# Patient Record
Sex: Male | Born: 1935 | Race: Black or African American | Hispanic: No | Marital: Single | Smoking: Former smoker
Health system: Southern US, Community
[De-identification: ages and names within clinical notes are randomized; demographics above are authoritative.]

## PROBLEM LIST (undated history)

## (undated) ENCOUNTER — Emergency Department (HOSPITAL_COMMUNITY): Admission: EM | Payer: Medicare Other | Source: Home / Self Care

## (undated) DIAGNOSIS — I82409 Acute embolism and thrombosis of unspecified deep veins of unspecified lower extremity: Secondary | ICD-10-CM

## (undated) DIAGNOSIS — I2510 Atherosclerotic heart disease of native coronary artery without angina pectoris: Secondary | ICD-10-CM

## (undated) DIAGNOSIS — R011 Cardiac murmur, unspecified: Secondary | ICD-10-CM

## (undated) DIAGNOSIS — R569 Unspecified convulsions: Secondary | ICD-10-CM

## (undated) DIAGNOSIS — L80 Vitiligo: Secondary | ICD-10-CM

## (undated) DIAGNOSIS — N400 Enlarged prostate without lower urinary tract symptoms: Secondary | ICD-10-CM

## (undated) DIAGNOSIS — Z8669 Personal history of other diseases of the nervous system and sense organs: Secondary | ICD-10-CM

## (undated) DIAGNOSIS — N179 Acute kidney failure, unspecified: Secondary | ICD-10-CM

## (undated) DIAGNOSIS — R197 Diarrhea, unspecified: Secondary | ICD-10-CM

## (undated) DIAGNOSIS — E876 Hypokalemia: Secondary | ICD-10-CM

## (undated) DIAGNOSIS — K449 Diaphragmatic hernia without obstruction or gangrene: Secondary | ICD-10-CM

## (undated) DIAGNOSIS — I4891 Unspecified atrial fibrillation: Secondary | ICD-10-CM

## (undated) DIAGNOSIS — R112 Nausea with vomiting, unspecified: Secondary | ICD-10-CM

## (undated) DIAGNOSIS — F0390 Unspecified dementia without behavioral disturbance: Secondary | ICD-10-CM

## (undated) DIAGNOSIS — D649 Anemia, unspecified: Secondary | ICD-10-CM

## (undated) DIAGNOSIS — I509 Heart failure, unspecified: Secondary | ICD-10-CM

## (undated) DIAGNOSIS — M1990 Unspecified osteoarthritis, unspecified site: Secondary | ICD-10-CM

## (undated) DIAGNOSIS — I10 Essential (primary) hypertension: Secondary | ICD-10-CM

## (undated) HISTORY — DX: Nausea with vomiting, unspecified: R11.2

## (undated) HISTORY — DX: Unspecified osteoarthritis, unspecified site: M19.90

## (undated) HISTORY — DX: Diarrhea, unspecified: R19.7

## (undated) HISTORY — DX: Cardiac murmur, unspecified: R01.1

## (undated) HISTORY — DX: Hypokalemia: E87.6

## (undated) HISTORY — DX: Acute embolism and thrombosis of unspecified deep veins of unspecified lower extremity: I82.409

## (undated) HISTORY — DX: Anemia, unspecified: D64.9

## (undated) HISTORY — DX: Unspecified convulsions: R56.9

## (undated) HISTORY — PX: HERNIA REPAIR: SHX51

## (undated) HISTORY — DX: Diaphragmatic hernia without obstruction or gangrene: K44.9

## (undated) HISTORY — DX: Unspecified atrial fibrillation: I48.91

## (undated) HISTORY — DX: Acute kidney failure, unspecified: N17.9

## (undated) HISTORY — DX: Personal history of other diseases of the nervous system and sense organs: Z86.69

## (undated) HISTORY — DX: Vitiligo: L80

---

## 1999-03-27 ENCOUNTER — Encounter: Payer: Self-pay | Admitting: Nephrology

## 1999-03-27 ENCOUNTER — Encounter: Admission: RE | Admit: 1999-03-27 | Discharge: 1999-03-27 | Payer: Self-pay | Admitting: Nephrology

## 2001-04-16 DIAGNOSIS — K449 Diaphragmatic hernia without obstruction or gangrene: Secondary | ICD-10-CM

## 2001-04-16 HISTORY — DX: Diaphragmatic hernia without obstruction or gangrene: K44.9

## 2003-12-29 ENCOUNTER — Ambulatory Visit: Payer: Self-pay | Admitting: Family Medicine

## 2004-02-01 ENCOUNTER — Ambulatory Visit: Payer: Self-pay | Admitting: Family Medicine

## 2004-03-06 ENCOUNTER — Ambulatory Visit: Payer: Self-pay | Admitting: Family Medicine

## 2004-04-06 ENCOUNTER — Ambulatory Visit: Payer: Self-pay | Admitting: Family Medicine

## 2004-05-08 ENCOUNTER — Ambulatory Visit: Payer: Self-pay | Admitting: Family Medicine

## 2004-05-15 ENCOUNTER — Ambulatory Visit: Payer: Self-pay | Admitting: Family Medicine

## 2004-06-08 ENCOUNTER — Ambulatory Visit: Payer: Self-pay | Admitting: Family Medicine

## 2004-07-07 ENCOUNTER — Ambulatory Visit: Payer: Self-pay | Admitting: Family Medicine

## 2004-08-08 ENCOUNTER — Ambulatory Visit: Payer: Self-pay | Admitting: Internal Medicine

## 2004-09-07 ENCOUNTER — Ambulatory Visit: Payer: Self-pay | Admitting: Internal Medicine

## 2004-10-16 ENCOUNTER — Ambulatory Visit: Payer: Self-pay | Admitting: Internal Medicine

## 2004-11-16 ENCOUNTER — Ambulatory Visit: Payer: Self-pay | Admitting: Family Medicine

## 2004-12-21 ENCOUNTER — Ambulatory Visit: Payer: Self-pay | Admitting: Internal Medicine

## 2005-01-22 ENCOUNTER — Ambulatory Visit: Payer: Self-pay | Admitting: Family Medicine

## 2005-02-23 ENCOUNTER — Ambulatory Visit: Payer: Self-pay | Admitting: Family Medicine

## 2005-03-28 ENCOUNTER — Ambulatory Visit: Payer: Self-pay | Admitting: Internal Medicine

## 2005-04-30 ENCOUNTER — Ambulatory Visit: Payer: Self-pay | Admitting: Family Medicine

## 2005-05-17 ENCOUNTER — Encounter (INDEPENDENT_AMBULATORY_CARE_PROVIDER_SITE_OTHER): Payer: Self-pay | Admitting: Internal Medicine

## 2005-05-17 LAB — CONVERTED CEMR LAB: Microalbumin U total vol: NORMAL mg/L

## 2005-05-31 ENCOUNTER — Ambulatory Visit: Payer: Self-pay | Admitting: Family Medicine

## 2005-06-11 ENCOUNTER — Ambulatory Visit: Payer: Self-pay | Admitting: Family Medicine

## 2005-07-02 ENCOUNTER — Ambulatory Visit: Payer: Self-pay | Admitting: Family Medicine

## 2005-07-16 ENCOUNTER — Encounter (INDEPENDENT_AMBULATORY_CARE_PROVIDER_SITE_OTHER): Payer: Self-pay | Admitting: Internal Medicine

## 2005-08-02 ENCOUNTER — Ambulatory Visit: Payer: Self-pay | Admitting: Family Medicine

## 2005-09-04 ENCOUNTER — Ambulatory Visit: Payer: Self-pay | Admitting: Family Medicine

## 2005-10-04 ENCOUNTER — Ambulatory Visit: Payer: Self-pay | Admitting: Family Medicine

## 2005-11-02 ENCOUNTER — Ambulatory Visit: Payer: Self-pay | Admitting: Family Medicine

## 2005-11-09 ENCOUNTER — Ambulatory Visit: Payer: Self-pay | Admitting: Family Medicine

## 2005-12-06 ENCOUNTER — Ambulatory Visit: Payer: Self-pay | Admitting: Family Medicine

## 2006-01-07 ENCOUNTER — Ambulatory Visit: Payer: Self-pay | Admitting: Internal Medicine

## 2006-02-06 ENCOUNTER — Ambulatory Visit: Payer: Self-pay | Admitting: Family Medicine

## 2006-03-13 ENCOUNTER — Ambulatory Visit: Payer: Self-pay | Admitting: Family Medicine

## 2006-04-11 ENCOUNTER — Ambulatory Visit: Payer: Self-pay | Admitting: Internal Medicine

## 2006-05-13 ENCOUNTER — Ambulatory Visit: Payer: Self-pay | Admitting: Family Medicine

## 2006-06-13 ENCOUNTER — Ambulatory Visit: Payer: Self-pay | Admitting: Family Medicine

## 2006-07-15 ENCOUNTER — Ambulatory Visit: Payer: Self-pay | Admitting: Family Medicine

## 2006-08-12 ENCOUNTER — Ambulatory Visit: Payer: Self-pay | Admitting: Gastroenterology

## 2006-08-13 ENCOUNTER — Encounter (INDEPENDENT_AMBULATORY_CARE_PROVIDER_SITE_OTHER): Payer: Self-pay | Admitting: Internal Medicine

## 2006-08-13 DIAGNOSIS — L80 Vitiligo: Secondary | ICD-10-CM | POA: Insufficient documentation

## 2006-08-13 DIAGNOSIS — E785 Hyperlipidemia, unspecified: Secondary | ICD-10-CM

## 2006-08-13 DIAGNOSIS — D649 Anemia, unspecified: Secondary | ICD-10-CM | POA: Insufficient documentation

## 2006-08-13 DIAGNOSIS — K219 Gastro-esophageal reflux disease without esophagitis: Secondary | ICD-10-CM

## 2006-08-13 DIAGNOSIS — I10 Essential (primary) hypertension: Secondary | ICD-10-CM

## 2006-08-13 DIAGNOSIS — D518 Other vitamin B12 deficiency anemias: Secondary | ICD-10-CM | POA: Insufficient documentation

## 2006-08-13 DIAGNOSIS — G40909 Epilepsy, unspecified, not intractable, without status epilepticus: Secondary | ICD-10-CM

## 2006-08-14 ENCOUNTER — Ambulatory Visit: Payer: Self-pay | Admitting: Family Medicine

## 2006-08-21 ENCOUNTER — Ambulatory Visit: Payer: Self-pay | Admitting: Gastroenterology

## 2006-09-12 ENCOUNTER — Ambulatory Visit: Payer: Self-pay | Admitting: Family Medicine

## 2006-09-30 ENCOUNTER — Encounter (INDEPENDENT_AMBULATORY_CARE_PROVIDER_SITE_OTHER): Payer: Self-pay | Admitting: Family Medicine

## 2006-10-14 ENCOUNTER — Ambulatory Visit: Payer: Self-pay | Admitting: Internal Medicine

## 2006-11-13 ENCOUNTER — Ambulatory Visit: Payer: Self-pay | Admitting: Family Medicine

## 2006-12-02 ENCOUNTER — Ambulatory Visit: Payer: Self-pay | Admitting: Family Medicine

## 2006-12-03 ENCOUNTER — Encounter (INDEPENDENT_AMBULATORY_CARE_PROVIDER_SITE_OTHER): Payer: Self-pay | Admitting: Family Medicine

## 2006-12-03 LAB — CONVERTED CEMR LAB
Alkaline Phosphatase: 128 units/L — ABNORMAL HIGH (ref 39–117)
BUN: 15 mg/dL (ref 6–23)
CO2: 22 meq/L (ref 19–32)
Cholesterol: 155 mg/dL (ref 0–200)
Creatinine, Ser: 1.16 mg/dL (ref 0.40–1.50)
Eosinophils Absolute: 0 10*3/uL (ref 0.0–0.7)
Eosinophils Relative: 0 % (ref 0–5)
Glucose, Bld: 90 mg/dL (ref 70–99)
HCT: 41.7 % (ref 39.0–52.0)
HDL: 56 mg/dL (ref >39)
Hemoglobin: 13.1 g/dL (ref 13.0–17.0)
Lymphocytes Relative: 24 % (ref 12–46)
Lymphs Abs: 1 10*3/uL (ref 0.7–3.3)
MCV: 96.5 fL (ref 78.0–100.0)
Monocytes Absolute: 0.3 10*3/uL (ref 0.2–0.7)
RDW: 14.5 % — ABNORMAL HIGH (ref 11.5–14.0)
Total Bilirubin: 0.4 mg/dL (ref 0.3–1.2)
Total CHOL/HDL Ratio: 2.8
Triglycerides: 78 mg/dL (ref <150)
VLDL: 16 mg/dL (ref 0–40)
WBC: 4.2 10*3/uL (ref 4.0–10.5)

## 2007-01-02 ENCOUNTER — Ambulatory Visit: Payer: Self-pay | Admitting: Family Medicine

## 2007-01-31 ENCOUNTER — Telehealth (INDEPENDENT_AMBULATORY_CARE_PROVIDER_SITE_OTHER): Payer: Self-pay | Admitting: Family Medicine

## 2007-02-03 ENCOUNTER — Encounter (INDEPENDENT_AMBULATORY_CARE_PROVIDER_SITE_OTHER): Payer: Self-pay | Admitting: Family Medicine

## 2007-02-03 ENCOUNTER — Ambulatory Visit: Payer: Self-pay | Admitting: Family Medicine

## 2007-02-03 DIAGNOSIS — K029 Dental caries, unspecified: Secondary | ICD-10-CM | POA: Insufficient documentation

## 2007-02-03 DIAGNOSIS — K0511 Chronic gingivitis, non-plaque induced: Secondary | ICD-10-CM

## 2007-03-06 ENCOUNTER — Ambulatory Visit: Payer: Self-pay | Admitting: Family Medicine

## 2007-04-08 ENCOUNTER — Ambulatory Visit: Payer: Self-pay | Admitting: Family Medicine

## 2007-05-09 ENCOUNTER — Ambulatory Visit: Payer: Self-pay | Admitting: Family Medicine

## 2007-05-26 ENCOUNTER — Encounter (INDEPENDENT_AMBULATORY_CARE_PROVIDER_SITE_OTHER): Payer: Self-pay | Admitting: Family Medicine

## 2007-06-04 ENCOUNTER — Ambulatory Visit: Payer: Self-pay | Admitting: Family Medicine

## 2007-07-02 ENCOUNTER — Ambulatory Visit: Payer: Self-pay | Admitting: Family Medicine

## 2007-08-11 ENCOUNTER — Ambulatory Visit: Payer: Self-pay | Admitting: Family Medicine

## 2007-09-10 ENCOUNTER — Ambulatory Visit: Payer: Self-pay | Admitting: Family Medicine

## 2007-10-13 ENCOUNTER — Ambulatory Visit: Payer: Self-pay | Admitting: Family Medicine

## 2007-11-06 ENCOUNTER — Telehealth (INDEPENDENT_AMBULATORY_CARE_PROVIDER_SITE_OTHER): Payer: Self-pay | Admitting: Family Medicine

## 2007-11-09 ENCOUNTER — Telehealth (INDEPENDENT_AMBULATORY_CARE_PROVIDER_SITE_OTHER): Payer: Self-pay | Admitting: Internal Medicine

## 2007-11-12 ENCOUNTER — Ambulatory Visit: Payer: Self-pay | Admitting: Family Medicine

## 2007-11-12 LAB — CONVERTED CEMR LAB
ALT: 10 units/L (ref 0–53)
Albumin: 4.4 g/dL (ref 3.5–5.2)
Alkaline Phosphatase: 105 units/L (ref 39–117)
Basophils Relative: 0 % (ref 0–1)
Calcium: 9.5 mg/dL (ref 8.4–10.5)
Cholesterol: 178 mg/dL (ref 0–200)
Eosinophils Absolute: 0 10*3/uL (ref 0.0–0.7)
Eosinophils Relative: 0 % (ref 0–5)
Glucose, Bld: 78 mg/dL (ref 70–99)
HCT: 40.2 % (ref 39.0–52.0)
Lymphs Abs: 0.9 10*3/uL (ref 0.7–4.0)
MCHC: 33.3 g/dL (ref 30.0–36.0)
MCV: 90.3 fL (ref 78.0–100.0)
Platelets: 168 10*3/uL (ref 150–400)
RDW: 14.4 % (ref 11.5–15.5)
Sodium: 140 meq/L (ref 135–145)
TSH: 1.724 microintl units/mL (ref 0.350–4.50)
Total Bilirubin: 0.5 mg/dL (ref 0.3–1.2)
Total CHOL/HDL Ratio: 3.2
WBC: 4.5 10*3/uL (ref 4.0–10.5)

## 2007-12-15 ENCOUNTER — Ambulatory Visit: Payer: Self-pay | Admitting: Family Medicine

## 2008-01-14 ENCOUNTER — Ambulatory Visit: Payer: Self-pay | Admitting: Family Medicine

## 2008-02-11 ENCOUNTER — Encounter (INDEPENDENT_AMBULATORY_CARE_PROVIDER_SITE_OTHER): Payer: Self-pay | Admitting: Family Medicine

## 2008-03-04 ENCOUNTER — Ambulatory Visit: Payer: Self-pay | Admitting: Family Medicine

## 2008-04-05 ENCOUNTER — Ambulatory Visit: Payer: Self-pay | Admitting: Family Medicine

## 2008-05-06 ENCOUNTER — Ambulatory Visit: Payer: Self-pay | Admitting: Family Medicine

## 2008-06-07 ENCOUNTER — Ambulatory Visit: Payer: Self-pay | Admitting: Family Medicine

## 2008-06-14 ENCOUNTER — Encounter (INDEPENDENT_AMBULATORY_CARE_PROVIDER_SITE_OTHER): Payer: Self-pay | Admitting: Family Medicine

## 2008-07-05 ENCOUNTER — Ambulatory Visit: Payer: Self-pay | Admitting: Family Medicine

## 2008-08-06 ENCOUNTER — Ambulatory Visit: Payer: Self-pay | Admitting: Family Medicine

## 2008-09-07 ENCOUNTER — Ambulatory Visit: Payer: Self-pay | Admitting: Family Medicine

## 2008-09-25 ENCOUNTER — Emergency Department (HOSPITAL_COMMUNITY): Admission: EM | Admit: 2008-09-25 | Discharge: 2008-09-25 | Payer: Self-pay | Admitting: Family Medicine

## 2008-09-27 ENCOUNTER — Inpatient Hospital Stay (HOSPITAL_COMMUNITY): Admission: EM | Admit: 2008-09-27 | Discharge: 2008-10-01 | Payer: Self-pay | Admitting: Emergency Medicine

## 2008-09-29 ENCOUNTER — Encounter (INDEPENDENT_AMBULATORY_CARE_PROVIDER_SITE_OTHER): Payer: Self-pay | Admitting: Nurse Practitioner

## 2008-10-01 ENCOUNTER — Ambulatory Visit: Payer: Self-pay | Admitting: Infectious Diseases

## 2008-10-07 ENCOUNTER — Telehealth (INDEPENDENT_AMBULATORY_CARE_PROVIDER_SITE_OTHER): Payer: Self-pay | Admitting: Family Medicine

## 2008-10-12 ENCOUNTER — Encounter (INDEPENDENT_AMBULATORY_CARE_PROVIDER_SITE_OTHER): Payer: Self-pay | Admitting: Nurse Practitioner

## 2008-10-21 ENCOUNTER — Ambulatory Visit: Payer: Self-pay | Admitting: Nurse Practitioner

## 2008-10-21 DIAGNOSIS — R269 Unspecified abnormalities of gait and mobility: Secondary | ICD-10-CM

## 2008-10-21 DIAGNOSIS — A029 Salmonella infection, unspecified: Secondary | ICD-10-CM | POA: Insufficient documentation

## 2008-10-21 DIAGNOSIS — R350 Frequency of micturition: Secondary | ICD-10-CM

## 2008-10-22 ENCOUNTER — Encounter (INDEPENDENT_AMBULATORY_CARE_PROVIDER_SITE_OTHER): Payer: Self-pay | Admitting: Nurse Practitioner

## 2008-10-22 LAB — CONVERTED CEMR LAB
AST: 16 units/L (ref 0–37)
Albumin: 3.7 g/dL (ref 3.5–5.2)
BUN: 12 mg/dL (ref 6–23)
CO2: 25 meq/L (ref 19–32)
Calcium: 8.9 mg/dL (ref 8.4–10.5)
Chloride: 108 meq/L (ref 96–112)
Creatinine, Ser: 1.37 mg/dL (ref 0.40–1.50)
Eosinophils Absolute: 0 10*3/uL (ref 0.0–0.7)
Eosinophils Relative: 0 % (ref 0–5)
Glucose, Bld: 97 mg/dL (ref 70–99)
HCT: 31.2 % — ABNORMAL LOW (ref 39.0–52.0)
Hemoglobin: 10.4 g/dL — ABNORMAL LOW (ref 13.0–17.0)
Lymphocytes Relative: 27 % (ref 12–46)
Lymphs Abs: 0.9 10*3/uL (ref 0.7–4.0)
MCV: 88.9 fL (ref 78.0–100.0)
Monocytes Absolute: 0.4 10*3/uL (ref 0.1–1.0)
Monocytes Relative: 12 % (ref 3–12)
PSA: 6.26 ng/mL — ABNORMAL HIGH (ref 0.10–4.00)
Platelets: 216 10*3/uL (ref 150–400)
Potassium: 4.1 meq/L (ref 3.5–5.3)
Vitamin B-12: 431 pg/mL (ref 211–911)
WBC: 3.4 10*3/uL — ABNORMAL LOW (ref 4.0–10.5)

## 2008-10-25 ENCOUNTER — Encounter (INDEPENDENT_AMBULATORY_CARE_PROVIDER_SITE_OTHER): Payer: Self-pay | Admitting: Internal Medicine

## 2008-11-01 ENCOUNTER — Telehealth (INDEPENDENT_AMBULATORY_CARE_PROVIDER_SITE_OTHER): Payer: Self-pay | Admitting: Nurse Practitioner

## 2008-11-22 ENCOUNTER — Encounter (INDEPENDENT_AMBULATORY_CARE_PROVIDER_SITE_OTHER): Payer: Self-pay | Admitting: Nurse Practitioner

## 2008-12-30 ENCOUNTER — Ambulatory Visit: Payer: Self-pay | Admitting: Nurse Practitioner

## 2008-12-30 DIAGNOSIS — R972 Elevated prostate specific antigen [PSA]: Secondary | ICD-10-CM

## 2009-01-13 ENCOUNTER — Ambulatory Visit: Payer: Self-pay | Admitting: Nurse Practitioner

## 2009-01-27 ENCOUNTER — Ambulatory Visit: Payer: Self-pay | Admitting: Nurse Practitioner

## 2009-02-10 ENCOUNTER — Ambulatory Visit: Payer: Self-pay | Admitting: Nurse Practitioner

## 2009-04-05 ENCOUNTER — Telehealth (INDEPENDENT_AMBULATORY_CARE_PROVIDER_SITE_OTHER): Payer: Self-pay | Admitting: Nurse Practitioner

## 2009-04-12 ENCOUNTER — Emergency Department (HOSPITAL_COMMUNITY): Admission: EM | Admit: 2009-04-12 | Discharge: 2009-04-13 | Payer: Self-pay | Admitting: Emergency Medicine

## 2009-04-14 ENCOUNTER — Ambulatory Visit: Payer: Self-pay | Admitting: Physician Assistant

## 2009-04-14 ENCOUNTER — Encounter (INDEPENDENT_AMBULATORY_CARE_PROVIDER_SITE_OTHER): Payer: Self-pay | Admitting: Nurse Practitioner

## 2009-04-14 ENCOUNTER — Ambulatory Visit (HOSPITAL_COMMUNITY): Admission: RE | Admit: 2009-04-14 | Discharge: 2009-04-14 | Payer: Self-pay | Admitting: Internal Medicine

## 2009-04-14 DIAGNOSIS — I4891 Unspecified atrial fibrillation: Secondary | ICD-10-CM | POA: Insufficient documentation

## 2009-04-19 ENCOUNTER — Encounter: Payer: Self-pay | Admitting: Physician Assistant

## 2009-04-29 ENCOUNTER — Encounter (INDEPENDENT_AMBULATORY_CARE_PROVIDER_SITE_OTHER): Payer: Self-pay | Admitting: Nurse Practitioner

## 2009-04-29 ENCOUNTER — Ambulatory Visit: Payer: Self-pay | Admitting: Physician Assistant

## 2009-05-09 ENCOUNTER — Encounter (INDEPENDENT_AMBULATORY_CARE_PROVIDER_SITE_OTHER): Payer: Self-pay | Admitting: Nurse Practitioner

## 2009-05-09 ENCOUNTER — Encounter: Admission: RE | Admit: 2009-05-09 | Discharge: 2009-06-03 | Payer: Self-pay | Admitting: Physician Assistant

## 2009-05-12 ENCOUNTER — Ambulatory Visit: Payer: Self-pay | Admitting: Physician Assistant

## 2009-05-12 ENCOUNTER — Ambulatory Visit: Payer: Self-pay

## 2009-05-12 ENCOUNTER — Encounter: Payer: Self-pay | Admitting: Internal Medicine

## 2009-05-12 ENCOUNTER — Ambulatory Visit (HOSPITAL_COMMUNITY): Admission: RE | Admit: 2009-05-12 | Discharge: 2009-05-12 | Payer: Self-pay | Admitting: Internal Medicine

## 2009-05-12 ENCOUNTER — Ambulatory Visit: Payer: Self-pay | Admitting: Cardiology

## 2009-05-12 ENCOUNTER — Ambulatory Visit: Payer: Self-pay | Admitting: Internal Medicine

## 2009-05-12 LAB — CONVERTED CEMR LAB
Eosinophils Absolute: 0 10*3/uL (ref 0.0–0.7)
Eosinophils Relative: 0 % (ref 0–5)
HCT: 36.2 % — ABNORMAL LOW (ref 39.0–52.0)
Hemoglobin: 11.9 g/dL — ABNORMAL LOW (ref 13.0–17.0)
Lymphocytes Relative: 25 % (ref 12–46)
Lymphs Abs: 1.3 10*3/uL (ref 0.7–4.0)
MCV: 91.4 fL (ref 78.0–100.0)
Monocytes Absolute: 0.4 10*3/uL (ref 0.1–1.0)
Phenobarbital: 11.2 ug/mL — ABNORMAL LOW (ref 15.0–40.0)
RBC Folate: 598 ng/mL (ref 180–600)
RDW: 14.2 % (ref 11.5–15.5)
WBC: 5 10*3/uL (ref 4.0–10.5)

## 2009-05-13 ENCOUNTER — Encounter (INDEPENDENT_AMBULATORY_CARE_PROVIDER_SITE_OTHER): Payer: Self-pay | Admitting: Nurse Practitioner

## 2009-05-16 ENCOUNTER — Encounter: Payer: Self-pay | Admitting: Physician Assistant

## 2009-05-26 ENCOUNTER — Encounter: Payer: Self-pay | Admitting: Physician Assistant

## 2009-05-31 ENCOUNTER — Encounter: Payer: Self-pay | Admitting: Physician Assistant

## 2009-06-29 ENCOUNTER — Encounter (INDEPENDENT_AMBULATORY_CARE_PROVIDER_SITE_OTHER): Payer: Self-pay | Admitting: Nurse Practitioner

## 2009-06-29 DIAGNOSIS — R339 Retention of urine, unspecified: Secondary | ICD-10-CM

## 2009-06-29 DIAGNOSIS — N433 Hydrocele, unspecified: Secondary | ICD-10-CM | POA: Insufficient documentation

## 2009-06-29 DIAGNOSIS — N401 Enlarged prostate with lower urinary tract symptoms: Secondary | ICD-10-CM

## 2009-07-14 ENCOUNTER — Telehealth: Payer: Self-pay | Admitting: Physician Assistant

## 2009-07-14 ENCOUNTER — Telehealth: Payer: Self-pay | Admitting: Internal Medicine

## 2009-08-12 ENCOUNTER — Ambulatory Visit: Payer: Self-pay | Admitting: Physician Assistant

## 2009-08-12 LAB — CONVERTED CEMR LAB
CO2: 23 meq/L (ref 19–32)
Chloride: 108 meq/L (ref 96–112)
Glucose, Bld: 92 mg/dL (ref 70–99)
HCT: 38.3 % — ABNORMAL LOW (ref 39.0–52.0)
Hemoglobin: 12.7 g/dL — ABNORMAL LOW (ref 13.0–17.0)
Lymphocytes Relative: 13 % (ref 12–46)
Lymphs Abs: 0.8 10*3/uL (ref 0.7–4.0)
Monocytes Absolute: 0.5 10*3/uL (ref 0.1–1.0)
Monocytes Relative: 7 % (ref 3–12)
Neutro Abs: 4.9 10*3/uL (ref 1.7–7.7)
Potassium: 3.8 meq/L (ref 3.5–5.3)
RBC: 4.19 M/uL — ABNORMAL LOW (ref 4.22–5.81)
Sodium: 141 meq/L (ref 135–145)

## 2009-08-15 ENCOUNTER — Encounter: Payer: Self-pay | Admitting: Physician Assistant

## 2009-08-16 ENCOUNTER — Telehealth: Payer: Self-pay | Admitting: Physician Assistant

## 2009-11-09 ENCOUNTER — Telehealth: Payer: Self-pay | Admitting: Physician Assistant

## 2009-12-30 ENCOUNTER — Ambulatory Visit: Payer: Self-pay | Admitting: Physician Assistant

## 2009-12-30 DIAGNOSIS — R82998 Other abnormal findings in urine: Secondary | ICD-10-CM

## 2009-12-30 LAB — CONVERTED CEMR LAB
Glucose, Urine, Semiquant: NEGATIVE
Protein, U semiquant: NEGATIVE
Urobilinogen, UA: 0.2

## 2009-12-31 ENCOUNTER — Encounter: Payer: Self-pay | Admitting: Physician Assistant

## 2010-01-02 ENCOUNTER — Encounter: Payer: Self-pay | Admitting: Physician Assistant

## 2010-01-02 ENCOUNTER — Telehealth: Payer: Self-pay | Admitting: Physician Assistant

## 2010-01-02 LAB — CONVERTED CEMR LAB
Bacteria, UA: NONE SEEN
Basophils Relative: 1 % (ref 0–1)
Eosinophils Absolute: 0 10*3/uL (ref 0.0–0.7)
MCHC: 32.7 g/dL (ref 30.0–36.0)
MCV: 93.2 fL (ref 78.0–100.0)
Monocytes Absolute: 0.3 10*3/uL (ref 0.1–1.0)
Monocytes Relative: 6 % (ref 3–12)
Neutrophils Relative %: 69 % (ref 43–77)
RBC: 4.24 M/uL (ref 4.22–5.81)
Squamous Epithelial / LPF: NONE SEEN /lpf

## 2010-01-03 ENCOUNTER — Encounter: Payer: Self-pay | Admitting: Physician Assistant

## 2010-01-06 ENCOUNTER — Encounter: Payer: Self-pay | Admitting: Physician Assistant

## 2010-01-07 ENCOUNTER — Encounter: Payer: Self-pay | Admitting: Physician Assistant

## 2010-01-10 ENCOUNTER — Encounter: Payer: Self-pay | Admitting: Physician Assistant

## 2010-02-28 ENCOUNTER — Ambulatory Visit: Payer: Self-pay | Admitting: Nurse Practitioner

## 2010-02-28 DIAGNOSIS — R634 Abnormal weight loss: Secondary | ICD-10-CM

## 2010-02-28 LAB — CONVERTED CEMR LAB
HDL goal, serum: 40 mg/dL
LDL Goal: 100 mg/dL

## 2010-03-06 ENCOUNTER — Ambulatory Visit: Payer: Self-pay | Admitting: Nurse Practitioner

## 2010-03-06 LAB — CONVERTED CEMR LAB: TSH: 1.73 microintl units/mL (ref 0.350–4.500)

## 2010-03-07 ENCOUNTER — Encounter (INDEPENDENT_AMBULATORY_CARE_PROVIDER_SITE_OTHER): Payer: Self-pay | Admitting: Nurse Practitioner

## 2010-03-23 ENCOUNTER — Telehealth (INDEPENDENT_AMBULATORY_CARE_PROVIDER_SITE_OTHER): Payer: Self-pay | Admitting: Nurse Practitioner

## 2010-03-23 ENCOUNTER — Emergency Department (HOSPITAL_COMMUNITY)
Admission: EM | Admit: 2010-03-23 | Discharge: 2010-03-23 | Payer: Self-pay | Source: Home / Self Care | Admitting: Emergency Medicine

## 2010-03-28 ENCOUNTER — Telehealth (INDEPENDENT_AMBULATORY_CARE_PROVIDER_SITE_OTHER): Payer: Self-pay | Admitting: Nurse Practitioner

## 2010-04-19 ENCOUNTER — Emergency Department (HOSPITAL_COMMUNITY)
Admission: EM | Admit: 2010-04-19 | Discharge: 2010-04-19 | Payer: Self-pay | Source: Home / Self Care | Admitting: Emergency Medicine

## 2010-04-20 ENCOUNTER — Ambulatory Visit
Admission: RE | Admit: 2010-04-20 | Discharge: 2010-04-20 | Payer: Self-pay | Source: Home / Self Care | Attending: Nurse Practitioner | Admitting: Nurse Practitioner

## 2010-04-20 LAB — CONVERTED CEMR LAB

## 2010-05-01 ENCOUNTER — Ambulatory Visit: Admission: RE | Admit: 2010-05-01 | Payer: Self-pay | Source: Home / Self Care | Admitting: Nurse Practitioner

## 2010-05-14 LAB — CONVERTED CEMR LAB
ALT: <8 units/L (ref 0–53)
Alkaline Phosphatase: 116 units/L (ref 39–117)
CO2: 24 meq/L (ref 19–32)
Creatinine, Ser: 1.07 mg/dL (ref 0.40–1.50)
Sodium: 142 meq/L (ref 135–145)
TSH: 2.174 microintl units/mL (ref 0.350–4.500)
Total Bilirubin: 0.4 mg/dL (ref 0.3–1.2)
Total Protein: 6.8 g/dL (ref 6.0–8.3)

## 2010-05-16 NOTE — Letter (Signed)
Summary: *HSN Results Follow up  [facility]  [address] [place], [place] [address]   Phone: [phone]  Fax: [phone]      05/16/2009   [place], [place]   Dear  Mr. [place],                            ____S.Drinkard,FNP   ____D. Gore,FNP       ____B. McPherson,MD   ____V. Rankins,MD    ____E. Mulberry,MD    ____N. Daphine Deutscher, FNP  ____D. Reche Dixon, MD    ____K. Philipp Deputy, MD    __x__S. Alben Spittle, PA-C     This letter is to inform you that your recent test(s):  _______Pap Smear    ___x____Lab Test     _______X-ray    ___x____ is within acceptable limits  _______ requires a medication change  _______ requires a follow-up lab visit  _______ requires a follow-up visit with your provider   Comments:  Blood counts are stable.  Continue your iron and B12.       _________________________________________________________ If you have any questions, please contact our office                     Sincerely,  [place] Newcomer PA-C [facility]

## 2010-05-16 NOTE — Miscellaneous (Signed)
Summary: Rehab Report//DISCHARGE SUMMARY  Rehab Report//DISCHARGE SUMMARY   Imported By: Arta Bruce 08/10/2009 14:41:17  _____________________________________________________________________  External Attachment:    Type:   Image     Comment:   External Document

## 2010-05-16 NOTE — Assessment & Plan Note (Signed)
Summary: FU IN 4 MONTHS WITH [place] Reynolds FOR CPE//GK   Vital Signs:  Patient profile:   77 year old male Height:      74 inches Weight:      205.2 pounds BMI:     26.44 Temp:     97.7 degrees F oral Pulse rate:   78 / minute Pulse rhythm:   regular Resp:     18 per minute BP sitting:   122 / 86  (left arm) Cuff size:   regular  Vitals Entered By: [place] Shannon (December 30, 2009 10:27 AM) CC: cpe Is Patient Diabetic? No Pain Assessment Patient in pain? no       Does patient need assistance? Functional Status Self care Ambulation Normal   Primary Care Provider:  Tereso Newcomer, PA-C  CC:  cpe.  History of Present Illness: Here for CPE.  Doing well.  No complaints.  Just saw urologist.  No new problems.   Pneumovax up to date. Did not understand PHQ9.  Denies depression. Colo done in 2008.  He is over 82.  Will not need any further. He does not need DEXA. Very active.  Walks daily.  No cigs or alcohol. Denies any falls.  Did see PT for balance earlier this year.  Habits & Providers  Alcohol-Tobacco-Diet     Alcohol drinks/day: 0     Tobacco Status: quit  Exercise-Depression-Behavior     Does Patient Exercise: yes     Times/week: 5     Have you felt down or hopeless? no     Have you felt little pleasure in things? no  Current Medications (verified): 1)  Phenobarbital 97.2 Mg Tabs (Phenobarbital) .... One By Mouth At Bedtime 2)  Benazepril Hcl 20 Mg Tabs (Benazepril Hcl) .... Take 1 Tablet By Mouth Once A Day 3)  Vitamin B-12 500 Mcg Tabs (Cyanocobalamin) .... 2 Tabs By Mouth Once Daily 4)  Peridex 0.12 %  Soln (Chlorhexidine Gluconate) .... Swish 15 Cc By Mouth Two Times A Day For Gum Disease 5)  Norvasc 5 Mg Tabs (Amlodipine Besylate) .... One Tablet By Mouth Daily For Blood Pressure 6)  Metoprolol Tartrate 25 Mg Tabs (Metoprolol Tartrate) .... 1/2 By Mouth Two Times A Day 7)  Iferex 150 150 Mg Caps (Polysaccharide Iron Complex) .... Take One Capsule Daily 8)   Pradaxa 150 Mg Caps (Dabigatran Etexilate Mesylate) .... Take One Capsule Two Times A Day 9)  Finasteride 5 Mg Tabs (Finasteride) .... One Tablet By Mouth Daily  Allergies (verified): No Known Drug Allergies  Social History: Smoking Status:  quit Does Patient Exercise:  yes  Review of Systems      See HPI General:  Denies chills and fever. Eyes:  has cataracts. CV:  Denies chest pain or discomfort, fainting, and shortness of breath with exertion. Resp:  Denies cough. GU:  Denies dysuria, incontinence, urinary frequency, and urinary hesitancy. MS:  Denies joint pain. Derm:  Denies lesion(s); +vitiligo. Psych:  Denies depression. Heme:  Denies bleeding.  Physical Exam  General:  alert, well-developed, and well-nourished.   Head:  normocephalic and atraumatic.   Eyes:  pupils equal, pupils round, and pupils reactive to light.   Ears:  R ear normal and L ear normal.   Nose:  no external deformity.   Mouth:  pharynx pink and moist and poor dentition.   Neck:  supple, no thyromegaly, and no JVD.   Chest Wall:  no deformities.   Breasts:  no gynecomastia.   Lungs:  normal breath sounds, no crackles, and no wheezes.   Heart:  normal rate and irregular rhythm.   Abdomen:  soft, non-tender, and no hepatomegaly.   Rectal:  deferred Genitalia:  uncircumcised, no scrotal masses, and no testicular masses or atrophy.   Prostate:  deferred . . . has urologist Msk:  normal ROM.   Extremities:  trace left pedal edema and trace right pedal edema.   multiple varicosities  Neurologic:  alert & oriented X3 and cranial nerves II-XII intact.   Skin:  Extensive large surface areas of de-pigmentation c/w his diagnosis of Vitiligo. Psych:  normally interactive.     Impression & Recommendations:  Problem # 1:  PROSTATE SPECIFIC ANTIGEN, ELEVATED (ICD-790.93) f/u with urology  Problem # 2:  ATRIAL FIBRILLATION (ICD-427.31) rate stable on my count (heart ausc x 1 minute) continue pradaxa and  metoprolol  His updated medication list for this problem includes:    Norvasc 5 Mg Tabs (Amlodipine besylate) ..... One tablet by mouth daily for blood pressure    Metoprolol Tartrate 25 Mg Tabs (Metoprolol tartrate) .Marland Kitchen... 1/2 by mouth two times a day  Problem # 3:  ABNORMALITY OF GAIT (ICD-781.2) doing better after seeing PT  Problem # 4:  HYPERTENSION (ICD-401.9) controlled  His updated medication list for this problem includes:    Benazepril Hcl 20 Mg Tabs (Benazepril hcl) .Marland Kitchen... Take 1 tablet by mouth once a day    Norvasc 5 Mg Tabs (Amlodipine besylate) ..... One tablet by mouth daily for blood pressure    Metoprolol Tartrate 25 Mg Tabs (Metoprolol tartrate) .Marland Kitchen... 1/2 by mouth two times a day  Orders: T-Urinalysis ([id number])  Problem # 5:  ANEMIA-NOS (ICD-285.9)  repeat cbc esp since he is on antiplt therapy  His updated medication list for this problem includes:    Vitamin B-12 500 Mcg Tabs (Cyanocobalamin) .Marland Kitchen... 2 tabs by mouth once daily    Iferex 150 150 Mg Caps (Polysaccharide iron complex) .Marland Kitchen... Take one capsule daily  Orders: T-CBC w/Diff ([id number])  Problem # 6:  SEIZURE DISORDER (ICD-780.39)  check drug levels  His updated medication list for this problem includes:    Phenobarbital 97.2 Mg Tabs (Phenobarbital) ..... One by mouth at bedtime  Orders: T-Phenobarbital ([id number])  Complete Medication List: 1)  Phenobarbital 97.2 Mg Tabs (Phenobarbital) .... One by mouth at bedtime 2)  Benazepril Hcl 20 Mg Tabs (Benazepril hcl) .... Take 1 tablet by mouth once a day 3)  Vitamin B-12 500 Mcg Tabs (Cyanocobalamin) .... 2 tabs by mouth once daily 4)  Peridex 0.12 % Soln (Chlorhexidine gluconate) .... Swish 15 cc by mouth two times a day for gum disease 5)  Norvasc 5 Mg Tabs (Amlodipine besylate) .... One tablet by mouth daily for blood pressure 6)  Metoprolol Tartrate 25 Mg Tabs (Metoprolol tartrate) .... 1/2 by mouth two times a day 7)  Iferex 150 150 Mg  Caps (Polysaccharide iron complex) .... Take one capsule daily 8)  Pradaxa 150 Mg Caps (Dabigatran etexilate mesylate) .... Take one capsule two times a day 9)  Finasteride 5 Mg Tabs (Finasteride) .... One tablet by mouth daily  Other Orders: T-Culture, Urine ([id number]) T- * Misc. Laboratory test [phone])  Patient Instructions: 1)  Please schedule a follow-up appointment in 4 months with Lucky Alverson for Blood Pressure. 2)  Schedule flu shot in 4 weeks.  Laboratory Results   Urine Tests  Date/Time Received: December 30, 2009 10:36 AM   Routine Urinalysis   Glucose: negative   (Normal  Range: Negative) Bilirubin: negative   (Normal Range: Negative) Ketone: negative   (Normal Range: Negative) Spec. Gravity: 1.015   (Normal Range: 1.003-1.035) Blood: trace-intact   (Normal Range: Negative) pH: 6.5   (Normal Range: 5.0-8.0) Protein: negative   (Normal Range: Negative) Urobilinogen: 0.2   (Normal Range: 0-1) Nitrite: negative   (Normal Range: Negative) Leukocyte Esterace: trace   (Normal Range: Negative)

## 2010-05-16 NOTE — Letter (Signed)
Summary: *HSN Results Follow up  [facility]  [address] [place], [place] [address]   Phone: [phone]  Fax: [phone]      04/19/2009   [facility] [address] [place], [place]  [address]   Dear  Mr. [place],                            ____S.Drinkard,FNP   ____D. Gore,FNP       ____B. McPherson,MD   ____V. Rankins,MD    ____E. Mulberry,MD    ____N. Daphine Deutscher, FNP  ____D. Reche Dixon, MD    ____K. Philipp Deputy, MD    __x__S. Alben Spittle, PA-C     This letter is to inform you that your recent test(s):  _______Pap Smear    ___x____Lab Test     ___x____X-ray    ____x___ is within acceptable limits  _______ requires a medication change  _______ requires a follow-up lab visit  _______ requires a follow-up visit with your provider   Comments: Lab tests were ok.  The MRI did not show any recent stroke or blockages in the neck arteries.       _________________________________________________________ If you have any questions, please contact our office                     Sincerely,  [place] Newcomer PA-C [facility]

## 2010-05-16 NOTE — Assessment & Plan Note (Signed)
Summary: Weight loss   Vital Signs:  Patient profile:   77 year old male Weight:      203.8 pounds BMI:     26.26 Temp:     97.8 degrees F oral Pulse rate:   80 / minute Pulse rhythm:   regular Resp:     20 per minute BP sitting:   140 / 90  (left arm) Cuff size:   regular  Vitals Entered By: Levon Hedger (February 28, 2010 11:11 AM)  Nutrition Counseling: Patient's BMI is greater than 25 and therefore counseled on weight management options. CC: check blood levels..pt has lost alot of weight per his sister he has went down 4 pant sizes., Hypertension Management, Lipid Management, Abdominal Pain Is Patient Diabetic? No Pain Assessment Patient in pain? no       Does patient need assistance? Functional Status Self care Ambulation Normal   Primary Care Provider:  Tereso Newcomer, PA-C  CC:  check blood levels..pt has lost alot of weight per his sister he has went down 4 pant sizes., Hypertension Management, Lipid Management, and Abdominal Pain.  History of Present Illness:  Pt into the office today along with his sister.  Weight loss - sister reports that for the past 3 months he has dropped from size 42 to 40 in his pants. Food recall indicates that pt does not eat any pork. They mostly eat at home as opposed to eating at restaurants. Sister reports that soon after he eats then he has diarrhea. No recent travel no recent antibiotics  Colonscopy done in 2008 - normal. sister reports that he goes to GI at routine f/u viit  Pt also maintains routine follow up also with Dr. Donnetta Hail  Dyspepsia History:      He has no alarm features of dyspepsia including no history of melena, hematochezia, dysphagia, persistent vomiting, or involuntary weight loss > 5%.  There is a prior history of GERD.    Hypertension History:      He denies headache, chest pain, and palpitations.  Pt presents today with bp check at home 115/75 pulse 101.  Sister thought this was too low so she stopped  his amlodipine without consulting this office.        Positive major cardiovascular risk factors include male age 24 years old or older, hyperlipidemia, and hypertension.  Negative major cardiovascular risk factors include non-tobacco-user status.    Lipid Management History:      Positive NCEP/ATP III risk factors include male age 24 years old or older and hypertension.  Negative NCEP/ATP III risk factors include non-tobacco-user status.       Medications Prior to Update: 1)  Phenobarbital 97.2 Mg Tabs (Phenobarbital) .... One By Mouth At Bedtime 2)  Benazepril Hcl 20 Mg Tabs (Benazepril Hcl) .... Take 1 Tablet By Mouth Once A Day 3)  Vitamin B-12 500 Mcg Tabs (Cyanocobalamin) .... 2 Tabs By Mouth Once Daily 4)  Peridex 0.12 %  Soln (Chlorhexidine Gluconate) .... Swish 15 Cc By Mouth Two Times A Day For Gum Disease 5)  Norvasc 5 Mg Tabs (Amlodipine Besylate) .... One Tablet By Mouth Daily For Blood Pressure 6)  Metoprolol Tartrate 25 Mg Tabs (Metoprolol Tartrate) .... 1/2 By Mouth Two Times A Day 7)  Iferex 150 150 Mg Caps (Polysaccharide Iron Complex) .... Take One Capsule Daily 8)  Pradaxa 150 Mg Caps (Dabigatran Etexilate Mesylate) .... Take One Capsule Two Times A Day 9)  Finasteride 5 Mg Tabs (Finasteride) .... One Tablet By  Mouth Daily  Current Medications (verified): 1)  Phenobarbital 97.2 Mg Tabs (Phenobarbital) .... One By Mouth At Bedtime 2)  Benazepril Hcl 20 Mg Tabs (Benazepril Hcl) .... Take 1 Tablet By Mouth Once A Day 3)  Vitamin B-12 500 Mcg Tabs (Cyanocobalamin) .... 2 Tabs By Mouth Once Daily 4)  Peridex 0.12 %  Soln (Chlorhexidine Gluconate) .... Swish 15 Cc By Mouth Two Times A Day For Gum Disease 5)  Norvasc 5 Mg Tabs (Amlodipine Besylate) .... One Tablet By Mouth Daily For Blood Pressure 6)  Metoprolol Tartrate 25 Mg Tabs (Metoprolol Tartrate) .... 1/2 By Mouth Two Times A Day 7)  Iferex 150 150 Mg Caps (Polysaccharide Iron Complex) .... Take One Capsule Daily 8)   Pradaxa 150 Mg Caps (Dabigatran Etexilate Mesylate) .... Take One Capsule Two Times A Day 9)  Finasteride 5 Mg Tabs (Finasteride) .... One Tablet By Mouth Daily  Allergies (verified): No Known Drug Allergies  Review of Systems General:  Complains of weight loss; denies fever; sister reports decrease in pant size from 42 to 20 over the past 3 months. CV:  Denies chest pain or discomfort. Resp:  Denies cough. GI:  Complains of diarrhea; denies abdominal pain, bloody stools, nausea, and vomiting. Neuro:  Denies seizures; pt is taking medications as ordered.  Physical Exam  General:  alert.   Head:  normocephalic.   Lungs:  normal breath sounds.   Heart:  normal rate and irregular rhythm.   Neurologic:  alert & oriented X3.   Skin:  vertiligo Psych:  Oriented X3.     Impression & Recommendations:  Problem # 1:  WEIGHT LOSS (ICD-783.21) advised sister that she needs to be sure he is eating low fat foods he has lost 2 pounds since his last visit here. last colonscopy done in 2008 ? diarrhea that just started over the past few weeks. - pt does have hx of samonella; if continues may need to check stools  Problem # 2:  HYPERTENSION (ICD-401.9) advised pt to restart medications and advised not to stop taking without the recommndatoin from the provider His updated medication list for this problem includes:    Benazepril Hcl 20 Mg Tabs (Benazepril hcl) .Marland Kitchen... Take 1 tablet by mouth once a day    Norvasc 5 Mg Tabs (Amlodipine besylate) ..... One tablet by mouth daily for blood pressure    Metoprolol Tartrate 25 Mg Tabs (Metoprolol tartrate) .Marland Kitchen... 1/2 by mouth two times a day  Complete Medication List: 1)  Phenobarbital 97.2 Mg Tabs (Phenobarbital) .... One by mouth at bedtime 2)  Benazepril Hcl 20 Mg Tabs (Benazepril hcl) .... Take 1 tablet by mouth once a day 3)  Vitamin B-12 500 Mcg Tabs (Cyanocobalamin) .... 2 tabs by mouth once daily 4)  Peridex 0.12 % Soln (Chlorhexidine gluconate)  .... Swish 15 cc by mouth two times a day for gum disease 5)  Norvasc 5 Mg Tabs (Amlodipine besylate) .... One tablet by mouth daily for blood pressure 6)  Metoprolol Tartrate 25 Mg Tabs (Metoprolol tartrate) .... 1/2 by mouth two times a day 7)  Iferex 150 150 Mg Caps (Polysaccharide iron complex) .... Take one capsule daily 8)  Pradaxa 150 Mg Caps (Dabigatran etexilate mesylate) .... Take one capsule two times a day 9)  Finasteride 5 Mg Tabs (Finasteride) .... One tablet by mouth daily  Hypertension Assessment/Plan:      The patient's hypertensive risk group is category B: At least one risk factor (excluding diabetes) with no target organ  damage.  His calculated 10 year risk of coronary heart disease is 27 %.  Today's blood pressure is 140/90.  His blood pressure goal is < 140/90.  Lipid Assessment/Plan:      Based on NCEP/ATP III, the patient's risk factor category is "2 or more risk factors and a calculated 10 year CAD risk of > 20%".  The patient's lipid goals are as follows: Total cholesterol goal is 200; LDL cholesterol goal is 100; HDL cholesterol goal is 40; Triglyceride goal is 150.    Patient Instructions: 1)  Schedule an appointment for fasting labs - lipids, TSH 2)  No food after midnight before this visit 3)  Diarrhea - Try to eat low fat foods. 4)  also try to avoid dairy products while you continue to have diarrhea.  This means don't drink too much milk or ice cream. 5)  Blood pressure - restart on ALL your blood pressure medications   Orders Added: 1)  Est. Patient Level III [[address]]

## 2010-05-16 NOTE — Letter (Signed)
Summary: TEST ORDER FORM//MRI//APPT DATE & TIME  TEST ORDER FORM//MRI//APPT DATE & TIME   Imported By: Arta Bruce 05/24/2009 12:04:53  _____________________________________________________________________  External Attachment:    Type:   Image     Comment:   External Document

## 2010-05-16 NOTE — Progress Notes (Signed)
Summary: pradaxa needs called in  Phone Note Call from Patient Call back at Home Phone [phone] Call back at [phone]   Caller: pt Reason for Call: Refill Medication Summary of Call: pt never had Pradaxa called in because he had samples. [facility] [id number] Initial call taken by: Faythe Ghee,  July 14, 2009 9:34 AM    New/Updated Medications: PRADAXA 150 MG CAPS (DABIGATRAN ETEXILATE MESYLATE) take one capsule two times a day Prescriptions: PRADAXA 150 MG CAPS (DABIGATRAN ETEXILATE MESYLATE) take one capsule two times a day  #60 x 1   Entered by:   Judithe Modest CMA   Authorized by:   Nathen May, MD, [facility]   Signed by:   Judithe Modest CMA on 07/14/2009   Method used:   Electronically to        [facility] (retail)       [address]       [place], [place]  [id number]       Ph: [phone]       Fax: [phone]   RxID:   [id number]

## 2010-05-16 NOTE — Letter (Signed)
Summary: Generic Letter  [facility]  [address] [place], [place] [address]   Phone: [phone]  Fax: [phone]    01/03/2010  To whom it may concern:  [place] (DOB 11-30-1925) is my patient.  He has been on Phenobarbital 97.2 mg once daily for many years for treatment of his seizure disorder.  He has been my patient since Harvest of 2010.  He was placed on this medicine long before I started seeing him.  He has been stable on this medication and has had no evidence of seizures since at least 2009.  His prior authorization for this medicine was recently denied.  His reference # is [id number].    Since [name] is stable on this medicine and it is preventing his seizures effectively, I believe he should be continued on it.  I request that you continue to cover it for him.  I am concerned that changing seizure medicines in an octogenarian who is currently stable could result in a seizure that may be detrimental.         Sincerely,   Tereso Newcomer PA-C

## 2010-05-16 NOTE — Letter (Signed)
Summary: *HSN Results Follow up  [facility]  [address] [place], [place] [address]   Phone: [phone]  Fax: [phone]      08/15/2009   [facility] [address] [place], [place]  [address]   Dear  Mr. JEDRICK Victorio,                            ____S.Drinkard,FNP   ____D. Gore,FNP       ____B. McPherson,MD   ____V. Rankins,MD    ____E. Mulberry,MD    ____N. Daphine Deutscher, FNP  ____D. Reche Dixon, MD    ____K. Philipp Deputy, MD    __x__S. Alben Spittle, PA-C   This letter is to inform you that your recent test(s):  _______Pap Smear    ___x____Lab Test     _______X-ray    ___x____ is within acceptable limits  _______ requires a medication change  _______ requires a follow-up lab visit  _______ requires a follow-up visit with your provider   Comments:       _________________________________________________________ If you have any questions, please contact our office                     Sincerely,  Tereso Newcomer PA-C [facility]

## 2010-05-16 NOTE — Miscellaneous (Signed)
Summary: Rehab Report/INITIAL SUMMARY  Rehab Report/INITIAL SUMMARY   Imported By: Arta Bruce 07/11/2009 15:17:54  _____________________________________________________________________  External Attachment:    Type:   Image     Comment:   External Document

## 2010-05-16 NOTE — Medication Information (Signed)
Summary: RX Folder//PRESCRIPTION SOLUTIONS  RX Folder//PRESCRIPTION SOLUTIONS   Imported By: Arta Bruce 02/10/2010 11:44:06  _____________________________________________________________________  External Attachment:    Type:   Image     Comment:   External Document

## 2010-05-16 NOTE — Letter (Signed)
Summary: [employer]  [employer]   Imported By: Arta Bruce 02/01/2010 12:01:50  _____________________________________________________________________  External Attachment:    Type:   Image     Comment:   External Document

## 2010-05-16 NOTE — Assessment & Plan Note (Signed)
Summary: F/U 3 MONTHS  PER NURSE / NS   Vital Signs:  Patient profile:   77 year old male Weight:      208.8 pounds Temp:     98.0 degrees F oral Pulse rate:   74 / minute Pulse rhythm:   regular Resp:     18 per minute BP sitting:   132 / 82  (left arm) Cuff size:      regular  Vitals Entered ByGeanie Cooley  (May 12, 2009 3:57 PM) CC: Pt here for his followup. Pt states that he hasnt been having any problems or issues. Pt states he had a visit with the Cardiologist this morning and Dr. Graciela Husbands took him off the aspirin. Pt states the cardiologist wanted him to try Pradaxa instead of coumadin to see if it works better. Pt will return to the cardiologist in 3weeks, Hypertension Management Pain Assessment Patient in pain? no       Does patient need assistance? Functional Status Self care Ambulation Normal   Primary Care Provider:  Tereso Newcomer, PA-C  CC:  Pt here for his followup. Pt states that he hasnt been having any problems or issues. Pt states he had a visit with the Cardiologist this morning and Dr. Graciela Husbands took him off the aspirin. Pt states the cardiologist wanted him to try Pradaxa instead of coumadin to see if it works better. Pt will return to the cardiologist in 3weeks and Hypertension Management.  History of Present Illness: 77 year old male who returns for followup.  The patient was seen by Dr. Graciela Husbands today to evaluate his atrial fibrillation.  After a long discussion it was decided to place the patient on dabigatran.  Of note, the patient noticed that his gait is much more steady since changing his blood pressure medicine.  He doesn't seem to have a better gait in the office.  He has been set up with physical therapy for balance training.  I agree with Dr. Graciela Husbands in that his fall risk is not that great.  I think he has improved with adjustments in his hypertensive regimen.  He seems to be comfortable with taking dabigatrin.  In reviewing his records he does  have a history of anemia.  This has not been assessed in several months.  He is now on p.o. B12 as well as iron therapy.  He has chronically elevated PSA levels.  He does followup with urology in [place].  Hypertension History:      He denies chest pain, palpitations, dyspnea with exertion, and syncope.        Positive major cardiovascular risk factors include male age 41 years old or older, hyperlipidemia, and hypertension.  Negative major cardiovascular risk factors include non-tobacco-user status.     Allergies (verified): No Known Drug Allergies  Physical Exam  General:  alert, well-developed, and well-nourished.   Head:  normocephalic and atraumatic.   Neck:  supple.   Lungs:  normal breath sounds, no crackles, and no wheezes.   Heart:  normal rate and irregular rhythm.   Extremities:  no edema Neurologic:  alert & oriented X3 and cranial nerves II-XII intact.   Psych:  normally interactive.     Impression & Recommendations:  Problem # 1:  ATRIAL FIBRILLATION (ICD-427.31) continue followup with cardiology as indicated Rate well controlled He is now off of aspirin and on dabigatrin  His updated medication list for this problem includes:    Norvasc 5 Mg Tabs (Amlodipine besylate) ..... One tablet  by mouth daily for blood pressure    Metoprolol Tartrate 25 Mg Tabs (Metoprolol tartrate) .Marland Kitchen... 1/2 by mouth two times a day  Problem # 2:  PROSTATE SPECIFIC ANTIGEN, ELEVATED (ICD-790.93) continue followup with urology  Problem # 3:  ANEMIA-NOS (ICD-285.9)  check followup CBC today as well as B12 and folate levels His updated medication list for this problem includes:    Vitamin B-12 500 Mcg Tabs (Cyanocobalamin) .Marland Kitchen... 2 tabs by mouth once daily    Iferex 150 150 Mg Caps (Polysaccharide iron complex) .Marland Kitchen... Take one capsule daily  Orders: T-CBC w/Diff (16109-60454) T-Folic Acid; RBC (09811-91478) T-Vitamin B12 (29562-13086)  Problem # 4:  HYPERTENSION  (ICD-401.9) well-controlled  His updated medication list for this problem includes:    Benazepril Hcl 20 Mg Tabs (Benazepril hcl) .Marland Kitchen... Take 1 tablet by mouth once a day    Norvasc 5 Mg Tabs (Amlodipine besylate) ..... One tablet by mouth daily for blood pressure    Metoprolol Tartrate 25 Mg Tabs (Metoprolol tartrate) .Marland Kitchen... 1/2 by mouth two times a day  Problem # 5:  SEIZURE DISORDER (ICD-780.39)  check phenobarbital level  His updated medication list for this problem includes:    Phenobarbital 97.2 Mg Tabs (Phenobarbital) ..... One by mouth at bedtime  Orders: T-Phenobarbital (57846-96295)  Complete Medication List: 1)  Phenobarbital 97.2 Mg Tabs (Phenobarbital) .... One by mouth at bedtime 2)  Benazepril Hcl 20 Mg Tabs (Benazepril hcl) .... Take 1 tablet by mouth once a day 3)  Vitamin B-12 500 Mcg Tabs (Cyanocobalamin) .... 2 tabs by mouth once daily 4)  Peridex 0.12 % Soln (Chlorhexidine gluconate) .... Swish 15 cc by mouth two times a day for gum disease 5)  Norvasc 5 Mg Tabs (Amlodipine besylate) .... One tablet by mouth daily for blood pressure 6)  Metoprolol Tartrate 25 Mg Tabs (Metoprolol tartrate) .... 1/2 by mouth two times a day 7)  Iferex 150 150 Mg Caps (Polysaccharide iron complex) .... Take one capsule daily 8)  Pradaxa 150mg  Caps  .... Two times a day  Hypertension Assessment/Plan:      The patient's hypertensive risk group is category B: At least one risk factor (excluding diabetes) with no target organ damage.  His calculated 10 year risk of coronary heart disease is 22 %.  Today's blood pressure is 132/82.  His blood pressure goal is < 140/90.  Patient Instructions: 1)  Please schedule a follow-up appointment in 3 months with Kesleigh Morson for BP.  2)  The medication list was reviewed and reconciled.  All changed / newly prescribed medications were explained.  A complete medication list was provided to the patient / caregiver.  In a  Vital Signs:  Patient profile:    77 year old male Weight:      208.8 pounds Temp:     98.0 degrees F oral Pulse rate:   74 / minute Pulse rhythm:   regular Resp:     18 per minute BP sitting:   132 / 82  (left arm) Cuff size:      regular  Vitals Entered ByGeanie Cooley  (May 12, 2009 3:57 PM)

## 2010-05-16 NOTE — Letter (Signed)
Summary: ECHO REPORT  ECHO REPORT   Imported By: Arta Bruce 07/06/2009 15:57:37  _____________________________________________________________________  External Attachment:    Type:   Image     Comment:   External Document

## 2010-05-16 NOTE — Progress Notes (Signed)
Summary: Do you want me to refill?  Phone Note Outgoing Call   Summary of Call: Do you want his amlodipine refilled?  Last refill was dated in October x3 refills; last visit was in April.  Scheduled to see you in August. Initial call taken by: Dutch Quint RN,  November 09, 2009 2:52 PM  Follow-up for Phone Call        Yes . . . refill  Make sure he has been taking. If not, let me know.  Refill request states he last filled this month. Follow-up by: Tereso Newcomer PA-C,  November 09, 2009 5:40 PM  Additional Follow-up for Phone Call Additional follow up Details #1::        Left message on answering machine to return call.  Dutch Quint RN  November 10, 2009 10:33 AM  Pt.'s sister states that he has definitely been taking the amlodipine as ordered.  Dutch Quint RN  November 10, 2009 12:55 PM

## 2010-05-16 NOTE — Letter (Signed)
Summary: Lipid Letter  [facility]  [address] [place], [place] [address]   Phone: [phone]  Fax: [phone]    03/07/2010  Claude Waldman [address] [place], [place]  [address]  Dear Chrissie Noa:  We have carefully reviewed your last lipid profile from 03/06/2010 and the results are noted below with a summary of recommendations for lipid management.    Cholesterol:       156     Goal: less than 200   HDL "good" Cholesterol:   56     Goal: greater than 40   LDL "bad" Cholesterol:   88     Goal: less than 100   Triglycerides:       58     Goal: less than 150    labs done during recent office visit are normal.  Your cholesterol level is good.    Current Medications: 1)    Phenobarbital 97.2 Mg Tabs (Phenobarbital) .... One by mouth at bedtime 2)    Benazepril Hcl 20 Mg Tabs (Benazepril hcl) .... Take 1 tablet by mouth once a day 3)    Vitamin B-12 500 Mcg Tabs (Cyanocobalamin) .... 2 tabs by mouth once daily 4)    Peridex 0.12 %  Soln (Chlorhexidine gluconate) .... Swish 15 cc by mouth two times a day for gum disease 5)    Norvasc 5 Mg Tabs (Amlodipine besylate) .... One tablet by mouth daily for blood pressure 6)    Metoprolol Tartrate 25 Mg Tabs (Metoprolol tartrate) .... 1/2 by mouth two times a day 7)    Iferex 150 150 Mg Caps (Polysaccharide iron complex) .... Take one capsule daily 8)    Pradaxa 150 Mg Caps (Dabigatran etexilate mesylate) .... Take one capsule two times a day 9)    Finasteride 5 Mg Tabs (Finasteride) .... One tablet by mouth daily  If you have any questions, please call. We appreciate being able to work with you.   Sincerely,    Lehman Prom, FNP [facility]

## 2010-05-16 NOTE — Progress Notes (Signed)
Summary: PHENOBARBITOL NEEDS PRIOR APPROVAL  Phone Note Call from Patient Call back at Home Phone [phone]   Caller: ANN-SISTER Reason for Call: Refill Medication Summary of Call: Dariah Mcsorley PT. [facility] IS OUT OF HIS PHENOBARBITOL AND THE PHARMACY SAYS THAT IT NEEDS PRIOR APPROVAL.  HE USES [facility] [address]. Initial call taken by: Leodis Rains,  July 14, 2009 3:35 PM  Follow-up for Phone Call        forward to provider Follow-up by: Armenia Shannon,  July 15, 2009 3:03 PM  Additional Follow-up for Phone Call Additional follow up Details #1::        Rx in basket to be faxed Additional Follow-up by: Brynda Rim,  July 15, 2009 3:09 PM    Additional Follow-up for Phone Call Additional follow up Details #2::    rx faxed Follow-up by: Armenia Shannon,  July 18, 2009 6:31 PM  Prescriptions: PHENOBARBITAL 97.2 MG TABS (PHENOBARBITAL) One by mouth at bedtime  #30 x 5   Entered and Authorized by:   Tereso Newcomer PA-C   Signed by:   Tereso Newcomer PA-C on 07/15/2009   Method used:   Printed then faxed to ...       [facility] (retail)       [address]       [place], [place]  [id number]       Ph: [phone]       Fax: [phone]   RxID:   [id number]

## 2010-05-16 NOTE — Progress Notes (Signed)
Summary: Refills Request  Phone Note Call from Patient Call back at Home Phone [phone]   Summary of Call: The pt needs more refils from his iperex medication.  [facility] [place] [phone]) Alben Spittle PA-c Initial call taken by: Manon Hilding,  Aug 16, 2009 3:03 PM  Follow-up for Phone Call        forward to provider Follow-up by: Armenia Shannon,  Aug 18, 2009 9:49 AM  Additional Follow-up for Phone Call Additional follow up Details #1::        Not sure what that is. I just filled his Iron tablets on 4/1 with 5 refills. Find out what med he needs. Additional Follow-up by: Brynda Rim,  Aug 18, 2009 2:08 PM    Additional Follow-up for Phone Call Additional follow up Details #2::    pt's sister is aware Follow-up by: Armenia Shannon,  Aug 18, 2009 2:45 PM

## 2010-05-16 NOTE — Miscellaneous (Signed)
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

## 2010-05-16 NOTE — Letter (Signed)
Summary: *HSN Results Follow up  [facility]  [address] [place], [place] [address]   Phone: [phone]  Fax: [phone]      01/02/2010   [facility] [address] [place], [place]  [address]   Dear  Mr. NICHOLLAS Brouwer,                            ____S.Drinkard,FNP   ____D. Gore,FNP       ____B. McPherson,MD   ____V. Rankins,MD    ____E. Mulberry,MD    ____N. Daphine Deutscher, FNP  ____D. Reche Dixon, MD    ____K. Philipp Deputy, MD    __x__S. Alben Spittle, PA-C     This letter is to inform you that your recent test(s):  _______Pap Smear    ___x____Lab Test     _______X-ray    ___x____ is within acceptable limits  _______ requires a medication change  _______ requires a follow-up lab visit  _______ requires a follow-up visit with your provider   Comments:       _________________________________________________________ If you have any questions, please contact our office                     Sincerely,  Tereso Newcomer PA-C [facility]

## 2010-05-16 NOTE — Progress Notes (Signed)
Summary: Denied PA for phenobarbital  Phone Note Outgoing Call   Summary of Call: PA for phenobarbital was denied - not covered under Medicare Part-D Rx drug plan. See folder on your desk. Initial call taken by: Dutch Quint RN,  January 02, 2010 11:30 AM  Follow-up for Phone Call        I wrote a letter. Please fax letter and paperwork to Part D Appeals Dept. Fax # [phone] WIll put on your desk. Tereso Newcomer PA-C  January 03, 2010 3:12 PM  Papers faxed as requested.  Dutch Quint RN  January 03, 2010 3:32 PM  Still waiting for response -- refaxed papers.  Dutch Quint RN  January 10, 2010 10:54 AM   Additional Follow-up for Phone Call Additional follow up Details #1::        Advised per Derinda Bartus that med was again denied.  Review in progress.  Dutch Quint RN  January 13, 2010 4:19 PM     Additional Follow-up for Phone Call Additional follow up Details #2::    Please follow up with Medicaid to find out what is being done about his Phenobarbital. Brynda Rim,  January 24, 2010 12:54 PM  Spoke with  [employer] PA Department for f/u information [phone].  Rep Rinaldo Cloud states that appeals are still in the system, but apparently hasn't been determined as of yet.  She is going to check with another person in her office to see why our appeal is in the system, but doesn't seem to be going anywhere.  Dutch Quint RN  January 24, 2010 3:12 PM  Appeal resubmitted to [employer] -- Elita Quick in [place] dept. states that she will contact us by Friday to let us know status.  Also, that phenobarbital is $12.99 out-of-pocket if he doesn't run it through insurance.  Dutch Quint RN  January 25, 2010 9:51 AM  Please see if patient can just pay for it out of pocket at $12.99 a month.  Tereso Newcomer PA-C  January 25, 2010 12:39 PM   Left message on answering machine for pt. to return call.  Dutch Quint RN  January 26, 2010 12:53 PM   Additional Follow-up  for Phone Call Additional follow up Details #3:: Details for Additional Follow-up Action Taken: Pt.'s sister advised of continuing PA process and out-of-pocket cost for medication.  States "we'll just have to pay for it."  Advised that we'd let her know if denial reversed; until then, he'll need to pay for it.  Verbalized understanding. Dutch Quint RN  January 26, 2010 4:14 PM  I rec'd letter from [facility] . Marland Kitchen Marland KitchenAppeal denied (dated 9.24.2011). He will have to pay out of pocket for med for now as his insurance will not cover. Tereso Newcomer PA-C  February 01, 2010 11:09 AM

## 2010-05-16 NOTE — Letter (Signed)
Summary: [facility]   Imported By: Arta Bruce 08/26/2009 15:47:33  _____________________________________________________________________  External Attachment:    Type:   Image     Comment:   External Document

## 2010-05-16 NOTE — Letter (Signed)
Summary: PHYSICIAN QUERY FORM  PHYSICIAN QUERY FORM   Imported By: Arta Bruce 07/01/2009 14:42:59  _____________________________________________________________________  External Attachment:    Type:   Image     Comment:   External Document

## 2010-05-16 NOTE — Medication Information (Signed)
Summary: RX Folder//[employer]//PHARMACY APPEALS  RX Folder//[employer]//PHARMACY APPEALS   Imported By: Arta Bruce 02/02/2010 11:19:15  _____________________________________________________________________  External Attachment:    Type:   Image     Comment:   External Document

## 2010-05-16 NOTE — Assessment & Plan Note (Signed)
Summary: nep/afib   Primary Provider:  Tereso Newcomer, PA-C  CC:  new patient afib with reports of loss of balance.  Pt feels it was medication induced and since the medication has been reduced he no longer has issues with balance.Marland Kitchen  History of Present Illness: Donald Reynolds is seen at the request of Tereso Newcomer from [facility] for atrial fibrillation that was identified without associated symptoms.  The patient is an 77 year old gentleman with hypertension with occasional problems with falls and balance was noted at his evaluation had atrial fibrillation. He denies palpitations. He denies changes in exercise tolerance. He has had no edema. He has no chest discomfort.  He is not aware of his prior stroke that was identified as part of evaluation for his balance. 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 Questioning him today with his sister present they both denies significant problems with falls or major balance. There is some gait instability.  Cardiac evaluation here today included an ultrasound done in our office this morning that was nearly showed normal left ventricular function mild left atrial enlargement and mild mitral regurgitation.  Current Medications (verified): 1)  Phenobarbital 97.2 Mg Tabs (Phenobarbital) .... One By Mouth At Bedtime 2)  Benazepril Hcl 20 Mg Tabs (Benazepril Hcl) .... Take 1 Tablet By Mouth Once A Day 3)  Vitamin B-12 Cr 1000 Mcg Tbcr (Cyanocobalamin) .... Monthly 4)  Peridex 0.12 %  Soln (Chlorhexidine Gluconate) .... Swish 15 Cc By Mouth Two Times A Day For Gum Disease 5)  Norvasc 5 Mg Tabs (Amlodipine Besylate) .... One Tablet By Mouth Daily For Blood Pressure 6)  Metoprolol Tartrate 25 Mg Tabs (Metoprolol Tartrate) .... 1/2 By Mouth Two Times A Day 7)  Bayer Aspirin 325 Mg Tabs (Aspirin) .... Take 1 Tablet By Mouth Once A Day 8)  Iferex 150 150 Mg Caps  (Polysaccharide Iron Complex) .... Take One Capsule Daily  Allergies (verified): No Known Drug Allergies  Past History:  Past Medical History: Last updated: 05/11/2009 Mental Retardation  319.0 Anemia-NOS GERD Hypertension Hyperlipidemia B12 deficiency Vitiligo. History of seizure disorder  Past Surgical History: Last updated: 08/13/2006 Inguinal herniorrhaphy  Family History: Last updated: 05/12/2009 Negative FH of Diabetes, Hypertension, or Coronary Artery Disease  Social History: Last updated: 05/11/2009 Still working with [facility] to help...works in Electronics engineer of garden). Alcohol use-no Drug use-no Distant  h/o smoker...smoked one year only.  Family History: Negative FH of Diabetes, Hypertension, or Coronary Artery Disease  Review of Systems       full review of systems was negative apart from a history of present illness and past medical history.   Vital Signs:  Patient profile:   77 year old male Height:      74 inches Weight:      208 pounds BMI:     26.80 Pulse rate:   65 / minute Pulse rhythm:   irregular BP sitting:   134 / 86  (left arm) Cuff size:   regular  Vitals Entered By: Judithe Modest CMA (May 12, 2009 10:22 AM)  Physical Exam  General:  Well developed, well nourished, in no acute distress.his elderly African American male in no acute distress appearing his stated age Head:  normocephalic and atraumatic Mouth:  Dentition is poor there is significant gingival swelling Neck:  Neck supple, no JVD. No masses, thyromegaly or abnormal cervical nodes. Chest Wall:  no deformities  or breast masses noted Lungs:  clear to auscultation and percussion Heart:  irregular rhythm without significant murmurs or gallops Abdomen:  soft nontender somewhat protuberant. Bowel sounds are present there is no hepatomegaly and no midline pulsations was noted Msk:  Back normal, normal gait. Muscle strength and tone normal. Pulses:  pulses normal  in all 4 extremities Extremities:  No clubbing or cyanosis. Neurologic:  Alert and oriented x 3. otherwise grossly normal Skin:  patchy albinism Cervical Nodes:  no significant adenopathy  Axillary Nodes:   Psych:  Normal affect.   EKG  Procedure date:  05/12/2009  Findings:      Atrial fibrillation  LAD -/..08?.40 LAD   Impression & Recommendations:  Problem # 1:  ATRIAL FIBRILLATION (ICD-427.31) pts atrial fibrillationis slargely asymoptpmatic and he is adequately rate controlled. Echo today demonstated near normal left ventricular function.  He has a CHADS score of 4(age-27, Htn-1, stroke-2) and I agree tjat OAC is appropriate.  I am not convinced that he is at high risk  to the point that he would not tolerate or risks would be exorbitatnt.  We had a lengthy discussion regarding the relative merits of Coumadin versus Pradaxa. These included a relative benefits as well as risks. The patient would like to begin on Pradaxa. This discussion took greater than 10 minutes  The following medications were removed from the medication list:    Bayer Aspirin 325 Mg Tabs (Aspirin) .Marland Kitchen... Take 1 tablet by mouth once a day His updated medication list for this problem includes:    Metoprolol Tartrate 25 Mg Tabs (Metoprolol tartrate) .Marland Kitchen... 1/2 by mouth two times a day  Orders: EKG w/ Interpretation (93000)  Problem # 2:  HYPERTENSION (ICD-401.9) reasonabl;y controlled at present The following medications were removed from the medication list:    Bayer Aspirin 325 Mg Tabs (Aspirin) .Marland Kitchen... Take 1 tablet by mouth once a day His updated medication list for this problem includes:    Benazepril Hcl 20 Mg Tabs (Benazepril hcl) .Marland Kitchen... Take 1 tablet by mouth once a day    Norvasc 5 Mg Tabs (Amlodipine besylate) ..... One tablet by mouth daily for blood pressure    Metoprolol Tartrate 25 Mg Tabs (Metoprolol tartrate) .Marland Kitchen... 1/2 by mouth two times a day  Patient Instructions: 1)  Your physician has  recommended you make the following change in your medication: STOP ASPIRIN 2)  START PRADAXA 150MG  two times a day  3)  Your physician recommends that you CONTINUE TO follow-up WITH SCOTT WEAVER-PA C

## 2010-05-16 NOTE — Letter (Signed)
Summary: [facility]  [facility]   Imported By: Kassie Mends 07/04/2009 09:36:25  _____________________________________________________________________  External Attachment:    Type:   Image     Comment:   External Document

## 2010-05-16 NOTE — Letter (Signed)
Summary: REFERRAL//PHYSICAL THERAPY  REFERRAL//PHYSICAL THERAPY   Imported By: Arta Bruce 06/20/2009 12:09:38  _____________________________________________________________________  External Attachment:    Type:   Image     Comment:   External Document

## 2010-05-16 NOTE — Assessment & Plan Note (Signed)
Summary: follow up in 3 months with Moncerrat Burnstein for bp//gk   Vital Signs:  Patient profile:   77 year old male Height:      74 inches Weight:      208 pounds BMI:     26.80 Temp:     98.1 degrees F oral Pulse rate:   77 / minute Pulse rhythm:   regular Resp:     20 per minute BP sitting:   137 / 84  (left arm) Cuff size:   regular  Vitals Entered By: Armenia Shannon (August 12, 2009 8:27 AM)  Serial Vital Signs/Assessments:  Time      Position  BP       Pulse  Resp  Temp     By 8:53 AM             132/80                         Tereso Newcomer PA-C  Is Patient Diabetic? No Pain Assessment Patient in pain? no       Does patient need assistance? Functional Status Self care Ambulation Normal   Primary Care Provider:  Tereso Newcomer, PA-C   History of Present Illness: Here for f/u. Denies any complaints.  No chest pain or sob.  No headaches.  No blurry vision.  No orthopnea or PND.  No lightheadedness or near syncope.   Denies tachypalps. Did go see PT and feels like his balance is improved.  No reported falls. No h/o seizures in many years. He has been involved in the [employer].  He has done football throw, etc.  He has one more game to go.   Problems Prior to Update: 1)  Hydrocele, Right  (ICD-603.9) 2)  Incomplete Voiding  (ZOX-096.04) 3)  Benign Prostatic Hypertrophy, With Urinary Obstruction  (ICD-600.01) 4)  Atrial Fibrillation  (ICD-427.31) 5)  Prostate Specific Antigen, Elevated  (ICD-790.93) 6)  Hx of Salmonella Infection  (ICD-003.9) 7)  Frequency, Urinary  (ICD-788.41) 8)  Abnormality of Gait  (ICD-781.2) 9)  Caries, Dental, Unspecified  (ICD-521.00) 10)  Gingivitis, Chronic, Non-plaque Induced  (ICD-523.11) 11)  Hyperlipidemia  (ICD-272.4) 12)  Hypertension  (ICD-401.9) 13)  Gerd  (ICD-530.81) 14)  Anemia-nos  (ICD-285.9) 15)  Seizure Disorder  (ICD-780.39) 16)  Vitiligo  (ICD-709.01) 17)  Hypertension, Benign Essential  (ICD-401.1) 18)  Anemia, B12  Deficiency  (ICD-281.1)  Current Medications (verified): 1)  Phenobarbital 97.2 Mg Tabs (Phenobarbital) .... One By Mouth At Bedtime 2)  Benazepril Hcl 20 Mg Tabs (Benazepril Hcl) .... Take 1 Tablet By Mouth Once A Day 3)  Vitamin B-12 500 Mcg Tabs (Cyanocobalamin) .... 2 Tabs By Mouth Once Daily 4)  Peridex 0.12 %  Soln (Chlorhexidine Gluconate) .... Swish 15 Cc By Mouth Two Times A Day For Gum Disease 5)  Norvasc 5 Mg Tabs (Amlodipine Besylate) .... One Tablet By Mouth Daily For Blood Pressure 6)  Metoprolol Tartrate 25 Mg Tabs (Metoprolol Tartrate) .... 1/2 By Mouth Two Times A Day 7)  Iferex 150 150 Mg Caps (Polysaccharide Iron Complex) .... Take One Capsule Daily 8)  Pradaxa 150 Mg Caps (Dabigatran Etexilate Mesylate) .... Take One Capsule Two Times A Day 9)  Finasteride 5 Mg Tabs (Finasteride) .... One Tablet By Mouth Daily  Allergies (verified): No Known Drug Allergies  Past History:  Past Medical History: Last updated: 05/11/2009 Mental Retardation  319.0 Anemia-NOS GERD Hypertension Hyperlipidemia B12 deficiency Vitiligo. History of seizure disorder  Physical Exam  General:  alert, well-developed, and well-nourished.   Head:  normocephalic and atraumatic.   Neck:  supple.   Lungs:  normal breath sounds.   Heart:  normal rate and irregular rhythm.   Abdomen:  soft and non-tender.   Neurologic:  alert & oriented X3 and cranial nerves II-XII intact.   Psych:  normally interactive.     Impression & Recommendations:  Problem # 1:  HYPERTENSION (ICD-401.9)  controlled  His updated medication list for this problem includes:    Benazepril Hcl 20 Mg Tabs (Benazepril hcl) .Marland Kitchen... Take 1 tablet by mouth once a day    Norvasc 5 Mg Tabs (Amlodipine besylate) ..... One tablet by mouth daily for blood pressure    Metoprolol Tartrate 25 Mg Tabs (Metoprolol tartrate) .Marland Kitchen... 1/2 by mouth two times a day  Orders: T-Basic Metabolic Panel [id number])  Problem # 2:   HYPERLIPIDEMIA (ICD-272.4) not assessed since 2009 will recheck at CPE  Problem # 3:  ATRIAL FIBRILLATION (ICD-427.31) rate controlled on Pradaxa per Dr. Graciela Husbands denies bleeding problems  His updated medication list for this problem includes:    Norvasc 5 Mg Tabs (Amlodipine besylate) ..... One tablet by mouth daily for blood pressure    Metoprolol Tartrate 25 Mg Tabs (Metoprolol tartrate) .Marland Kitchen... 1/2 by mouth two times a day  Problem # 4:  PROSTATE SPECIFIC ANTIGEN, ELEVATED (ICD-790.93) f/u with Dr. Annabell Howells at [facility]  Problem # 5:  ABNORMALITY OF GAIT (ICD-781.2) much improved after PT  Problem # 6:  SEIZURE DISORDER (ICD-780.39) no reported seizures in years with Phenobarbital level consistently below range  His updated medication list for this problem includes:    Phenobarbital 97.2 Mg Tabs (Phenobarbital) ..... One by mouth at bedtime  Problem # 7:  ANEMIA-NOS (ICD-285.9)  stable at last check  His updated medication list for this problem includes:    Vitamin B-12 500 Mcg Tabs (Cyanocobalamin) .Marland Kitchen... 2 tabs by mouth once daily    Iferex 150 150 Mg Caps (Polysaccharide iron complex) .Marland Kitchen... Take one capsule daily  Orders: T-CBC w/Diff ([id number])  Complete Medication List: 1)  Phenobarbital 97.2 Mg Tabs (Phenobarbital) .... One by mouth at bedtime 2)  Benazepril Hcl 20 Mg Tabs (Benazepril hcl) .... Take 1 tablet by mouth once a day 3)  Vitamin B-12 500 Mcg Tabs (Cyanocobalamin) .... 2 tabs by mouth once daily 4)  Peridex 0.12 % Soln (Chlorhexidine gluconate) .... Swish 15 cc by mouth two times a day for gum disease 5)  Norvasc 5 Mg Tabs (Amlodipine besylate) .... One tablet by mouth daily for blood pressure 6)  Metoprolol Tartrate 25 Mg Tabs (Metoprolol tartrate) .... 1/2 by mouth two times a day 7)  Iferex 150 150 Mg Caps (Polysaccharide iron complex) .... Take one capsule daily 8)  Pradaxa 150 Mg Caps (Dabigatran etexilate mesylate) .... Take one capsule two times  a day 9)  Finasteride 5 Mg Tabs (Finasteride) .... One tablet by mouth daily  Other Orders: Tdap => 17yrs IM [id number]) Admin 1st Vaccine (20254) Admin 1st Vaccine [facility]) [phone])  Patient Instructions: 1)  Td shot today. 2)  Please schedule a follow-up appointment in 4 months with Kimimila Tauzin for CPE.  Come fasting (nothing to eat or drink after midnight the night before except water). 3)      Tetanus/Td Vaccine    Vaccine Type: Tdap    Site: left deltoid    Mfr: Sanofi Pasteur    Dose: 0.5 ml    Route: IM  Given by: Armenia Shannon    Exp. Date: 11/12/2011    Lot #: [id number]    VIS given: 03/04/07 version given August 12, 2009.

## 2010-05-16 NOTE — Assessment & Plan Note (Signed)
Summary: A WEEK FU PER Donald Reynolds////KT   Vital Signs:  Patient profile:   77 year old male Height:      70 inches Weight:      206 pounds BMI:     29.66 Temp:     97.8 degrees F oral Pulse rate:   63 / minute Pulse rhythm:   regular Resp:     18 per minute BP sitting:   139 / 95  (left arm) Cuff size:   large  Vitals Entered By: Armenia Shannon (April 29, 2009 9:31 AM)  Serial Vital Signs/Assessments:  Time      Position  BP       Pulse  Resp  Temp     By 10:26 AM            112/82                         Tereso Newcomer PA-C  Comments: 10:26 AM left arm By: Tereso Newcomer PA-C   CC: f/u... Is Patient Diabetic? No Pain Assessment Patient in pain? no       Does patient need assistance? Functional Status Self care Ambulation Normal   Primary Care Provider:  Tereso Newcomer, PA-C  CC:  f/u....  History of Present Illness: 77 year old male with recent onset of atrial fibrillation returns for followup today.  He has not yet seen a cardiologist.  He is continuing to take an aspirin 325 mg a day.  He is taking metoprolol 12.5 mg b.i.d.  I reduced his enalapril at last visit to 20 mg a day.  He has not fallen since I last saw him.  He denies any palpitations.  He denies chest pain.  Denies shortness breath.  Denies extremity edema.  Denies any orthopnea, paroxysmal nocturnal dyspnea.  He denies any syncope.  A long discussion with the patient and his sister who accompanies him today.  I went over the risks and benefits of Coumadin therapy in the setting of atrial fibrillation.  His risk for stroke is quite high with a CHADS2 score of 4.  He understands the risk and would refer her to remain on aspirin only.  He does have somewhat of a fall risk.  I am not sure that he would completely understand dosing and followup of Coumadin.  In light of his fall risk and poor understanding he may not be a great candidate.  Problems Prior to Update: 1)  Atrial Fibrillation  (ICD-427.31) 2)   Prostate Specific Antigen, Elevated  (ICD-790.93) 3)  Hx of Salmonella Infection  (ICD-003.9) 4)  Frequency, Urinary  (ICD-788.41) 5)  Abnormality of Gait  (ICD-781.2) 6)  Caries, Dental, Unspecified  (ICD-521.00) 7)  Gingivitis, Chronic, Non-plaque Induced  (ICD-523.11) 8)  Hyperlipidemia  (ICD-272.4) 9)  Hypertension  (ICD-401.9) 10)  Gerd  (ICD-530.81) 11)  Anemia-nos  (ICD-285.9) 12)  Seizure Disorder  (ICD-780.39) 13)  Vitiligo  (ICD-709.01) 14)  Hypertension, Benign Essential  (ICD-401.1) 15)  Anemia, B12 Deficiency  (ICD-281.1)  Current Medications (verified): 1)  Phenobarbital 97.2 Mg Tabs (Phenobarbital) .... One By Mouth At Bedtime 2)  Benazepril Hcl 20 Mg Tabs (Benazepril Hcl) .... Take 1 Tablet By Mouth Once A Day 3)  Vitamin B-12 Cr 1000 Mcg Tbcr (Cyanocobalamin) .... Monthly 4)  Peridex 0.12 %  Soln (Chlorhexidine Gluconate) .... Swish 15 Cc By Mouth Two Times A Day For Gum Disease 5)  Nu-Iron 150 Mg Caps (Polysaccharide Iron Complex) .... One Capsule By  Mouth Daily 6)  Norvasc 5 Mg Tabs (Amlodipine Besylate) .... One Tablet By Mouth Daily For Blood Pressure 7)  Metoprolol Tartrate 25 Mg Tabs (Metoprolol Tartrate) .... 1/2 By Mouth Two Times A Day 8)  Bayer Aspirin 325 Mg Tabs (Aspirin) .... Take 1 Tablet By Mouth Once A Day  Allergies (verified): No Known Drug Allergies  Physical Exam  General:  alert, well-developed, and well-nourished.   Head:  normocephalic and atraumatic.   Lungs:  normal breath sounds, no crackles, and no wheezes.   Heart:  normal rate and regular rhythm.   Extremities:  no edema bilaterally Neurologic:  alert & oriented X3, cranial nerves II-XII intact, and abnormal gait.   Psych:  normally interactive.     Impression & Recommendations:  Problem # 1:  ATRIAL FIBRILLATION (ICD-427.31) rate controlled Continue metoprolol Blood pressure controlled with enalapril and Norvasc and metoprolol (repeat blood pressure optimal) Follow up with  cardiology as planned As noted above, I doubt he will be a great candidate for Coumadin.  He also is adamant that he remain on aspirin only.  His updated medication list for this problem includes:    Norvasc 5 Mg Tabs (Amlodipine besylate) ..... One tablet by mouth daily for blood pressure    Metoprolol Tartrate 25 Mg Tabs (Metoprolol tartrate) .Marland Kitchen... 1/2 by mouth two times a day    Bayer Aspirin 325 Mg Tabs (Aspirin) .Marland Kitchen... Take 1 tablet by mouth once a day  Problem # 2:  ABNORMALITY OF GAIT (ICD-781.2)  referred physical therapy for balance  Orders: Physical Therapy Referral (PT)  Complete Medication List: 1)  Phenobarbital 97.2 Mg Tabs (Phenobarbital) .... One by mouth at bedtime 2)  Benazepril Hcl 20 Mg Tabs (Benazepril hcl) .... Take 1 tablet by mouth once a day 3)  Vitamin B-12 Cr 1000 Mcg Tbcr (Cyanocobalamin) .... Monthly 4)  Peridex 0.12 % Soln (Chlorhexidine gluconate) .... Swish 15 cc by mouth two times a day for gum disease 5)  Nu-iron 150 Mg Caps (Polysaccharide iron complex) .... One capsule by mouth daily 6)  Norvasc 5 Mg Tabs (Amlodipine besylate) .... One tablet by mouth daily for blood pressure 7)  Metoprolol Tartrate 25 Mg Tabs (Metoprolol tartrate) .... 1/2 by mouth two times a day 8)  Bayer Aspirin 325 Mg Tabs (Aspirin) .... Take 1 tablet by mouth once a day  Patient Instructions: 1)  Follow up with Judiann Celia in 2 weeks as scheduled. A  EKG  Procedure date:  04/29/2009  Findings:      AFib HR 78 LAD LVH Inf q waves

## 2010-05-18 NOTE — Progress Notes (Signed)
Summary: Needs pneumovax?  Phone Note Call from Patient   Summary of Call: Pt. sister wants pt. to receive pneumovax -- per records, had 01/14/2008.  I don't believe he needs to receive another one -- is that correct? Initial call taken by: Dutch Quint RN,  March 28, 2010 9:35 AM  Follow-up for Phone Call        no, it is not time for him to receive another one let sister know he just received it in 2009.  This is NOT a yearly vaccine like the flu vaccine Follow-up by: Lehman Prom FNP,  March 28, 2010 10:00 AM  Additional Follow-up for Phone Call Additional follow up Details #1::        Spoke with pt. and informed of Kaity Pitstick's response.  Verbalized understanding.  Dutch Quint RN  March 28, 2010 11:16 AM

## 2010-05-18 NOTE — Assessment & Plan Note (Signed)
Summary: HTN   Vital Signs:  Patient profile:   77 year old male Weight:      197.6 pounds Temp:     97.8 degrees F oral Pulse rate:   76 / minute Pulse rhythm:   regular Resp:     20 per minute BP sitting:   108 / 70  (left arm) Cuff size:   regular  Vitals Entered By: Levon Hedger (April 20, 2010 9:06 AM) CC: went to [facility] for foot pain on the top of his right foot, Hypertension Management, Lipid Management, Abdominal Pain Is Patient Diabetic? No Pain Assessment Patient in pain? no       Does patient need assistance? Functional Status Self care Ambulation Normal   Primary Care Provider:  Tereso Newcomer, PA-C  CC:  went to [facility] for foot pain on the top of his right foot, Hypertension Management, Lipid Management, and Abdominal Pain.  History of Present Illness:  Pt into the office for routine f/u. pt presents today with all his medications. Sister transported pt to the office but she waiting in the waiting room.   Foot pain - Yesterday morning pt reports that he got out of bed and his right foot was hurting.   He went to the ER and was given ibuprofen and oxycodone as needed. He has taken the pain meds as ordered and today when he awoke right foot pain was better. Pt admits that he eats food before taking the pain meds  Dyspepsia History:      He has no alarm features of dyspepsia including no history of melena, hematochezia, dysphagia, persistent vomiting, or involuntary weight loss > 5%.  There is a prior history of GERD.  The patient does not have a prior history of documented ulcer disease.  The dominant symptom is not heartburn or acid reflux.    Hypertension History:      He denies headache, chest pain, and palpitations.  He notes no problems with any antihypertensive medication side effects.        Positive major cardiovascular risk factors include male age 27 years old or older, hyperlipidemia, and hypertension.  Negative major cardiovascular  risk factors include no history of diabetes and non-tobacco-user status.        Further assessment for target organ damage reveals no history of ASHD, stroke/TIA, or peripheral vascular disease.    Lipid Management History:      Positive NCEP/ATP III risk factors include male age 39 years old or older and hypertension.  Negative NCEP/ATP III risk factors include non-diabetic, non-tobacco-user status, no ASHD (atherosclerotic heart disease), no prior stroke/TIA, no peripheral vascular disease, and no history of aortic aneurysm.        The patient does not know about adjunctive measures for cholesterol lowering.  Adjunctive measures started by the patient include weight reduction.  He expresses no side effects from his lipid-lowering medication.  The patient denies any symptoms to suggest myopathy or liver disease.       Habits & Providers  Alcohol-Tobacco-Diet     Alcohol drinks/day: 0     Tobacco Status: quit  Exercise-Depression-Behavior     Does Patient Exercise: yes     Exercise Counseling: not indicated; exercise is adequate     Type of exercise: walking     Exercise (avg: min/session): 5-10     Times/week: 5     Depression Counseling: not indicated; screening negative for depression     Drug Use: no  Allergies (  verified): No Known Drug Allergies  Review of Systems CV:  Denies chest pain or discomfort and palpitations. Resp:  Denies cough. GI:  Denies abdominal pain, diarrhea, vomiting, and vomiting blood. MS:  Complains of joint pain; right foot pain - started on yesterday. pt went to the ER and was treated.  he has taken pain meds and pain has improved. Neuro:  Denies seizures; "It's been so long since I've had a seizure that I don't remember".  Physical Exam  General:  alert.   Mouth:  poor dentition.   Lungs:  normal breath sounds.   Heart:  normal rate and irregular rhythm.   Abdomen:  normal bowel sounds.   Msk:  right wrist splint in place Neurologic:  alert &  oriented X3.   Skin:  virtiligo Psych:  Oriented X3.     Impression & Recommendations:  Problem # 1:  HYPERTENSION, BENIGN ESSENTIAL (ICD-401.1) BP stable DASH diet continue current meds His updated medication list for this problem includes:    Benazepril Hcl 20 Mg Tabs (Benazepril hcl) .Marland Kitchen... Take 1 tablet by mouth once a day    Norvasc 5 Mg Tabs (Amlodipine besylate) ..... One tablet by mouth daily for blood pressure    Metoprolol Tartrate 25 Mg Tabs (Metoprolol tartrate) .Marland Kitchen... 1/2 by mouth two times a day  Problem # 2:  SEIZURE DISORDER (ICD-780.39) no recent seizures. continue current meds His updated medication list for this problem includes:    Phenobarbital 97.2 Mg Tabs (Phenobarbital) ..... One by mouth at bedtime  Problem # 3:  HYPERLIPIDEMIA (ICD-272.4) stable  continue current meds  Problem # 4:  GERD (ICD-530.81) stable at this time  Problem # 5:  ATRIAL FIBRILLATION (ICD-427.31) pt to maintain f/u with cardiology he is on pradaxa from cardiology His updated medication list for this problem includes:    Norvasc 5 Mg Tabs (Amlodipine besylate) ..... One tablet by mouth daily for blood pressure    Metoprolol Tartrate 25 Mg Tabs (Metoprolol tartrate) .Marland Kitchen... 1/2 by mouth two times a day  Problem # 6:  BENIGN PROSTATIC HYPERTROPHY, WITH URINARY OBSTRUCTION (ICD-600.01) pt to maintain f/u by Urology  Complete Medication List: 1)  Phenobarbital 97.2 Mg Tabs (Phenobarbital) .... One by mouth at bedtime 2)  Benazepril Hcl 20 Mg Tabs (Benazepril hcl) .... Take 1 tablet by mouth once a day 3)  Vitamin B-12 500 Mcg Tabs (Cyanocobalamin) .... 2 tabs by mouth once daily 4)  Norvasc 5 Mg Tabs (Amlodipine besylate) .... One tablet by mouth daily for blood pressure 5)  Metoprolol Tartrate 25 Mg Tabs (Metoprolol tartrate) .... 1/2 by mouth two times a day 6)  Iferex 150 150 Mg Caps (Polysaccharide iron complex) .... Take one capsule daily 7)  Pradaxa 150 Mg Caps (Dabigatran  etexilate mesylate) .... Take one capsule two times a day 8)  Finasteride 5 Mg Tabs (Finasteride) .... One tablet by mouth daily 9)  Ibuprofen 800 Mg Tabs (Ibuprofen) .... One tablet by mouth three times a day as needed for pain **rx from er** 10)  Oxycodone-acetaminophen 5-325 Mg Tabs (Oxycodone-acetaminophen) .... One tablet by mouth two times a day as needed for pain **rx by er**  Hypertension Assessment/Plan:      The patient's hypertensive risk group is category B: At least one risk factor (excluding diabetes) with no target organ damage.  His calculated 10 year risk of coronary heart disease is 9 %.  Today's blood pressure is 108/70.  His blood pressure goal is < 140/90.  Lipid Assessment/Plan:      Based on NCEP/ATP III, the patient's risk factor category is "2 or more risk factors and a calculated 10 year CAD risk of < 20%".  The patient's lipid goals are as follows: Total cholesterol goal is 200; LDL cholesterol goal is 100; HDL cholesterol goal is 40; Triglyceride goal is 150.  His LDL cholesterol goal has been met.    Patient Instructions: 1)  Schedule follow up in 3 months for blood pressure 2)  You will need vitamin B12 and tsh 3)  Blood pressure - doing well today.  Keep taking all your blood pressure medications 4)  Keep up with your appointments with Dr. Broadus John (urology) and Cardiology for regular follow up 5)  Foot pain - take medications as ordered by ER.  Be sure to eat food before taking the medication   Orders Added: 1)  Est. Patient Level IV [04540]

## 2010-05-18 NOTE — Progress Notes (Signed)
Summary: QUESTION ABOUT MEDS   Phone Note Call from Patient Call back at [phone]   Caller: Other Relative Summary of Call: PT WENT TO THE HOSITAL, THEY GAVE HIM OXYCODONE AND MORTON BUT THEY FEEL THAT THE OXYCODONE WILL AFFECT WITH THE OTHER MEDS HE IS TAKING WANTED TO KNOW IF HE SHOULD JUST TAKE THE MORTON ONLY FOR PAIN AND INFLAMATION,  Initial call taken by: Oscar La   Follow-up for Phone Call        E-chart note is on your desk for review  Follow-up by: Levon Hedger,  March 23, 2010 12:26 PM  Additional Follow-up for Phone Call Additional follow up Details #1::        actually its the motrin that has an interaction with his pradaxa which is the medication he is taking for his heart. he should take the percocet as needed for pain instead pt was instructed to f/u with Dr. Danae Orleans at [facility] neurology and to call to make an appt on the morning following his ER visit.  Did he or his sister do this? if not, the number is [phone] and pt should make appt Additional Follow-up by: Lehman Prom FNP,  March 27, 2010 6:18 PM    Additional Follow-up for Phone Call Additional follow up Details #2::    Pt. has appt. at [facility] on December 19th.  Advised as per provider's instructions for Percocet and explained interaction.  Sister verbalized understanding.  Dutch Quint RN  March 28, 2010 9:32 AM

## 2010-06-27 LAB — CBC
HCT: 36.8 % — ABNORMAL LOW (ref 39.0–52.0)
Hemoglobin: 12.8 g/dL — ABNORMAL LOW (ref 13.0–17.0)
MCV: 90.4 fL (ref 78.0–100.0)
RDW: 13.3 % (ref 11.5–15.5)
WBC: 8.2 10*3/uL (ref 4.0–10.5)

## 2010-06-27 LAB — DIFFERENTIAL
Basophils Absolute: 0 10*3/uL (ref 0.0–0.1)
Eosinophils Relative: 0 % (ref 0–5)
Lymphocytes Relative: 9 % — ABNORMAL LOW (ref 12–46)
Monocytes Absolute: 0.4 10*3/uL (ref 0.1–1.0)
Monocytes Relative: 4 % (ref 3–12)

## 2010-06-27 LAB — BASIC METABOLIC PANEL
GFR calc Af Amer: >60 mL/min (ref >60)
GFR calc non Af Amer: 55 mL/min — ABNORMAL LOW (ref >60)
Potassium: 4.2 mEq/L (ref 3.5–5.1)
Sodium: 140 mEq/L (ref 135–145)

## 2010-06-27 LAB — APTT: aPTT: 47 seconds — ABNORMAL HIGH (ref 24–37)

## 2010-07-17 LAB — DIFFERENTIAL
Lymphocytes Relative: 12 % (ref 12–46)
Lymphs Abs: 0.8 10*3/uL (ref 0.7–4.0)
Monocytes Absolute: 0.5 10*3/uL (ref 0.1–1.0)
Monocytes Relative: 7 % (ref 3–12)
Neutro Abs: 5.8 10*3/uL (ref 1.7–7.7)
Neutrophils Relative %: 81 % — ABNORMAL HIGH (ref 43–77)

## 2010-07-17 LAB — POCT I-STAT, CHEM 8
BUN: 20 mg/dL (ref 6–23)
Calcium, Ion: 1.16 mmol/L (ref 1.12–1.32)
Chloride: 105 mEq/L (ref 96–112)
Glucose, Bld: 132 mg/dL — ABNORMAL HIGH (ref 70–99)
Potassium: 3.9 mEq/L (ref 3.5–5.1)

## 2010-07-17 LAB — URINALYSIS, ROUTINE W REFLEX MICROSCOPIC
Bilirubin Urine: NEGATIVE
Nitrite: NEGATIVE
Specific Gravity, Urine: 1.009 (ref 1.005–1.030)
Urobilinogen, UA: 0.2 mg/dL (ref 0.0–1.0)
pH: 6.5 (ref 5.0–8.0)

## 2010-07-17 LAB — CBC
Hemoglobin: 11.4 g/dL — ABNORMAL LOW (ref 13.0–17.0)
RBC: 3.67 MIL/uL — ABNORMAL LOW (ref 4.22–5.81)
WBC: 7.2 10*3/uL (ref 4.0–10.5)

## 2010-07-24 LAB — BASIC METABOLIC PANEL
BUN: 43 mg/dL — ABNORMAL HIGH (ref 6–23)
BUN: 56 mg/dL — ABNORMAL HIGH (ref 6–23)
BUN: 61 mg/dL — ABNORMAL HIGH (ref 6–23)
BUN: 49 mg/dL — ABNORMAL HIGH (ref 6–23)
CO2: 15 mEq/L — ABNORMAL LOW (ref 19–32)
CO2: 16 mEq/L — ABNORMAL LOW (ref 19–32)
CO2: 20 mEq/L (ref 19–32)
CO2: 19 mEq/L (ref 19–32)
CO2: 20 mEq/L (ref 19–32)
Chloride: 110 mEq/L (ref 96–112)
Chloride: 114 mEq/L — ABNORMAL HIGH (ref 96–112)
Chloride: 107 mEq/L (ref 96–112)
Chloride: 109 mEq/L (ref 96–112)
Creatinine, Ser: 2.08 mg/dL — ABNORMAL HIGH (ref 0.4–1.5)
Creatinine, Ser: 2.9 mg/dL — ABNORMAL HIGH (ref 0.4–1.5)
GFR calc Af Amer: 42 mL/min — ABNORMAL LOW (ref >60)
GFR calc non Af Amer: 26 mL/min — ABNORMAL LOW (ref >60)
GFR calc non Af Amer: 25 mL/min — ABNORMAL LOW (ref >60)
Glucose, Bld: 103 mg/dL — ABNORMAL HIGH (ref 70–99)
Glucose, Bld: 126 mg/dL — ABNORMAL HIGH (ref 70–99)
Glucose, Bld: 118 mg/dL — ABNORMAL HIGH (ref 70–99)
Glucose, Bld: 135 mg/dL — ABNORMAL HIGH (ref 70–99)
Potassium: 2.9 mEq/L — ABNORMAL LOW (ref 3.5–5.1)
Potassium: 3.4 mEq/L — ABNORMAL LOW (ref 3.5–5.1)
Potassium: 3.6 mEq/L (ref 3.5–5.1)
Potassium: 3.4 mEq/L — ABNORMAL LOW (ref 3.5–5.1)
Potassium: 3.4 mEq/L — ABNORMAL LOW (ref 3.5–5.1)
Sodium: 139 mEq/L (ref 135–145)
Sodium: 134 mEq/L — ABNORMAL LOW (ref 135–145)
Sodium: 136 mEq/L (ref 135–145)

## 2010-07-24 LAB — DIFFERENTIAL
Basophils Absolute: 0 10*3/uL (ref 0.0–0.1)
Basophils Relative: 0 % (ref 0–1)
Eosinophils Absolute: 0 10*3/uL (ref 0.0–0.7)
Eosinophils Relative: 0 % (ref 0–5)
Eosinophils Relative: 0 % (ref 0–5)
Monocytes Absolute: 0.7 10*3/uL (ref 0.1–1.0)
Monocytes Relative: 8 % (ref 3–12)
Neutrophils Relative %: 83 % — ABNORMAL HIGH (ref 43–77)
WBC Morphology: INCREASED

## 2010-07-24 LAB — CBC
HCT: 41 % (ref 39.0–52.0)
HCT: 45.9 % (ref 39.0–52.0)
Hemoglobin: 15.4 g/dL (ref 13.0–17.0)
MCHC: 33.5 g/dL (ref 30.0–36.0)
MCHC: 33.6 g/dL (ref 30.0–36.0)
MCHC: 33.6 g/dL (ref 30.0–36.0)
MCV: 92.4 fL (ref 78.0–100.0)
MCV: 92.8 fL (ref 78.0–100.0)
MCV: 93.9 fL (ref 78.0–100.0)
Platelets: 142 10*3/uL — ABNORMAL LOW (ref 150–400)
Platelets: 150 10*3/uL (ref 150–400)
RBC: 4.42 MIL/uL (ref 4.22–5.81)
RBC: 4.25 MIL/uL (ref 4.22–5.81)
RDW: 14.4 % (ref 11.5–15.5)
RDW: 13.2 % (ref 11.5–15.5)
WBC: 5.7 10*3/uL (ref 4.0–10.5)

## 2010-07-24 LAB — BRAIN NATRIURETIC PEPTIDE: Pro B Natriuretic peptide (BNP): 59.6 pg/mL (ref 0.0–100.0)

## 2010-07-24 LAB — COMPREHENSIVE METABOLIC PANEL
AST: 21 U/L (ref 0–37)
Alkaline Phosphatase: 85 U/L (ref 39–117)
BUN: 27 mg/dL — ABNORMAL HIGH (ref 6–23)
CO2: 23 mEq/L (ref 19–32)
CO2: 23 mEq/L (ref 19–32)
Calcium: 8.7 mg/dL (ref 8.4–10.5)
Calcium: 9.3 mg/dL (ref 8.4–10.5)
Creatinine, Ser: 1.79 mg/dL — ABNORMAL HIGH (ref 0.4–1.5)
GFR calc Af Amer: 44 mL/min — ABNORMAL LOW (ref >60)
GFR calc non Af Amer: 37 mL/min — ABNORMAL LOW (ref >60)
GFR calc non Af Amer: 40 mL/min — ABNORMAL LOW (ref >60)
Glucose, Bld: 122 mg/dL — ABNORMAL HIGH (ref 70–99)
Glucose, Bld: 143 mg/dL — ABNORMAL HIGH (ref 70–99)
Potassium: 2.7 mEq/L — CL (ref 3.5–5.1)
Total Protein: 6.7 g/dL (ref 6.0–8.3)
Total Protein: 6.8 g/dL (ref 6.0–8.3)

## 2010-07-24 LAB — HEPATIC FUNCTION PANEL
AST: 23 U/L (ref 0–37)
Albumin: 3.6 g/dL (ref 3.5–5.2)

## 2010-07-24 LAB — CLOSTRIDIUM DIFFICILE EIA

## 2010-07-24 LAB — PHENOBARBITAL LEVEL: Phenobarbital: 13.3 ug/mL — ABNORMAL LOW (ref 15.0–40.0)

## 2010-07-24 LAB — TSH: TSH: 1.645 u[IU]/mL (ref 0.350–4.500)

## 2010-07-24 LAB — OVA AND PARASITE EXAMINATION: Ova and parasites: NONE SEEN

## 2010-07-24 LAB — POCT I-STAT, CHEM 8
BUN: 19 mg/dL (ref 6–23)
Creatinine, Ser: 1.4 mg/dL (ref 0.4–1.5)
Glucose, Bld: 120 mg/dL — ABNORMAL HIGH (ref 70–99)
Sodium: 137 mEq/L (ref 135–145)
TCO2: 22 mmol/L (ref 0–100)

## 2010-07-24 LAB — STOOL CULTURE

## 2010-07-24 LAB — MAGNESIUM: Magnesium: 2 mg/dL (ref 1.5–2.5)

## 2010-07-24 LAB — FECAL LACTOFERRIN, QUANT

## 2010-08-29 NOTE — Discharge Summary (Signed)
NAME:  CROSLEY, STEJSKAL NO.:  [id number]   MEDICAL RECORD NO.:  [id number]          PATIENT TYPE:  INP   LOCATION:  1529                         FACILITY:  [facility]   PHYSICIAN:  Richarda Overlie, MD       DATE OF BIRTH:  [birth year]   DATE OF ADMISSION:  09/26/2008  DATE OF DISCHARGE:                               DISCHARGE SUMMARY   Mr. Schiffman has done well overnight.  No nausea, vomiting, abdominal  pain.  The patient has remained afebrile overnight, seen by infectious  disease yesterday.  His Flagyl and Imodium were discontinued.   PHYSICAL EXAMINATION:  VITAL SIGNS: Temperature 97.9, pulse of 77,  respirations 18, blood pressure 117/60, 96% on room air.  GENERAL: The patient appears to be comfortable, alert, in no acute  distress.  HEENT:  Pupils equal and reactive.  Extraocular movements intact.  LUNGS: Clear to auscultation bilaterally.  No wheezes, no crackles or  rhonchi.  ABDOMEN: Soft, nontender, nondistended.  EXTREMITIES: Without cyanosis, clubbing or edema.  NEUROLOGIC: Cranial nerves II-XII grossly intact.   ASSESSMENT AND PLAN:  1. Diarrhea secondary to Salmonella infection.  Conservative      management at this point.  No antibiotics indicated per ID.  2. Nausea, vomiting has resolved.   DISPOSITION:  Discharge summary done.  Patient pending placement upon  decision by the sister to go to skilled nursing facility.      Richarda Overlie, MD  Electronically Signed     NA/MEDQ  D:  10/01/2008  T:  10/01/2008  Job:  [id number]

## 2010-08-29 NOTE — Discharge Summary (Signed)
NAME:  BASEL, DEFALCO NO.:  [id number]   MEDICAL RECORD NO.:  [id number]          PATIENT TYPE:  INP   LOCATION:  1529                         FACILITY:  [facility]   PHYSICIAN:  Richarda Overlie, MD       DATE OF BIRTH:  Jul 16, 1925   DATE OF ADMISSION:  09/26/2008  DATE OF DISCHARGE:  10/01/2008                               DISCHARGE SUMMARY   DISCHARGE DIAGNOSIS:  1. Diarrhea secondary to Salmonella species.  2. Acute on chronic renal insufficiency.  3. Hypokalemia.  4. Non Anion gap metabolic acidosis secondary to diarrhea.   SECONDARY DIAGNOSIS:  History of seizure disorder.   CONSULTATIONS:  Infectious disease.   SUBJECTIVE:  This is an 78 year old male with a history of seizure  disorder who presents to the ER with diarrheal illness with five bowel  movements a day associated with nausea and vomiting.  The patient was  found to have a low grade temperature of 99.1 in the ER.  His initial  potassium was 3.2, bicarb of 23, creatinine was 1.79.  Liver function  tests were within normal limits.  The patient was evaluated for his  diarrhea with C. diff toxin A and B, ova and parasites all of which were  negative.  His initial WBC count was 3.7.  The patient's C. diff toxin A  and B was negative.  Fecal lactoferrin was positive.  Ova and parasites  were negative.  The patient's stool culture was positive for Salmonella.  The patient had empirically been started on Cipro and Flagyl which were  discontinued.  His loperamide was also discontinued as these medications  were shown to prolong symptoms and slow GI clearance of the infection.  If the patient has persistent fever, bacteremia or focal infection,  antibiotic treatment may be indicated.  Antibiotic treatment does not  alter the natural history of this illness.  Therefore, the patient is  not being discharged on any antibiotics at this time.   DISCHARGE MEDICATIONS:  Zofran 4 mg p.o. q.6 h. p.r.n. nausea, K-Dur  20  mEq p.o. daily, phenobarb 100 mg p.o. q.h.s., benazepril 10 mg daily,  hold for systolic blood pressure less than 100, vitamin B12 1000 mcg  p.o. daily.      Richarda Overlie, MD  Electronically Signed     NA/MEDQ  D:  10/01/2008  T:  10/01/2008  Job:  [id number]

## 2010-08-29 NOTE — H&P (Signed)
NAME:  Donald Reynolds, Donald Reynolds NO.:  [id number]   MEDICAL RECORD NO.:  [id number]          PATIENT TYPE:  EMS   LOCATION:  ED                           FACILITY:  [facility]   PHYSICIAN:  Massie Maroon, MD        DATE OF BIRTH:  [birth year]   DATE OF ADMISSION:  09/26/2008  DATE OF DISCHARGE:                              HISTORY & PHYSICAL   CC:  Diarrhea, 2-1/2 days.   HPI:  An 77 year old male with a history of seizure disorder complains  of loose stool for 2-1/2 days.  He is having about 5 bowel movements a  day.  Earlier today he had an episode of nausea and vomiting which  consists of food.  Patient denies any abdominal pain, any blood in his  emesis, any bright red blood per rectum, or black stool.  He has not had  any recent antibiotics over the last 3 months and he denies being on any  new medication.  Patient has not traveled recently.  He has no sick  contacts.   Patient will be admitted for workup of diarrhea, as well as acute renal  failure.  He was found to have a creatinine of 1.79.  His baseline  yesterday was within normal limits.  He was also found to be slightly  hypokalemic with a potassium of 3.2 today.   PAST MEDICAL HISTORY:  1. Vitiligo.  2. Seizure disorder.  3. Hypertension.  4. BPH.   PAST SURGICAL HISTORY:  Hernia repair.   SOCIAL HISTORY:  Patient does not smoke or drink.   FAMILY HISTORY:  Mother had a stroke.  Father has a heart attack.   HEALTH MAINTENANCE:  Colonoscopy in 2010 negative per patient, done by  [facility].   ALLERGIES:  NO KNOWN DRUG ALLERGIES.   MEDICATIONS:  1. Phenobarb 97.2 mg q.h.s.  2. Benazepril 10 mg p.o. daily.  3. Vitamin B12.   REVIEW OF SYSTEMS:  Positive for some slight cough over the last couple  of days.   PHYSICAL EXAM:  Temperature 99.1.  Pulse 78.  Blood pressure 125/70.  Respiratory rate 20.  Pulse ox 94% on room air.  HEENT:  Anicteric, EOMI, no nystagmus, pupils 1.5 mm, symmetric, direct,  consensual, near reflex intact.  Mucous membranes moist.  NECK:  No JVD, no bruit, no thyromegaly, no adenopathy.  HEART:  Regular rate and rhythm.  S1-S2 with a 2/6 systolic ejection  murmur at the apex.  LUNGS:  Clear to auscultation bilaterally.  ABDOMEN:  Soft, nontender, nondistended.  Positive bowel sounds.  EXTREMITIES:  No cyanosis, clubbing, or edema, DP pulses 2+ bilaterally.  SKIN:  No rashes, positive vitiligo.  LYMPH NODES:  No adenopathy.  NEURO:  Nonfocal, cranial nerves II-XII intact, reflexes 2+, symmetric,  diffuse with downgoing toes bilaterally, motor strength 5/5 in all 4  extremities, pinprick intact.   LABS:  WBC 3.7, hemoglobin 13.4, platelet count 101.   Sodium 137, potassium 3.2 (low), chloride 106, bicarb 23, BUN 25,  creatinine 1.79 (high).  Glucose 143, AST 21, ALT 13.  BUN and  creatinine  yesterday were 19 and 1.4.   ASSESSMENT:  1. Diarrhea:  We will check stool for fecal leukocytes, stool culture,      stool for C. diff, and stool for ova and parasites.  Patient will      be treated with Flagyl 500 mg IV t.i.d.  He will also use Imodium.  2. Nausea, vomiting, ?secondary to viral gastroenteritis or phenobarb      toxicity.  We will check a phenobarb level.  3. Cough:  We will check a chest x-ray, PA and lateral, to rule out      any pneumonia as the cause of diarrhea.  4. Acute renal failure.  We will check urine sodium, urine      eosinophils, urine creatinine, renal ultrasound.  He is likely      prerenal due to all of his diarrhea, as well as nausea and      vomiting.  5. Seizure disorder:  We will continue with phenobarb at the same      prior dose as long as he is not phenobarb toxic.  6. Deep venous thrombosis prophylaxis:  We will use Lovenox 40 mg      subcu daily.      Massie Maroon, MD  Electronically Signed     JYK/MEDQ  D:  09/26/2008  T:  09/26/2008  Job:  [id number]   cc:   Fanny Dance. Rankins, M.D.  Fax: [phone]

## 2010-12-11 ENCOUNTER — Other Ambulatory Visit: Payer: Self-pay | Admitting: Internal Medicine

## 2011-05-30 ENCOUNTER — Emergency Department (HOSPITAL_COMMUNITY): Payer: Medicare Other

## 2011-05-30 ENCOUNTER — Encounter (HOSPITAL_COMMUNITY): Payer: Self-pay | Admitting: *Deleted

## 2011-05-30 ENCOUNTER — Emergency Department (HOSPITAL_COMMUNITY)
Admission: EM | Admit: 2011-05-30 | Discharge: 2011-05-30 | Disposition: A | Discharge status: Home / Self Care | Payer: Medicare Other | Attending: Emergency Medicine | Admitting: Emergency Medicine

## 2011-05-30 ENCOUNTER — Other Ambulatory Visit: Payer: Self-pay

## 2011-05-30 DIAGNOSIS — G5630 Lesion of radial nerve, unspecified upper limb: Secondary | ICD-10-CM | POA: Insufficient documentation

## 2011-05-30 DIAGNOSIS — M6281 Muscle weakness (generalized): Secondary | ICD-10-CM | POA: Insufficient documentation

## 2011-05-30 DIAGNOSIS — Z79899 Other long term (current) drug therapy: Secondary | ICD-10-CM | POA: Insufficient documentation

## 2011-05-30 DIAGNOSIS — M79609 Pain in unspecified limb: Secondary | ICD-10-CM | POA: Insufficient documentation

## 2011-05-30 DIAGNOSIS — I10 Essential (primary) hypertension: Secondary | ICD-10-CM | POA: Insufficient documentation

## 2011-05-30 DIAGNOSIS — M25539 Pain in unspecified wrist: Secondary | ICD-10-CM | POA: Insufficient documentation

## 2011-05-30 DIAGNOSIS — I2510 Atherosclerotic heart disease of native coronary artery without angina pectoris: Secondary | ICD-10-CM | POA: Insufficient documentation

## 2011-05-30 DIAGNOSIS — G319 Degenerative disease of nervous system, unspecified: Secondary | ICD-10-CM | POA: Insufficient documentation

## 2011-05-30 HISTORY — DX: Essential (primary) hypertension: I10

## 2011-05-30 HISTORY — DX: Atherosclerotic heart disease of native coronary artery without angina pectoris: I25.10

## 2011-05-30 LAB — CBC
HCT: 33.8 % — ABNORMAL LOW (ref 39.0–52.0)
Hemoglobin: 11.5 g/dL — ABNORMAL LOW (ref 13.0–17.0)
MCH: 30.6 pg (ref 26.0–34.0)
MCHC: 34 g/dL (ref 30.0–36.0)
RBC: 3.76 MIL/uL — ABNORMAL LOW (ref 4.22–5.81)

## 2011-05-30 LAB — DIFFERENTIAL
Basophils Relative: 1 % (ref 0–1)
Eosinophils Absolute: 0 10*3/uL (ref 0.0–0.7)
Lymphs Abs: 0.8 10*3/uL (ref 0.7–4.0)
Monocytes Absolute: 0.4 10*3/uL (ref 0.1–1.0)
Monocytes Relative: 7 % (ref 3–12)
Neutrophils Relative %: 79 % — ABNORMAL HIGH (ref 43–77)

## 2011-05-30 LAB — BASIC METABOLIC PANEL
BUN: 16 mg/dL (ref 6–23)
Chloride: 106 mEq/L (ref 96–112)
Creatinine, Ser: 1.23 mg/dL (ref 0.50–1.35)
GFR calc non Af Amer: 52 mL/min — ABNORMAL LOW (ref >90)
Glucose, Bld: 100 mg/dL — ABNORMAL HIGH (ref 70–99)
Potassium: 3.9 mEq/L (ref 3.5–5.1)

## 2011-05-30 MED ORDER — IBUPROFEN 800 MG PO TABS: 800.0000 mg | ORAL_TABLET | Freq: Three times a day (TID) | ORAL | Status: AC

## 2011-05-30 MED ORDER — HYDROCODONE-ACETAMINOPHEN 5-325 MG PO TABS: 1.0000 | ORAL_TABLET | Freq: Once | ORAL | Status: DC

## 2011-05-30 MED ORDER — IBUPROFEN 800 MG PO TABS
800.0000 mg | ORAL_TABLET | Freq: Once | ORAL | Status: AC
  Administered 2011-05-30: 800 mg via ORAL
  Filled 2011-05-30: qty 1

## 2011-05-30 NOTE — ED Notes (Signed)
Patient present to ED with complain of left hand pain that started today.  Patient denies any injury to the left hand.  Patient is above the wrist area.

## 2011-05-30 NOTE — ED Notes (Signed)
Patient transported to CT 

## 2011-05-30 NOTE — ED Provider Notes (Signed)
History     CSN: [id number]  Arrival date & time 05/30/11  0211   First MD Initiated Contact with Patient 05/30/11 [phone]      Chief Complaint  Patient presents with  . Hand Pain    (Consider location/radiation/quality/duration/timing/severity/associated sxs/prior treatment) HPI Comments: Patient presents with pain in his left dorsal forearm that woke her from sleep this morning. He denies any injury. The pain is in his left wrist and proximal forearm. There is no redness, swelling, numbness or tingling. Patient has had this pain before in his right arm and is treated to he says a muscle sprain. There is no pain in his neck, upper arm, chest, head. Patient believes that he may have slept on his arm funny.  The history is provided by the patient.    Past Medical History  Diagnosis Date  . Hypertension   . Coronary artery disease     History reviewed. No pertinent past surgical history.  History reviewed. No pertinent family history.  History  Substance Use Topics  . Smoking status: Never Smoker   . Smokeless tobacco: Not on file  . Alcohol Use: No      Review of Systems  Constitutional: Negative for activity change and appetite change.  HENT: Negative for neck pain and neck stiffness.   Respiratory: Negative for cough, chest tightness and shortness of breath.   Cardiovascular: Negative for chest pain.  Gastrointestinal: Negative for nausea, vomiting and abdominal pain.  Genitourinary: Negative for dysuria and hematuria.  Musculoskeletal: Positive for myalgias and arthralgias. Negative for back pain.  Skin: Negative for rash.  Neurological: Negative for headaches.    Allergies  Review of patient's allergies indicates no known allergies.  Home Medications   Current Outpatient Rx  Name Route Sig Dispense Refill  . AMLODIPINE BESYLATE 5 MG PO TABS Oral Take 5 mg by mouth daily.    Marland Kitchen BENAZEPRIL HCL 20 MG PO TABS Oral Take 20 mg by mouth daily.    Marland Kitchen FINASTERIDE 5 MG  PO TABS Oral Take 5 mg by mouth daily.    . IBUPROFEN 800 MG PO TABS Oral Take 800 mg by mouth every 8 (eight) hours as needed. Pain    . METOPROLOL TARTRATE 25 MG PO TABS Oral Take 12.5 mg by mouth 2 (two) times daily.    Marland Kitchen PHENOBARBITAL 97.2 MG PO TABS Oral Take 97.2 mg by mouth 2 (two) times daily.    Marland Kitchen POLYSACCHARIDE IRON 150 MG PO CAPS Oral Take 150 mg by mouth daily.    Marland Kitchen PRADAXA 150 MG PO CAPS  take 1 capsule by mouth twice a day 60 capsule 2  . VITAMIN B-12 500 MCG PO TABS Oral Take 1,000 mcg by mouth daily.    . IBUPROFEN 800 MG PO TABS Oral Take 1 tablet (800 mg total) by mouth 3 (three) times daily. 21 tablet 0    BP 138/92  Pulse 79  Temp(Src) 98.7 F (37.1 C) (Oral)  Resp 15  SpO2 95%  Physical Exam  Constitutional: He is oriented to person, place, and time. He appears well-developed and well-nourished. No distress.  HENT:  Head: Normocephalic and atraumatic.  Mouth/Throat: Oropharynx is clear and moist. No oropharyngeal exudate.  Eyes: Conjunctivae are normal. Pupils are equal, round, and reactive to light.  Neck: Normal range of motion.  Cardiovascular: Normal rate, regular rhythm and normal heart sounds.   Pulmonary/Chest: Effort normal and breath sounds normal. No respiratory distress.  Abdominal: Soft. There is no tenderness.  There is no rebound and no guarding.  Musculoskeletal: Normal range of motion. He exhibits no edema and no tenderness.  Neurological: He is alert and oriented to person, place, and time. He exhibits abnormal muscle tone.       Left grip strength is mildly weaker than the right. Patient has difficulty with wrist and finger extension. She +2 radial pulse, cardinal hand movements are intact. Tingling and numbness in a stocking glove fashion of the dorsal left lower arm No facial asymmetry, no ataxia on finger to nose  Skin: Skin is warm.    ED Course  Procedures (including critical care time)  Labs Reviewed  CBC - Abnormal; Notable for the  following:    RBC 3.76 (*)    Hemoglobin 11.5 (*)    HCT 33.8 (*)    Platelets 129 (*)    All other components within normal limits  DIFFERENTIAL - Abnormal; Notable for the following:    Neutrophils Relative 79 (*)    All other components within normal limits  BASIC METABOLIC PANEL - Abnormal; Notable for the following:    Glucose, Bld 100 (*)    GFR calc non Af Amer 52 (*)    GFR calc Af Amer 61 (*)    All other components within normal limits  PROTIME-INR - Abnormal; Notable for the following:    Prothrombin Time 15.4 (*)    All other components within normal limits   Dg Wrist Complete Left  05/30/2011  *RADIOLOGY REPORT*  Clinical Data: Left wrist weakness and pain.  LEFT WRIST - COMPLETE 3+ VIEW  Comparison: None.  Findings: Bone demineralization.  Degenerative changes in the radial carpal and STT joints.  Calcification in the triangular fibrocartilage region.  Soft tissue swelling.  No evidence of acute fracture or subluxation.  No focal bone lesion or bone destruction.  IMPRESSION: Diffuse bone demineralization with degenerative changes.  Soft tissue swelling.  Calcifications consistent with chondrocalcinosis.  Original Report Authenticated By: Marlon Pel, M.D.   Ct Head Wo Contrast  05/30/2011  *RADIOLOGY REPORT*  Clinical Data: Left hand pain and weakness.  History of seizure disorder and hypertension.  CT HEAD WITHOUT CONTRAST  Technique:  Contiguous axial images were obtained from the base of the skull through the vertex without contrast.  Comparison: 03/23/2010  Findings: Diffuse cerebral atrophy.  Low attenuation changes in the deep white matter consistent with small vessel ischemia.  Minimal ventricular dilatation consistent with central atrophy.  No mass effect or midline shift.  No abnormal extra-axial fluid collections.  Gray-white matter junctions are distinct.  Basal cisterns are not effaced.  No evidence of acute intracranial hemorrhage.  Vascular calcifications.   Mucosal membrane thickening in the maxillary antra.  No depressed skull fractures.  Stable appearance since previous study.  IMPRESSION: Chronic atrophy and small vessel ischemic changes.  No evidence of acute intracranial hemorrhage, mass lesion, or acute infarct. Mucosal membrane thickening in the maxillary antra.  Original Report Authenticated By: Marlon Pel, M.D.     1. Radial nerve palsy       MDM  Left hand pain with weakness suggestive of isolated or peripheral radial nerve palsy.  Patient's pain has improved after ibuprofen. His grip strength weakness also improved. He says his sensation is back to normal. We'll treat his radial nerve palsy with splint and sling.  I discussed with Dr. Thad Ranger of neurology. She agrees a typical radial nerve palsy findings, there is no reason to obtain MRI of his brain.  Patient doesn't have any other neurological deficits to suggest a central process. He will follow up with Dr. Danae Orleans in clinic.    Date: 05/30/2011  Rate: 74  Rhythm: atrial fibrillation  QRS Axis: left  Intervals: normal  ST/T Wave abnormalities: nonspecific ST/T changes  Conduction Disutrbances:none  Narrative Interpretation:   Old EKG Reviewed: unchanged    Glynn Octave, MD 05/30/11 1521

## 2011-05-30 NOTE — Discharge Instructions (Signed)
Your symptoms are coming from a compressed nerve in her forearm call the radial nerve.  There are no signs of a stroke. Followup with Dr. Danae Orleans this week. Return to ED if you develop new or worsening symptoms, trouble speaking or walking, weakness, numbness, tingling.  Radial Nerve Palsy Wrist drop is also known as radial nerve palsy. It is a condition in which you can not extend your wrist. This means if you are standing with your elbow bent at a right angle and with the top of your hand pointed at the ceiling, you can not hold your hand up. It falls toward the floor.  This action of extending your wrist is caused by the muscles in the back of your arm. These muscles are controlled by the radial nerve. This means that anything affecting the radial nerve so it can not tell the muscles how to work will cause wrist drop. This is medically called radial nerve palsy. Also the radial nerve is a motor and sensory nerve so anything affecting it causes problems with movement and feeling. CAUSES  Some more common causes of wrist drop are:  A break (fracture) of the large bone in the arm between your shoulder and your elbow (humerus). This is because the radial nerve winds around the humerus.   Improper use of crutches causes this because the radial nerve runs through the armpit (axilla). Crutches which are too long can put pressure on the nerve. This is sometimes called crutch palsy.   Falling asleep with your arm over a chair and supported on the back is a common cause. This is sometimes called Saturday Night Syndrome.   Wrist drop can be associated with lead poisoning because of the effect of lead on the radial nerve.  SYMPTOMS  The wrist drop is an obvious problem, but there may also be numbness in the back of the arm, forearm or hand which provides feeling in these areas by the radial nerve. There can be difficulty straightening out the elbow in addition to the wrist. There may be numbness, tingling,  pain, burning sensations or other abnormal feelings. Symptoms depend entirely on where the radial nerve is injured. DIAGNOSIS   Wrist drop is obvious just by looking at it. Your caregiver may make the diagnosis by taking your history and doing a couple tests.   One test which may be done is a nerve conduction study. This test shows if the radial nerve is conducting signals well. If not, it can determine where the nerve problem is.   Sometimes X-ray studies are done. Your caregiver will determine if further testing needs to be done.  TREATMENT   Usually if the problem is found to be pressure on the nerve, simply removing the pressure will allow the nerve to go back to normal in a few weeks to a few months. Other treatments will depend upon the cause found.   Only take over-the-counter or prescription medicines for pain, discomfort, or fever as directed by your caregiver.   Sometimes seizure medications are used.   Steroids are sometimes given to decrease swelling if it is thought to be a possible cause.  Document Released: 12/07/2005 Document Revised: 12/13/2010 Document Reviewed: 01/17/2006 [facility] Patient Information 2012 [place], [place].

## 2011-06-08 ENCOUNTER — Encounter: Payer: Self-pay | Admitting: Internal Medicine

## 2011-06-08 ENCOUNTER — Ambulatory Visit (INDEPENDENT_AMBULATORY_CARE_PROVIDER_SITE_OTHER): Payer: Medicare Other | Admitting: Internal Medicine

## 2011-06-08 VITALS — BP 119/80 | HR 68 | Ht 72.0 in | Wt 200.1 lb

## 2011-06-08 DIAGNOSIS — I10 Essential (primary) hypertension: Secondary | ICD-10-CM

## 2011-06-08 DIAGNOSIS — I4891 Unspecified atrial fibrillation: Secondary | ICD-10-CM

## 2011-06-08 NOTE — Assessment & Plan Note (Signed)
Atrial fibrillation is permanent. He is tolerating Pradaxa. His renal function is okay and a dose appropriate

## 2011-06-08 NOTE — Progress Notes (Signed)
  HPI  Donald Reynolds is a 78 y.o. male Seen in followup for atrial fibrillation and hypertension. He has a CHADS-VASc score of 4 and was started last year on Pradaxa  The patient denies chest pain, shortness of breath, nocturnal dyspnea, orthopnea or peripheral edema.  There have been no palpitations, lightheadedness or syncope.   The family has decreased his blood pressure medication as he seems to need it  seen at the hospital last week on Valentine's Day his creatinine at that time was normal  Past Medical History  Diagnosis Date  . Hypertension   . Coronary artery disease   . Diarrhea     secondary to Salmonella species  . Hypokalemia   . History of seizure disorder   . Nausea & vomiting   . Vitiligo   . Acute renal failure   . Deep venous thrombosis     Deep venous thrombosis prophylaxis    Past Surgical History  Procedure Date  . Hernia repair     Current Outpatient Prescriptions  Medication Sig Dispense Refill  . amLODipine (NORVASC) 5 MG tablet Take 5 mg by mouth daily.      . benazepril (LOTENSIN) 20 MG tablet Take 20 mg by mouth daily.      . finasteride (PROSCAR) 5 MG tablet Take 5 mg by mouth daily.      Marland Kitchen ibuprofen (ADVIL,MOTRIN) 800 MG tablet Take 1 tablet (800 mg total) by mouth 3 (three) times daily.  21 tablet  0  . metoprolol tartrate (LOPRESSOR) 25 MG tablet Take 12.5 mg by mouth 2 (two) times daily.      Marland Kitchen PHENobarbital (LUMINAL) 97.2 MG tablet Take 97.2 mg by mouth at bedtime.       Marland Kitchen PRADAXA 150 MG CAPS take 1 capsule by mouth twice a day  60 capsule  2  . vitamin B-12 (CYANOCOBALAMIN) 500 MCG tablet Take 1,000 mcg by mouth daily.        No Known Allergies  Review of Systems negative except from HPI and PMH  Physical Exam BP 119/80  Pulse 68  Ht 6' (1.829 m)  Wt 200 lb 1.9 oz (90.774 kg)  BMI 27.14 kg/m2 Well developed and well nourished in no acute distress HENT normal E scleral and icterus clear Neck Supple JVP flat Clear to  ausculation Regular rate and rhythm, no murmurs gallops or rub Soft with active bowel sounds No clubbing cyanosis none Edema Alert and oriented, grossly normal motor and sensory function Skin Warm and Dry  Electrocardiogram demonstrates atrial fibrillation at 68 Intervals-/0.09/0.411 Assessment and  Plan  Axis leftward at -54

## 2011-06-08 NOTE — Assessment & Plan Note (Signed)
There has been some symptomatic hypotension and down titration of some of his medications. His blood pressure today is 119; we will discontinue his amlodipine this may also help his edema

## 2011-06-08 NOTE — Patient Instructions (Signed)
Your physician has recommended you make the following change in your medication:  1) Stop amlodipine (norvasc).  Your physician wants you to follow-up in: 1 year with Dr. Graciela Husbands. You will receive a reminder letter in the mail two months in advance. If you don't receive a letter, please call our office to schedule the follow-up appointment.

## 2011-07-16 ENCOUNTER — Other Ambulatory Visit: Payer: Self-pay | Admitting: Internal Medicine

## 2012-04-07 ENCOUNTER — Encounter: Payer: Self-pay | Admitting: Gastroenterology

## 2012-04-23 ENCOUNTER — Encounter: Payer: Self-pay | Admitting: *Deleted

## 2012-04-29 ENCOUNTER — Ambulatory Visit (INDEPENDENT_AMBULATORY_CARE_PROVIDER_SITE_OTHER): Payer: Medicare Other | Admitting: Gastroenterology

## 2012-04-29 ENCOUNTER — Ambulatory Visit: Payer: Medicare Other | Admitting: Internal Medicine

## 2012-04-29 ENCOUNTER — Encounter: Payer: Self-pay | Admitting: Gastroenterology

## 2012-04-29 VITALS — BP 154/90 | HR 72 | Ht 72.0 in | Wt 199.8 lb

## 2012-04-29 DIAGNOSIS — R197 Diarrhea, unspecified: Secondary | ICD-10-CM

## 2012-04-29 DIAGNOSIS — F0390 Unspecified dementia without behavioral disturbance: Secondary | ICD-10-CM

## 2012-04-29 DIAGNOSIS — Z7901 Long term (current) use of anticoagulants: Secondary | ICD-10-CM

## 2012-04-29 DIAGNOSIS — L80 Vitiligo: Secondary | ICD-10-CM

## 2012-04-29 NOTE — Progress Notes (Signed)
History of Present Illness:  This is a partially demented 79 year old African American male with severe vitiligo of the skin.  He's had chronic hypertension, coronary artery disease, idiopathic seizure disorder, is followed by cardiology and is on daily Protonix 150 mg.  He is [facility] by the [facility] reevaluation of ?? occult positive stools.  The patient had a negative colonoscopy and endoscopy in 2008.  He denies abdominal pain, rectal pain, rectal bleeding.  Of concern to him today is apparently going to the bathroom twice a day instead of once.  Recent CBC a metabolic profile were normal.  He cooks his own food, and denies a specific food intolerances.  He has not had foreign travel or known infectious disease exposure.  The patient relates he has not had rectal exam and Dr. Ginette Otto office.  He apparently is on B12 replacement therapy in addition to as per Dr., also multiple antihypertensive medications.   I have reviewed this patient's present history, medical and surgical past history, allergies and medications.     ROS: The remainder of the 10 point ROS is negative     Physical Exam: Blood pressure 154/90, pulse 70 and regular, and blood and weight 199 with a BMI 27.1. General well developed well nourished patient in no acute distress, appearing their stated age, diffuse hypopigmentation of the skin on his trunk and extremities. Eyes PERRLA, no icterus, fundoscopic exam per opthamologist Skin no lesions noted Neck supple, no adenopathy, no thyroid enlargement, no tenderness Abdomen no hepatosplenomegaly masses or tenderness, BS normal.  Rectal inspection normal no fissures, or fistulae noted.  No masses or tenderness on digital exam. Stool guaiac negative. Extremities no acute joint lesions, edema, phlebitis or evidence of cellulitis...+1 edema bilaterally noted. Neurologic patient oriented x 3, cranial nerves intact, no focal neurologic deficits noted. Psychological mental  status normal and normal affect.  Assessment and plan: This patient stool appears normal and is guaiac negative.  He has no GI complaints except for 2 soft stools a day.  I do not think repeat colonoscopy or other GI evaluations needed at this per his age,other medical problems , anticoagulation, and negative endoscopic exams 5 years ago.  We have ordered stool exam for C. difficile toxin.  I placed him on daily Metamucil and liberal by mouth fluids.  His continue his other medications as listed and reviewed.  He has a somewhat poor mental status and has obvious mild dementia.  Her to continue other cardiac and blood pressure medications as listed and reviewed.  Encounter Diagnosis  Name Primary?  . Diarrhea Yes

## 2012-04-29 NOTE — Patient Instructions (Addendum)
Use Metamucil Daily Go to the basement today for a lab kit

## 2012-04-30 ENCOUNTER — Other Ambulatory Visit: Payer: Medicare Other

## 2012-04-30 DIAGNOSIS — R197 Diarrhea, unspecified: Secondary | ICD-10-CM

## 2012-05-07 ENCOUNTER — Telehealth: Payer: Self-pay | Admitting: Gastroenterology

## 2012-05-07 NOTE — Telephone Encounter (Signed)
lmom for wife to call back. CDIFF is negative.

## 2012-05-07 NOTE — Telephone Encounter (Signed)
Informed wife that CDIFF was negative. Wife stated understanding and states pt is better.

## 2012-06-24 ENCOUNTER — Ambulatory Visit (INDEPENDENT_AMBULATORY_CARE_PROVIDER_SITE_OTHER): Payer: Medicaid Other | Admitting: Internal Medicine

## 2012-06-24 ENCOUNTER — Encounter: Payer: Self-pay | Admitting: Internal Medicine

## 2012-06-24 VITALS — BP 129/81 | HR 76 | Ht 72.0 in | Wt 194.4 lb

## 2012-06-24 DIAGNOSIS — I4891 Unspecified atrial fibrillation: Secondary | ICD-10-CM

## 2012-06-24 NOTE — Patient Instructions (Signed)
Your physician wants you to follow-up in: 1 year. You will receive a reminder letter in the mail two months in advance. If you don't receive a letter, please call our office to schedule the follow-up appointment.  Continue current medications.

## 2012-06-24 NOTE — Progress Notes (Signed)
  HPI  Donald Reynolds is a 79 y.o. male Seen in followup for atrial fibrillation and hypertension. He has a CHADS-VASc score of 4 and was started last year on Pradaxa  The patient denies chest pain, shortness of breath, nocturnal dyspnea, orthopnea or peripheral edema.  There have been no palpitations, lightheadedness or syncope.    And blood work September 13 was normal for the renal function  Past Medical History  Diagnosis Date  . Hypertension   . Coronary artery disease   . Diarrhea     secondary to Salmonella species  . Hypokalemia   . History of seizure disorder   . Nausea & vomiting   . Vitiligo   . Acute renal failure   . Deep venous thrombosis     Deep venous thrombosis prophylaxis  . Hiatal hernia 2003  . Atrial fibrillation     Past Surgical History  Procedure Laterality Date  . Hernia repair      Current Outpatient Prescriptions  Medication Sig Dispense Refill  . benazepril (LOTENSIN) 20 MG tablet Take 20 mg by mouth daily.      . metoprolol tartrate (LOPRESSOR) 25 MG tablet Take 12.5 mg by mouth 2 (two) times daily.      Marland Kitchen PHENobarbital (LUMINAL) 97.2 MG tablet Take 97.2 mg by mouth at bedtime.       Marland Kitchen PRADAXA 150 MG CAPS take 1 capsule by mouth twice a day  60 capsule  2  . Pseudoephedrine-DM-GG (ROBITUSSIN COLD & COUGH PO) Take by mouth as needed.      . vitamin B-12 (CYANOCOBALAMIN) 500 MCG tablet Take 1,000 mcg by mouth daily.       No current facility-administered medications for this visit.    No Known Allergies  Review of Systems negative except from HPI and PMH  Physical Exam BP 129/81  Pulse 76  Ht 6' (1.829 m)  Wt 194 lb 6.4 oz (88.179 kg)  BMI 26.36 kg/m2  SpO2 98% Well developed and well nourished in no acute distress HENT normal E scleral and icterus clear Neck Supple JVP flat Clear to ausculation Regular rate and rhythm, no murmurs gallops or rub Soft with active bowel sounds No clubbing cyanosis none Edema Alert and  oriented, grossly normal motor and sensory function Skin Warm and Dry vitilgo  Electrocardiogram demonstrates atrial fibrillation at 68 Intervals-/0.09/0.411 Assessment and  Plan

## 2012-06-24 NOTE — Assessment & Plan Note (Signed)
Permanent on dabigitran

## 2012-11-14 ENCOUNTER — Encounter (HOSPITAL_COMMUNITY): Payer: Self-pay | Admitting: Emergency Medicine

## 2012-11-14 ENCOUNTER — Emergency Department (HOSPITAL_COMMUNITY)
Admission: EM | Admit: 2012-11-14 | Discharge: 2012-11-14 | Disposition: A | Discharge status: Home / Self Care | Payer: Medicare Other | Attending: Emergency Medicine | Admitting: Emergency Medicine

## 2012-11-14 DIAGNOSIS — Z8719 Personal history of other diseases of the digestive system: Secondary | ICD-10-CM | POA: Insufficient documentation

## 2012-11-14 DIAGNOSIS — Z79899 Other long term (current) drug therapy: Secondary | ICD-10-CM | POA: Insufficient documentation

## 2012-11-14 DIAGNOSIS — R21 Rash and other nonspecific skin eruption: Secondary | ICD-10-CM | POA: Insufficient documentation

## 2012-11-14 DIAGNOSIS — Z87891 Personal history of nicotine dependence: Secondary | ICD-10-CM | POA: Insufficient documentation

## 2012-11-14 DIAGNOSIS — I10 Essential (primary) hypertension: Secondary | ICD-10-CM | POA: Insufficient documentation

## 2012-11-14 DIAGNOSIS — Z8679 Personal history of other diseases of the circulatory system: Secondary | ICD-10-CM | POA: Insufficient documentation

## 2012-11-14 DIAGNOSIS — I2510 Atherosclerotic heart disease of native coronary artery without angina pectoris: Secondary | ICD-10-CM | POA: Insufficient documentation

## 2012-11-14 DIAGNOSIS — L089 Local infection of the skin and subcutaneous tissue, unspecified: Secondary | ICD-10-CM

## 2012-11-14 DIAGNOSIS — E876 Hypokalemia: Secondary | ICD-10-CM | POA: Insufficient documentation

## 2012-11-14 DIAGNOSIS — Z86718 Personal history of other venous thrombosis and embolism: Secondary | ICD-10-CM | POA: Insufficient documentation

## 2012-11-14 DIAGNOSIS — Z8669 Personal history of other diseases of the nervous system and sense organs: Secondary | ICD-10-CM | POA: Insufficient documentation

## 2012-11-14 DIAGNOSIS — Z872 Personal history of diseases of the skin and subcutaneous tissue: Secondary | ICD-10-CM | POA: Insufficient documentation

## 2012-11-14 DIAGNOSIS — L723 Sebaceous cyst: Secondary | ICD-10-CM | POA: Insufficient documentation

## 2012-11-14 DIAGNOSIS — Z87448 Personal history of other diseases of urinary system: Secondary | ICD-10-CM | POA: Insufficient documentation

## 2012-11-14 MED ORDER — CEPHALEXIN 500 MG PO CAPS: 500.0000 mg | ORAL_CAPSULE | Freq: Four times a day (QID) | ORAL | Status: DC

## 2012-11-14 NOTE — ED Provider Notes (Signed)
CSN: [id number]     Arrival date & time 11/14/12  7829 History     First MD Initiated Contact with Patient 11/14/12 [phone]     Chief Complaint  Patient presents with  . Abscess    HPI Pt with tender "spot" on lt cheek for 1 month.  More painful now, and bled 2 days ago.  No prior similar sx or treatment.  Past Medical History  Diagnosis Date  . Hypertension   . Coronary artery disease   . Diarrhea     secondary to Salmonella species  . Hypokalemia   . History of seizure disorder   . Nausea & vomiting   . Vitiligo   . Acute renal failure   . Deep venous thrombosis     Deep venous thrombosis prophylaxis  . Hiatal hernia 2003  . Atrial fibrillation    Past Surgical History  Procedure Laterality Date  . Hernia repair     Family History  Problem Relation Age of Onset  . Stroke Mother   . Heart attack Father   . Colon cancer Neg Hx   . Kidney Stones Father   . Diabetes Other     nephew   History  Substance Use Topics  . Smoking status: Former Games developer  . Smokeless tobacco: Never Used  . Alcohol Use: No    Review of Systems  Constitutional: Negative for fever, chills, diaphoresis, appetite change and fatigue.  HENT: Negative for sore throat, mouth sores and trouble swallowing.   Eyes: Negative for visual disturbance.  Respiratory: Negative for cough, chest tightness, shortness of breath and wheezing.   Cardiovascular: Negative for chest pain.  Gastrointestinal: Negative for nausea, vomiting, abdominal pain, diarrhea and abdominal distention.  Endocrine: Negative for polydipsia, polyphagia and polyuria.  Genitourinary: Negative for dysuria, frequency and hematuria.  Musculoskeletal: Negative for gait problem.  Skin: Positive for rash and wound. Negative for color change and pallor.  Neurological: Negative for dizziness, syncope, light-headedness and headaches.  Hematological: Does not bruise/bleed easily.  Psychiatric/Behavioral: Negative for behavioral problems and  confusion.    Allergies  Review of patient's allergies indicates no known allergies.  Home Medications   Current Outpatient Rx  Name  Route  Sig  Dispense  Refill  . benazepril (LOTENSIN) 20 MG tablet   Oral   Take 20 mg by mouth daily.         . dabigatran (PRADAXA) 150 MG CAPS   Oral   Take 150 mg by mouth every 12 (twelve) hours.         . finasteride (PROSCAR) 5 MG tablet   Oral   Take 5 mg by mouth daily.         . iron polysaccharides (FERREX 150) 150 MG capsule   Oral   Take 150 mg by mouth daily.         . metoprolol tartrate (LOPRESSOR) 25 MG tablet   Oral   Take 12.5 mg by mouth 2 (two) times daily.         Marland Kitchen PHENobarbital (LUMINAL) 97.2 MG tablet   Oral   Take 97.2 mg by mouth at bedtime.          . Pseudoephedrine-DM-GG (ROBITUSSIN COLD & COUGH PO)   Oral   Take 10 mLs by mouth as needed (for cough).          . vitamin B-12 (CYANOCOBALAMIN) 500 MCG tablet   Oral   Take 500 mcg by mouth daily.          Marland Kitchen  cephALEXin (KEFLEX) 500 MG capsule   Oral   Take 1 capsule (500 mg total) by mouth 4 (four) times daily.   20 capsule   0    BP 160/101  Pulse 90  Temp(Src) 97.2 F (36.2 C) (Oral)  Resp 18  SpO2 98% Physical Exam  HENT:  Head:      ED Course   INCISION AND DRAINAGE Date/Time: 11/14/2012 9:11 AM Performed by: Claudean Kinds Authorized by: Claudean Kinds Consent: Verbal consent obtained. Risks and benefits: risks, benefits and alternatives were discussed Consent given by: patient Patient understanding: patient states understanding of the procedure being performed Type: abscess Body area: head/neck Location details: face Anesthesia: local infiltration Local anesthetic: lidocaine 2% with epinephrine Anesthetic total: 0.5 ml Scalpel size: 11 Incision type: single straight Complexity: simple Drainage: purulent (purulent, and sebaceous) Wound treatment: wound left open Patient tolerance: Patient tolerated  the procedure well with no immediate complications.   (including critical care time)  Labs Reviewed - No data to display No results found. 1. Infected sebaceous cyst     MDM  DDX:  Abscess, cyst.  I&D shows sebum and purulence.  Claudean Kinds, MD 11/14/12 [phone]

## 2012-11-14 NOTE — ED Notes (Signed)
Pt reports painful, swollen area to left face for the last month. Denies recent fever, illness. Pt alert, oriented x4, NAD. Denies pain.

## 2012-12-22 ENCOUNTER — Ambulatory Visit (INDEPENDENT_AMBULATORY_CARE_PROVIDER_SITE_OTHER): Payer: Medicare Other | Admitting: Internal Medicine

## 2012-12-22 ENCOUNTER — Encounter: Payer: Self-pay | Admitting: Internal Medicine

## 2012-12-22 VITALS — BP 119/85 | HR 77 | Ht 72.5 in | Wt 199.4 lb

## 2012-12-22 DIAGNOSIS — I4891 Unspecified atrial fibrillation: Secondary | ICD-10-CM

## 2012-12-22 NOTE — Progress Notes (Signed)
Patient Care Team: Quitman Livings as PCP - General (Internal Medicine)   HPI  Donald Reynolds is a 79 y.o. male Seen in followup for atrial fibrillation and hypertension. He has a CHADS-VASc score of 4 and was started last year on Pradaxa  The patient denies chest pain, shortness of breath, nocturnal dyspnea, orthopnea or peripheral edema. There have been no palpitations, lightheadedness or syncope.   He is [facility] back from [facility] for reasons that are not clear. They did draw blood work. These results are not available  Past Medical History  Diagnosis Date  . Hypertension   . Coronary artery disease   . Diarrhea     secondary to Salmonella species  . Hypokalemia   . History of seizure disorder   . Nausea & vomiting   . Vitiligo   . Acute renal failure   . Deep venous thrombosis     Deep venous thrombosis prophylaxis  . Hiatal hernia 2003  . Atrial fibrillation     Past Surgical History  Procedure Laterality Date  . Hernia repair      Current Outpatient Prescriptions  Medication Sig Dispense Refill  . benazepril (LOTENSIN) 20 MG tablet Take 20 mg by mouth daily.      . cephALEXin (KEFLEX) 500 MG capsule Take 1 capsule (500 mg total) by mouth 4 (four) times daily.  20 capsule  0  . dabigatran (PRADAXA) 150 MG CAPS Take 150 mg by mouth every 12 (twelve) hours.      . finasteride (PROSCAR) 5 MG tablet Take 5 mg by mouth daily.      . iron polysaccharides (FERREX 150) 150 MG capsule Take 150 mg by mouth daily.      . metoprolol tartrate (LOPRESSOR) 25 MG tablet Take 12.5 mg by mouth 2 (two) times daily.      Marland Kitchen PHENobarbital (LUMINAL) 97.2 MG tablet Take 97.2 mg by mouth at bedtime.       . Pseudoephedrine-DM-GG (ROBITUSSIN COLD & COUGH PO) Take 10 mLs by mouth as needed (for cough).       . vitamin B-12 (CYANOCOBALAMIN) 500 MCG tablet Take 500 mcg by mouth daily.        No current facility-administered medications for this visit.    No Known Allergies  Review of  Systems negative except from HPI and PMH  Physical Exam BP 119/85  Pulse 77  Ht 6' 0.5" (1.842 m)  Wt 199 lb 6.4 oz (90.447 kg)  BMI 26.66 kg/m2 Well developed and well nourished in no acute distress HENT normal E scleral and icterus clear Neck Supple JVP flat; carotids brisk and full Clear to ausculation iRRegular rate and rhythm, no murmurs gallops or rub Soft with active bowel sounds No clubbing cyanosis trace Edema Alert and oriented, grossly normal motor and sensory function Skin Warm and Dry    Assessment and  Plan

## 2012-12-22 NOTE — Assessment & Plan Note (Addendum)
Rate control and without symptoms. On dabigitran. Renal function assessment is pending  Tried to get records from [facility]

## 2012-12-22 NOTE — Patient Instructions (Addendum)
Your physician wants you to follow-up in: 6 month with Harden Mo, PA You will receive a reminder letter in the mail two months in advance. If you don't receive a letter, please call our office to schedule the follow-up appointment.

## 2013-05-17 ENCOUNTER — Emergency Department (HOSPITAL_COMMUNITY): Payer: Medicare Other

## 2013-05-17 ENCOUNTER — Emergency Department (HOSPITAL_COMMUNITY)
Admission: EM | Admit: 2013-05-17 | Discharge: 2013-05-17 | Disposition: A | Discharge status: Home / Self Care | Payer: Medicare Other | Attending: Emergency Medicine | Admitting: Emergency Medicine

## 2013-05-17 ENCOUNTER — Encounter (HOSPITAL_COMMUNITY): Payer: Self-pay | Admitting: Emergency Medicine

## 2013-05-17 DIAGNOSIS — Z862 Personal history of diseases of the blood and blood-forming organs and certain disorders involving the immune mechanism: Secondary | ICD-10-CM | POA: Insufficient documentation

## 2013-05-17 DIAGNOSIS — S93492A Sprain of other ligament of left ankle, initial encounter: Secondary | ICD-10-CM

## 2013-05-17 DIAGNOSIS — S82839A Other fracture of upper and lower end of unspecified fibula, initial encounter for closed fracture: Secondary | ICD-10-CM

## 2013-05-17 DIAGNOSIS — Y929 Unspecified place or not applicable: Secondary | ICD-10-CM | POA: Insufficient documentation

## 2013-05-17 DIAGNOSIS — Z87448 Personal history of other diseases of urinary system: Secondary | ICD-10-CM | POA: Insufficient documentation

## 2013-05-17 DIAGNOSIS — Z79899 Other long term (current) drug therapy: Secondary | ICD-10-CM | POA: Insufficient documentation

## 2013-05-17 DIAGNOSIS — X58XXXA Exposure to other specified factors, initial encounter: Secondary | ICD-10-CM | POA: Insufficient documentation

## 2013-05-17 DIAGNOSIS — G40909 Epilepsy, unspecified, not intractable, without status epilepticus: Secondary | ICD-10-CM | POA: Insufficient documentation

## 2013-05-17 DIAGNOSIS — Z7902 Long term (current) use of antithrombotics/antiplatelets: Secondary | ICD-10-CM | POA: Insufficient documentation

## 2013-05-17 DIAGNOSIS — S82899A Other fracture of unspecified lower leg, initial encounter for closed fracture: Secondary | ICD-10-CM | POA: Insufficient documentation

## 2013-05-17 DIAGNOSIS — Z8719 Personal history of other diseases of the digestive system: Secondary | ICD-10-CM | POA: Insufficient documentation

## 2013-05-17 DIAGNOSIS — I2510 Atherosclerotic heart disease of native coronary artery without angina pectoris: Secondary | ICD-10-CM | POA: Insufficient documentation

## 2013-05-17 DIAGNOSIS — S93432A Sprain of tibiofibular ligament of left ankle, initial encounter: Secondary | ICD-10-CM

## 2013-05-17 DIAGNOSIS — Z87891 Personal history of nicotine dependence: Secondary | ICD-10-CM | POA: Insufficient documentation

## 2013-05-17 DIAGNOSIS — S93409A Sprain of unspecified ligament of unspecified ankle, initial encounter: Secondary | ICD-10-CM | POA: Insufficient documentation

## 2013-05-17 DIAGNOSIS — I509 Heart failure, unspecified: Secondary | ICD-10-CM | POA: Insufficient documentation

## 2013-05-17 DIAGNOSIS — Z8639 Personal history of other endocrine, nutritional and metabolic disease: Secondary | ICD-10-CM | POA: Insufficient documentation

## 2013-05-17 DIAGNOSIS — I10 Essential (primary) hypertension: Secondary | ICD-10-CM | POA: Insufficient documentation

## 2013-05-17 DIAGNOSIS — Y939 Activity, unspecified: Secondary | ICD-10-CM | POA: Insufficient documentation

## 2013-05-17 DIAGNOSIS — Z86718 Personal history of other venous thrombosis and embolism: Secondary | ICD-10-CM | POA: Insufficient documentation

## 2013-05-17 HISTORY — DX: Heart failure, unspecified: I50.9

## 2013-05-17 MED ORDER — AIRGO ROLLING WALKER MISC: 1.0000 [IU] | Freq: Every day | Status: DC

## 2013-05-17 MED ORDER — HYDROCODONE-ACETAMINOPHEN 5-325 MG PO TABS
1.0000 | ORAL_TABLET | Freq: Once | ORAL | Status: AC
  Administered 2013-05-17: 1 via ORAL
  Filled 2013-05-17: qty 1

## 2013-05-17 MED ORDER — HYDROCODONE-ACETAMINOPHEN 5-325 MG PO TABS: ORAL_TABLET | ORAL | Status: DC

## 2013-05-17 MED ORDER — DOCUSATE SODIUM 100 MG PO CAPS: 100.0000 mg | ORAL_CAPSULE | Freq: Two times a day (BID) | ORAL | Status: DC | PRN

## 2013-05-17 NOTE — ED Notes (Addendum)
EMS reports pt has swelling in legs and left ankle with pain. Pt states symptoms are new, sister states he has history of CHF, HTN, Diabetes. CBG 61 A&O x 4

## 2013-05-17 NOTE — ED Notes (Signed)
Bed: [id number] Expected date: 05/17/13 Expected time: 7:28 AM Means of arrival:  Comments: EMS

## 2013-05-17 NOTE — ED Provider Notes (Signed)
CSN: [id number]     Arrival date & time 05/17/13  4098 History   First MD Initiated Contact with Patient 05/17/13 0757     Chief Complaint  Patient presents with  . Leg Swelling  . Ankle Pain   (Consider location/radiation/quality/duration/timing/severity/associated sxs/prior Treatment) Patient is a 80 y.o. male presenting with ankle pain. The history is provided by the patient and a relative.  Ankle Pain Location:  Ankle Time since incident:  2 hours Injury: no   Ankle location:  L ankle Pain details:    Quality:  Shooting and sharp   Radiates to:  Does not radiate   Severity:  Severe   Onset quality:  Sudden   Timing:  Constant   Progression:  Unchanged Chronicity:  New Dislocation: no   Prior injury to area:  No Relieved by:  Rest Worsened by:  Activity, bearing weight, abduction, adduction, rotation, flexion and extension Ineffective treatments:  None tried Associated symptoms: decreased ROM and swelling   Associated symptoms: no back pain, no fever, no muscle weakness, no numbness and no tingling   Risk factors: no concern for non-accidental trauma and no frequent fractures     Past Medical History  Diagnosis Date  . Hypertension   . Coronary artery disease   . Diarrhea     secondary to Salmonella species  . Hypokalemia   . History of seizure disorder   . Nausea & vomiting   . Vitiligo   . Acute renal failure   . Deep venous thrombosis     Deep venous thrombosis prophylaxis  . Hiatal hernia 2003  . Atrial fibrillation   . CHF (congestive heart failure)    Past Surgical History  Procedure Laterality Date  . Hernia repair     Family History  Problem Relation Age of Onset  . Stroke Mother   . Heart attack Father   . Kidney Stones Father   . Colon cancer Neg Hx   . Diabetes Other     nephew  . Gout Brother    History  Substance Use Topics  . Smoking status: Former Games developer  . Smokeless tobacco: Never Used  . Alcohol Use: No    Review of Systems   Constitutional: Negative for fever and chills.  Respiratory: Negative for shortness of breath.   Cardiovascular: Negative for chest pain.  Musculoskeletal: Positive for arthralgias. Negative for back pain.  Skin: Negative for color change, rash and wound.  Neurological: Negative for weakness and numbness.    Allergies  Review of patient's allergies indicates no known allergies.  Home Medications   Current Outpatient Rx  Name  Route  Sig  Dispense  Refill  . benazepril (LOTENSIN) 20 MG tablet   Oral   Take 20 mg by mouth daily.         . cholecalciferol (VITAMIN D) 1000 UNITS tablet   Oral   Take 1,000 Units by mouth daily.         . dabigatran (PRADAXA) 150 MG CAPS   Oral   Take 150 mg by mouth every 12 (twelve) hours.         . iron polysaccharides (FERREX 150) 150 MG capsule   Oral   Take 150 mg by mouth daily.         . metoprolol tartrate (LOPRESSOR) 25 MG tablet   Oral   Take 12.5 mg by mouth 2 (two) times daily.         Marland Kitchen PHENobarbital (LUMINAL) 97.2 MG tablet  Oral   Take 97.2 mg by mouth at bedtime.          . Pseudoephedrine-DM-GG (ROBITUSSIN COLD & COUGH PO)   Oral   Take 10 mLs by mouth as needed (for cough).          . vitamin B-12 (CYANOCOBALAMIN) 500 MCG tablet   Oral   Take 500 mcg by mouth daily.          . vitamin E 400 UNIT capsule   Oral   Take 400 Units by mouth daily.         Marland Kitchen docusate sodium (COLACE) 100 MG capsule   Oral   Take 1 capsule (100 mg total) by mouth 2 (two) times daily as needed for mild constipation.   30 capsule   0   . HYDROcodone-acetaminophen (NORCO/VICODIN) 5-325 MG per tablet      1-2 tablets po q 6 hours prn moderate to severe pain   20 tablet   0    BP 159/82  Pulse 78  Temp(Src) 99 F (37.2 C) (Oral)  Resp 20  SpO2 97% Physical Exam  Nursing note and vitals reviewed. Constitutional: He appears well-developed and well-nourished. No distress.  HENT:  Head: Normocephalic.   Cardiovascular: Normal rate and intact distal pulses.   Pulmonary/Chest: Effort normal. No respiratory distress. He has no wheezes. He has no rales.  Abdominal: Soft.  Musculoskeletal: He exhibits tenderness.       Left knee: He exhibits normal range of motion and no swelling. No tenderness found.       Left ankle: He exhibits decreased range of motion and swelling. He exhibits no deformity. Tenderness. Lateral malleolus and medial malleolus tenderness found. No head of 5th metatarsal and no proximal fibula tenderness found. Achilles tendon normal. Achilles tendon exhibits normal Thompson's test results.       Left lower leg: He exhibits no tenderness, no bony tenderness, no swelling and no edema.       Left foot: He exhibits no tenderness, no bony tenderness, normal capillary refill, no deformity and no laceration.  Skin: Skin is warm. He is not diaphoretic.    ED Course  Procedures (including critical care time) Labs Review Labs Reviewed - No data to display Imaging Review Dg Ankle Complete Left  05/17/2013   CLINICAL DATA:  Swelling and pain  EXAM: LEFT ANKLE COMPLETE - 3+ VIEW  COMPARISON:  None.  FINDINGS: There are some calcific densities inferior to the medial malleolus. There is an acute appearing cortical disruption at the tip of the lateral malleolus. No displaced fragment. Ankle mortise intact. Diffuse osteopenia. Small calcaneal spur.  IMPRESSION: 1. Minimally displaced lateral malleolar fracture. 2. Possible avulsion fracture from the medial malleolus, age indeterminate. Correlate with point tenderness.   Electronically Signed   By: Oley Balm M.D.   On: 05/17/2013 08:58    EKG Interpretation   None      RA sat is 97% and I interpret to be normal  9:05 AM Pt with acute distal, probably avulsion fracture of fibula, possibly of tibia.  Pt may not recall, but it seems he may have sprained his ankle more severely than he thought, or simply doesn't recall.  Will place in CAM  boot and advise walker and follow up with orthopedist.  RICE at home.     MDM   1. Avulsion fracture of distal fibula   2. High ankle sprain of left lower extremity      Pt with left  ankle pain when he tried to get up to go to the bathroom,, severe pain with weight bear, worse with movement.  Mildly warm to touch especially medial left ankle.  Limited ROM, could be gout.  Pt's brother has gout. Pt has no prior history.  Pt has h/o DVT in the past by older notes.  Pt and family did not mention.  Pt is on pradaxa.      Gavin PoundMichael Y. Oletta LamasGhim, MD 05/17/13 [phone]

## 2013-05-17 NOTE — Discharge Instructions (Signed)
Narcotic and benzodiazepine use may cause drowsiness, slowed breathing or dependence.  Please use with caution and do not drive, operate machinery or watch young children alone while taking them.  Taking combinations of these medications or drinking alcohol will potentiate these effects.    

## 2013-05-17 NOTE — ED Notes (Signed)
He states that upon arising to go to b.r. This morning at ~0500 he noted his left ankle to be "real sore and painful".  He further subsequently noted his bilat. Lower legs/ankles "are swolled; and they're usually not swolled".  He denies any fever, nor any other sign of recent illness.  He denies shortness of breath.  His skin is normal, warm and dry and he is breathing normally.  He also denies any trauma/injury of any kind.

## 2013-05-20 NOTE — Progress Notes (Signed)
Incoming call from patients sister Majel Homernn Cumbee .She reports [facility]  Till March.CM encouraged Ann  To call office back and see if they have anything sooner and also to follow up with PCP on Monday at scheduled appointment .Dewayne Hatchnn reports she states this is a   BaristaGood plan and she will liaise with her brothers PCP.

## 2013-06-30 ENCOUNTER — Ambulatory Visit (INDEPENDENT_AMBULATORY_CARE_PROVIDER_SITE_OTHER): Payer: Commercial Managed Care - HMO | Admitting: Internal Medicine

## 2013-06-30 ENCOUNTER — Encounter: Payer: Self-pay | Admitting: Internal Medicine

## 2013-06-30 VITALS — BP 140/87 | HR 72 | Ht 72.5 in | Wt 200.0 lb

## 2013-06-30 DIAGNOSIS — I4891 Unspecified atrial fibrillation: Secondary | ICD-10-CM

## 2013-06-30 DIAGNOSIS — R569 Unspecified convulsions: Secondary | ICD-10-CM | POA: Diagnosis not present

## 2013-06-30 NOTE — Patient Instructions (Signed)
Your physician recommends that you continue on your current medications as directed. Please refer to the Current Medication list given to you today.  Your physician wants you to follow-up in: 1 year with Dr. Klein.  You will receive a reminder letter in the mail two months in advance. If you don't receive a letter, please call our office to schedule the follow-up appointment.  

## 2013-06-30 NOTE — Progress Notes (Signed)
Patient Care Team: Quitman LivingsSami Hassan as PCP - General (Internal Medicine)   HPI  Donald RoseWilliam Reynolds is a 80 y.o. male Seen in followup for atrial fibrillation and hypertension. He has a CHADS-VASc score of 4 and was started last year on Pradaxa  The patient denies chest pain, shortness of breath, nocturnal dyspnea, orthopnea   He is having problems primarily with urination. Frequency and urgency.   He is not having significant sodium intake although he does have edema  Past Medical History  Diagnosis Date  . Hypertension   . Coronary artery disease   . Diarrhea     secondary to Salmonella species  . Hypokalemia   . History of seizure disorder   . Nausea & vomiting   . Vitiligo   . Acute renal failure   . Deep venous thrombosis     Deep venous thrombosis prophylaxis  . Hiatal hernia 2003  . Atrial fibrillation   . CHF (congestive heart failure)     Past Surgical History  Procedure Laterality Date  . Hernia repair      Current Outpatient Prescriptions  Medication Sig Dispense Refill  . benazepril (LOTENSIN) 20 MG tablet Take 20 mg by mouth daily.      . cholecalciferol (VITAMIN D) 1000 UNITS tablet Take 2,000 Units by mouth daily.       . dabigatran (PRADAXA) 150 MG CAPS Take 150 mg by mouth every 12 (twelve) hours.      . docusate sodium (COLACE) 100 MG capsule Take 1 capsule (100 mg total) by mouth 2 (two) times daily as needed for mild constipation.  30 capsule  0  . iron polysaccharides (FERREX 150) 150 MG capsule Take 150 mg by mouth daily.      . metoprolol tartrate (LOPRESSOR) 25 MG tablet Take 12.5 mg by mouth 2 (two) times daily.      . Misc. Devices (AIRGO ROLLING WALKER) MISC 1 Units by Does not apply route daily.  1 each  0  . PHENobarbital (LUMINAL) 97.2 MG tablet Take 97.2 mg by mouth at bedtime.       . Pseudoephedrine-DM-GG (ROBITUSSIN COLD & COUGH PO) Take 10 mLs by mouth as needed (for cough).       . vitamin B-12 (CYANOCOBALAMIN) 500 MCG tablet Take  500 mcg by mouth daily.       . vitamin E 400 UNIT capsule Take 400 Units by mouth daily.       No current facility-administered medications for this visit.    No Known Allergies  Review of Systems negative except from HPI and PMH  Physical Exam BP 140/87  Pulse 72  Ht 6' 0.5" (1.842 m)  Wt 200 lb (90.719 kg)  BMI 26.74 kg/m2 Well developed and well nourished in no acute distress HENT normal E scleral and icterus clear Neck Supple JVP flat; carotids brisk and full Clear to ausculation Irregularly irregular rate and rhythm, no murmurs gallops or rub Soft with active bowel sounds No clubbing cyanosis  2+ Edema Alert and oriented, grossly normal motor and sensory function Skin Warm and Dry  ECG demonstrates atrial fibrillation at 71 with intervals-/08 / 40  axis60  Assessment and  Plan Atrial fibrillation   Hypertension  Peripheral edema  Seizure disorder  Donald Reynolds is doing quite well from a severe point review. He has significant peripheral edema. I am a little bit reluctant to begin him on a diuretic given the fact be due to problem with  urination. He is to see his urologist next week. Once we have urinary flow back towards normal, if edema persists, would use low-dose diuretic probably adding ACT to his  Avapro.  We'll arrange for neurological consultation given seizure disorder.

## 2013-07-09 ENCOUNTER — Encounter: Payer: Self-pay | Admitting: Neurology

## 2013-07-09 ENCOUNTER — Ambulatory Visit (INDEPENDENT_AMBULATORY_CARE_PROVIDER_SITE_OTHER): Payer: Commercial Managed Care - HMO | Admitting: Neurology

## 2013-07-09 VITALS — BP 140/80 | HR 74 | Temp 98.4°F | Ht 72.0 in | Wt 202.0 lb

## 2013-07-09 DIAGNOSIS — R569 Unspecified convulsions: Secondary | ICD-10-CM

## 2013-07-09 NOTE — Patient Instructions (Addendum)
1. Continue Phenobarbital 97.2mg  at bedtime 2. Bloodwork for Phenobarbital level 3. We will obtain records from Dr. Ewell PoeAnderson's office 4. Follow-up in 1 year

## 2013-07-09 NOTE — Progress Notes (Signed)
NEUROLOGY CONSULTATION NOTE  Donald Reynolds MRN: [id number] DOB: [birth year]  Referring provider: Dr. Sherryl Manges Primary care provider: Dr. Dayton Scrape  Reason for consult:  Seizure medication management  Dear Dr Graciela Husbands:  Thank you for your kind referral of Donald Reynolds for consultation of the above symptoms. Although his history is well known to you, please allow me to reiterate it for the purpose of our medical record. The patient was accompanied to the clinic by his sister who also provides collateral information.   HISTORY OF PRESENT ILLNESS: This is a very pleasant 80 year old right-handed man with a history of atrial fibrillation on anticoagulation, vitiligo, hypertension, and seizure disorder, presenting to establish care for his seizures.  He and his sister report he had grand mal seizrues in his 42s and was started on phenobarbital.  He has been taking the same dose 97.2mg  qhs since then.  He denies any prior warning symptoms to the seizures.  His sister reported he would pass out, start having chewing movements, then stiffen and convulse. His last convulsion was 50 years ago.  In 1985 (30 years ago), his mother passed away and he stopped all his medications and lost consciousness without witnessed shaking.  He was brought to [facility] and episode was attributed to stopping all his medications, unclear if seizure versus syncope.  They deny any staring/unresponsive episodes, no olfactory/gustatory hallucinations, rising epigastric sensation, focal numbness/tingling/weakness, myoclonic jerks.  He denies any headaches, dizziness, diplopia, dysarthria, dysphagia, neck/back pain, bowel incontinence.  He has some urinary frequency.  He fell last month and sustained an ankle fracture and has been discharged recently from Ortho.  He has been using a cane since.    Epilepsy Risk Factors:  He had a normal birth and early development.  There is no history of febrile convulsions, CNS infections  such as meningitis/encephalitis, significant traumatic brain injury, neurosurgical procedures, or family history of seizures.  Laboratory Data: Last Phenobarbital level on file from 2011 was 7.2.  They report he had bloodwork done 1-1/2 years ago with his PCP, results unavailable for review.  I personally reviewed MRI brain done in 2010 showing moderate to severe atrophy, advanced chronic microvascular ischemic change in the periventricular and subcortical white matter regions.  No EEG available for review.  PAST MEDICAL HISTORY: Past Medical History  Diagnosis Date  . Hypertension   . Coronary artery disease   . Diarrhea     secondary to Salmonella species  . Hypokalemia   . History of seizure disorder   . Nausea & vomiting   . Vitiligo   . Acute renal failure   . Deep venous thrombosis     Deep venous thrombosis prophylaxis  . Hiatal hernia 2003  . Atrial fibrillation   . CHF (congestive heart failure)     PAST SURGICAL HISTORY: Past Surgical History  Procedure Laterality Date  . Hernia repair      MEDICATIONS: Current Outpatient Prescriptions on File Prior to Visit  Medication Sig Dispense Refill  . benazepril (LOTENSIN) 20 MG tablet Take 20 mg by mouth daily.      . cholecalciferol (VITAMIN D) 1000 UNITS tablet Take 2,000 Units by mouth daily.       . dabigatran (PRADAXA) 150 MG CAPS Take 150 mg by mouth every 12 (twelve) hours.      . docusate sodium (COLACE) 100 MG capsule Take 1 capsule (100 mg total) by mouth 2 (two) times daily as needed for mild constipation.  30 capsule  0  . iron polysaccharides (FERREX 150) 150 MG capsule Take 150 mg by mouth daily.      . metoprolol tartrate (LOPRESSOR) 25 MG tablet Take 12.5 mg by mouth 2 (two) times daily.      . Misc. Devices (AIRGO ROLLING WALKER) MISC 1 Units by Does not apply route daily.  1 each  0  . PHENobarbital (LUMINAL) 97.2 MG tablet Take 97.2 mg by mouth at bedtime.       . Pseudoephedrine-DM-GG (ROBITUSSIN COLD &  COUGH PO) Take 10 mLs by mouth as needed (for cough).       . vitamin B-12 (CYANOCOBALAMIN) 500 MCG tablet Take 500 mcg by mouth daily.       . vitamin E 400 UNIT capsule Take 400 Units by mouth daily.       No current facility-administered medications on file prior to visit.    ALLERGIES: No Known Allergies  FAMILY HISTORY: Family History  Problem Relation Age of Onset  . Stroke Mother   . Heart attack Father   . Kidney Stones Father   . Colon cancer Neg Hx   . Diabetes Other     nephew  . Gout Brother     SOCIAL HISTORY: History   Social History  . Marital Status: Single    Spouse Name: N/A    Number of Children: N/A  . Years of Education: N/A   Occupational History  . Not on file.   Social History Main Topics  . Smoking status: Former Games developermoker  . Smokeless tobacco: Never Used  . Alcohol Use: No  . Drug Use: No  . Sexual Activity: Not on file   Other Topics Concern  . Not on file   Social History Narrative  . No narrative on file    REVIEW OF SYSTEMS: Constitutional: No fevers, chills, or sweats, no generalized fatigue, change in appetite Eyes: No visual changes, double vision, eye pain Ear, nose and throat: No hearing loss, ear pain, nasal congestion, sore throat Cardiovascular: No chest pain, palpitations Respiratory:  No shortness of breath at rest or with exertion, wheezes GastrointestinaI: No nausea, vomiting, diarrhea, abdominal pain, fecal incontinence Genitourinary:  No dysuria, urinary retention. + frequency Musculoskeletal:  No neck pain, back pain Integumentary: No rash, pruritus, +vitiligo Neurological: as above Psychiatric: No depression, insomnia, anxiety Endocrine: No palpitations, fatigue, diaphoresis, mood swings, change in appetite, change in weight, increased thirst Hematologic/Lymphatic:  No anemia, purpura, petechiae. Allergic/Immunologic: no itchy/runny eyes, nasal congestion, recent allergic reactions, rashes  PHYSICAL  EXAM: Filed Vitals:   07/09/13 1033  BP: 140/80  Pulse: 74  Temp: 98.4 F (36.9 C)   General: No acute distress Head:  Normocephalic/atraumatic Neck: supple, no paraspinal tenderness, full range of motion Back: No paraspinal tenderness Heart: regular rate and rhythm Lungs: Clear to auscultation bilaterally. Vascular: No carotid bruits. Skin/Extremities: No rash, no edema. Vitiligo Neurological Exam: Mental status: alert and oriented to person, place. Does not know the month or year. Knows the president. No dysarthria or aphasia. Fund of knowledge is appropriate. Remote memory intact, difficulties with recent memory.  Attention span and concentration normal.  Repeats and names without difficulty. Cranial nerves: CN I: not tested CN II: pupils equal, round and reactive to light, visual fields intact, fundi unremarkable. CN III, IV, VI:  full range of motion, no nystagmus, no ptosis CN V: facial sensation intact CN VII: upper and lower face symmetric CN VIII: hearing intact CN IX, X: gag intact, uvula midline CN XI:  sternocleidomastoid and trapezius muscles intact CN XII: tongue midline Bulk & Tone: normal, no fasciculations. Motor: 5/5 throughout with no pronator drift. Sensation: intact to light touch, cold, pin, vibration and joint position sense.  No extinction to double simultaneous stimulation.  Deep Tendon Reflexes: +2 throughout except for absent ankle jerks bilaterally. no ankle clonus Plantar responses: downgoing bilaterally Finger to nose testing: no incoordination on finger to nose Gait: slow and cautious favoring left ankle due to recent fracture, uses cane for support. No ataxia, unable to tandem walk  IMPRESSION: This is a very pleasant 80 year old right-handed man with a history of atrial fibrillation on chronic anticoagulation with Pradaxa, hypertension, and history of seizures.  He has been seizure-free for at least 50 years.  He has been taking Phenobarbital  97.2mg  since his 60s.  We discussed the option of tapering off the medication as he has been seizure-free for many years, understanding the risks of breakthrough seizure with any medication adjustments.  They are understandably very hesitant about this, and we have agreed that since he is tolerating the medication with no side effects, he will continue current dose for now.  Check phenobarbital level.  We discussed risks of bone loss on phenobarbital, his sister reports that he was started on vitamin D recently.  Continue vitamin D with calcium supplementation.  We discussed risks of falls and fractures, he knows to use the cane for support regularly.  He is not driving.  He will follow-up in 1 year or earlier if needed.    Thank you for allowing me to participate in the care of this patient. Please do not hesitate to call for any questions or concerns.   Patrcia Dolly, M.D.

## 2013-07-10 LAB — PHENOBARBITAL LEVEL: PHENOBARBITAL: 5.3 mg/L — AB (ref 15.0–40.0)

## 2013-07-13 ENCOUNTER — Telehealth: Payer: Self-pay | Admitting: Neurology

## 2013-12-08 ENCOUNTER — Telehealth: Payer: Self-pay | Admitting: Family Medicine

## 2013-12-08 ENCOUNTER — Ambulatory Visit (INDEPENDENT_AMBULATORY_CARE_PROVIDER_SITE_OTHER): Payer: Commercial Managed Care - HMO | Admitting: Family Medicine

## 2013-12-08 VITALS — BP 142/88 | HR 78 | Temp 98.0°F | Resp 17 | Ht 70.5 in | Wt 193.0 lb

## 2013-12-08 DIAGNOSIS — M25569 Pain in unspecified knee: Secondary | ICD-10-CM

## 2013-12-08 MED ORDER — METHYLPREDNISOLONE ACETATE 80 MG/ML IJ SUSP
80.0000 mg | Freq: Once | INTRAMUSCULAR | Status: AC
  Administered 2013-12-08: 80 mg via INTRA_ARTICULAR

## 2013-12-08 MED ORDER — METHYLPREDNISOLONE ACETATE 80 MG/ML IJ SUSP: 80.0000 mg | Freq: Once | INTRAMUSCULAR | Status: DC

## 2013-12-08 NOTE — Progress Notes (Signed)
This is an 80 year old man who comes in after having fallen because his right leg gave out on him. He's had some knee pain intermittently. He also has an unstable gait. Aref he's recently seen a neurologist for his seizure disorder.  Patient's had problems with his left knee in the past but today he is complaining about the right knee and lower leg.  Objective: The right knee does show crepitus with range of motion. Is warm and he has a mild effusion. There is no erythema or skin breaks. There is no contusion as evidenced by lack of ecchymosis.  Patient has no point tenderness. Ligaments seem to be intact.  After sterile prep with Betadine, the right knee was injected with Depo-Medrol 80 mg and 1 cc of Xylocaine. Patient said his knee felt much better.  Assessment: Right knee osteoarthritis  Plan: Wait and see how the Depo-Medrol works and return as necessary.  Signed, Sheila Oats.D.

## 2013-12-08 NOTE — Telephone Encounter (Signed)
Patient was wanting to be seen at [facility] for a left lower leg fracture that occured in March. I have no record of you seeing this patient in Epic but the back of his [employer] card has your name with the [facility] number. He needs to have a referral to be seen there. He will be coming in for a walk in [facility] this week to get a referral.

## 2014-01-11 ENCOUNTER — Emergency Department (HOSPITAL_COMMUNITY): Payer: Medicare HMO

## 2014-01-11 ENCOUNTER — Encounter (HOSPITAL_COMMUNITY): Payer: Self-pay | Admitting: Emergency Medicine

## 2014-01-11 ENCOUNTER — Emergency Department (HOSPITAL_COMMUNITY)
Admission: EM | Admit: 2014-01-11 | Discharge: 2014-01-11 | Disposition: A | Discharge status: Home / Self Care | Payer: Medicare HMO | Attending: Emergency Medicine | Admitting: Emergency Medicine

## 2014-01-11 DIAGNOSIS — D649 Anemia, unspecified: Secondary | ICD-10-CM | POA: Diagnosis not present

## 2014-01-11 DIAGNOSIS — I498 Other specified cardiac arrhythmias: Secondary | ICD-10-CM | POA: Insufficient documentation

## 2014-01-11 DIAGNOSIS — I2510 Atherosclerotic heart disease of native coronary artery without angina pectoris: Secondary | ICD-10-CM | POA: Insufficient documentation

## 2014-01-11 DIAGNOSIS — K130 Diseases of lips: Secondary | ICD-10-CM | POA: Insufficient documentation

## 2014-01-11 DIAGNOSIS — Z8719 Personal history of other diseases of the digestive system: Secondary | ICD-10-CM | POA: Diagnosis not present

## 2014-01-11 DIAGNOSIS — R609 Edema, unspecified: Secondary | ICD-10-CM | POA: Insufficient documentation

## 2014-01-11 DIAGNOSIS — I10 Essential (primary) hypertension: Secondary | ICD-10-CM | POA: Insufficient documentation

## 2014-01-11 DIAGNOSIS — I509 Heart failure, unspecified: Secondary | ICD-10-CM | POA: Insufficient documentation

## 2014-01-11 DIAGNOSIS — Z87891 Personal history of nicotine dependence: Secondary | ICD-10-CM | POA: Diagnosis not present

## 2014-01-11 DIAGNOSIS — M129 Arthropathy, unspecified: Secondary | ICD-10-CM | POA: Insufficient documentation

## 2014-01-11 DIAGNOSIS — M7989 Other specified soft tissue disorders: Secondary | ICD-10-CM | POA: Insufficient documentation

## 2014-01-11 DIAGNOSIS — Z79899 Other long term (current) drug therapy: Secondary | ICD-10-CM | POA: Diagnosis not present

## 2014-01-11 DIAGNOSIS — R011 Cardiac murmur, unspecified: Secondary | ICD-10-CM | POA: Diagnosis not present

## 2014-01-11 DIAGNOSIS — R600 Localized edema: Secondary | ICD-10-CM

## 2014-01-11 DIAGNOSIS — L03116 Cellulitis of left lower limb: Secondary | ICD-10-CM

## 2014-01-11 LAB — CBC WITH DIFFERENTIAL/PLATELET
Basophils Absolute: 0 10*3/uL (ref 0.0–0.1)
Basophils Relative: 1 % (ref 0–1)
EOS PCT: 0 % (ref 0–5)
Eosinophils Absolute: 0 10*3/uL (ref 0.0–0.7)
HCT: 32.2 % — ABNORMAL LOW (ref 39.0–52.0)
Hemoglobin: 11 g/dL — ABNORMAL LOW (ref 13.0–17.0)
LYMPHS PCT: 18 % (ref 12–46)
Lymphs Abs: 0.8 10*3/uL (ref 0.7–4.0)
MCH: 30.5 pg (ref 26.0–34.0)
MCHC: 34.2 g/dL (ref 30.0–36.0)
MCV: 89.2 fL (ref 78.0–100.0)
Monocytes Absolute: 0.3 10*3/uL (ref 0.1–1.0)
Monocytes Relative: 7 % (ref 3–12)
NEUTROS ABS: 3.2 10*3/uL (ref 1.7–7.7)
NEUTROS PCT: 74 % (ref 43–77)
Platelets: 160 10*3/uL (ref 150–400)
RBC: 3.61 MIL/uL — ABNORMAL LOW (ref 4.22–5.81)
RDW: 13.5 % (ref 11.5–15.5)
WBC: 4.2 10*3/uL (ref 4.0–10.5)

## 2014-01-11 LAB — COMPREHENSIVE METABOLIC PANEL
ALT: 8 U/L (ref 0–53)
AST: 13 U/L (ref 0–37)
Albumin: 3.2 g/dL — ABNORMAL LOW (ref 3.5–5.2)
Alkaline Phosphatase: 83 U/L (ref 39–117)
Anion gap: 9 (ref 5–15)
BUN: 16 mg/dL (ref 6–23)
CALCIUM: 8.9 mg/dL (ref 8.4–10.5)
CHLORIDE: 104 meq/L (ref 96–112)
CO2: 27 mEq/L (ref 19–32)
Creatinine, Ser: 1.08 mg/dL (ref 0.50–1.35)
GFR calc Af Amer: 70 mL/min — ABNORMAL LOW (ref >90)
GFR, EST NON AFRICAN AMERICAN: 60 mL/min — AB (ref >90)
Glucose, Bld: 89 mg/dL (ref 70–99)
Potassium: 4.3 mEq/L (ref 3.7–5.3)
SODIUM: 140 meq/L (ref 137–147)
Total Bilirubin: 0.4 mg/dL (ref 0.3–1.2)
Total Protein: 6.2 g/dL (ref 6.0–8.3)

## 2014-01-11 LAB — PRO B NATRIURETIC PEPTIDE: Pro B Natriuretic peptide (BNP): 2860 pg/mL — ABNORMAL HIGH (ref 0–450)

## 2014-01-11 MED ORDER — CEPHALEXIN 500 MG PO CAPS: 500.0000 mg | ORAL_CAPSULE | Freq: Four times a day (QID) | ORAL | Status: DC

## 2014-01-11 MED ORDER — HYDROCODONE-ACETAMINOPHEN 5-325 MG PO TABS: 1.0000 | ORAL_TABLET | Freq: Four times a day (QID) | ORAL | Status: DC | PRN

## 2014-01-11 MED ORDER — HYDROCODONE-ACETAMINOPHEN 5-325 MG PO TABS
1.0000 | ORAL_TABLET | Freq: Once | ORAL | Status: AC
  Administered 2014-01-11: 1 via ORAL
  Filled 2014-01-11: qty 1

## 2014-01-11 NOTE — Discharge Instructions (Signed)
Read the information below.  Use the prescribed medication as directed.  Please discuss all new medications with your pharmacist.  Do not take additional tylenol while taking the prescribed pain medication to avoid overdose.  You may return to the Emergency Department at any time for worsening condition or any new symptoms that concern you.  If you develop increased redness, swelling, pus draining your leg, uncontrolled pain, or fevers greater than 100.4, return to the ER immediately for a recheck.      Cellulitis Cellulitis is an infection of the skin and the tissue under the skin. The infected area is usually red and tender. This happens most often in the arms and lower legs. HOME CARE   Take your antibiotic medicine as told. Finish the medicine even if you start to feel better.  Keep the infected arm or leg raised (elevated).  Put a warm cloth on the area up to 4 times per day.  Only take medicines as told by your doctor.  Keep all doctor visits as told. GET HELP IF:  You see red streaks on the skin coming from the infected area.  Your red area gets bigger or turns a dark color.  Your bone or joint under the infected area is painful after the skin heals.  Your infection comes back in the same area or different area.  You have a puffy (swollen) bump in the infected area.  You have new symptoms.  You have a fever. GET HELP RIGHT AWAY IF:   You feel very sleepy.  You throw up (vomit) or have watery poop (diarrhea).  You feel sick and have muscle aches and pains. MAKE SURE YOU:   Understand these instructions.  Will watch your condition.  Will get help right away if you are not doing well or get worse. Document Released: 09/19/2007 Document Revised: 08/17/2013 Document Reviewed: 06/18/2011 [facility] Patient Information 2015 [place], [place]. This information is not intended to replace advice given to you by your health care provider. Make sure you discuss any questions you  have with your health care provider.  Edema Edema is an abnormal buildup of fluids. It is more common in your legs and thighs. Painless swelling of the feet and ankles is more likely as a person ages. It also is common in looser skin, like around your eyes. HOME CARE   Keep the affected body part above the level of the heart while lying down.  Do not sit still or stand for a long time.  Do not put anything right under your knees when you lie down.  Do not wear tight clothes on your upper legs.  Exercise your legs to help the puffiness (swelling) go down.  Wear elastic bandages or support stockings as told by your doctor.  A low-salt diet may help lessen the puffiness.  Only take medicine as told by your doctor. GET HELP IF:  Treatment is not working.  You have heart, liver, or kidney disease and notice that your skin looks puffy or shiny.  You have puffiness in your legs that does not get better when you raise your legs.  You have sudden weight gain for no reason. GET HELP RIGHT AWAY IF:   You have shortness of breath or chest pain.  You cannot breathe when you lie down.  You have pain, redness, or warmth in the areas that are puffy.  You have heart, liver, or kidney disease and get edema all of a sudden.  You have a fever  and your symptoms get worse all of a sudden. MAKE SURE YOU:   Understand these instructions.  Will watch your condition.  Will get help right away if you are not doing well or get worse. Document Released: 09/19/2007 Document Revised: 04/07/2013 Document Reviewed: 01/23/2013 [facility] Patient Information 2015 [place], [place]. This information is not intended to replace advice given to you by your health care provider. Make sure you discuss any questions you have with your health care provider.

## 2014-01-11 NOTE — ED Notes (Signed)
Pt here for swelling of left leg, ambulatory with ems, pt reports hx of same to right leg and seen at [facility]

## 2014-01-11 NOTE — ED Provider Notes (Signed)
CSN: [id number]     Arrival date & time 01/11/14  0710 History   First MD Initiated Contact with Patient 01/11/14 [phone]     Chief Complaint  Patient presents with  . Leg Swelling     (Consider location/radiation/quality/duration/timing/severity/associated sxs/prior Treatment) The history is provided by the patient and a relative.    Pt with hx afib on pradaxa, CHF, renal failure, CAD, p/w left lower extremity swelling and pain.  Swelling began 3 days ago, pain began the next day.  Pain is now 10/10 with walking, improved slightly with ace bandage and "arthritis cream."  Walks with cane at baseline, this is unchanged.  Denies hx DVT.  Denies trauma, falls, recent travel or immobilization, hx cancer, chest pain, SOB, palpitations, back pain, abdominal pain, fevers.  Reports taking all of his medications including pradaxa.    Past Medical History  Diagnosis Date  . Hypertension   . Coronary artery disease   . Diarrhea     secondary to Salmonella species  . Hypokalemia   . History of seizure disorder   . Nausea & vomiting   . Vitiligo   . Acute renal failure   . Deep venous thrombosis     Deep venous thrombosis prophylaxis  . Hiatal hernia 2003  . Atrial fibrillation   . CHF (congestive heart failure)   . Anemia   . Arthritis   . Heart murmur   . Seizures    Past Surgical History  Procedure Laterality Date  . Hernia repair     Family History  Problem Relation Age of Onset  . Stroke Mother   . Hyperlipidemia Mother   . Hypertension Mother   . Heart attack Father   . Kidney Stones Father   . Heart disease Father   . Colon cancer Neg Hx   . Diabetes Other     nephew  . Gout Brother   . Heart disease Brother    History  Substance Use Topics  . Smoking status: Former Games developer  . Smokeless tobacco: Never Used  . Alcohol Use: No    Review of Systems  All other systems reviewed and are negative.     Allergies  Review of patient's allergies indicates no known  allergies.  Home Medications   Prior to Admission medications   Medication Sig Start Date End Date Taking? Authorizing Provider  benazepril (LOTENSIN) 20 MG tablet Take 20 mg by mouth daily.   Yes Historical Provider, MD  cholecalciferol (VITAMIN D) 1000 UNITS tablet Take 2,000 Units by mouth daily.    Yes Historical Provider, MD  dabigatran (PRADAXA) 150 MG CAPS Take 150 mg by mouth every 12 (twelve) hours.   Yes Historical Provider, MD  iron polysaccharides (FERREX 150) 150 MG capsule Take 150 mg by mouth daily.   Yes Historical Provider, MD  metoprolol tartrate (LOPRESSOR) 25 MG tablet Take 25 mg by mouth daily.   Yes Historical Provider, MD  PHENobarbital (LUMINAL) 97.2 MG tablet Take 97.2 mg by mouth at bedtime.    Yes Historical Provider, MD  vitamin B-12 (CYANOCOBALAMIN) 500 MCG tablet Take 500 mcg by mouth daily.    Yes Historical Provider, MD  vitamin E 400 UNIT capsule Take 400 Units by mouth daily.   Yes Historical Provider, MD  Misc. Devices (AIRGO ROLLING WALKER) MISC 1 Units by Does not apply route daily. 05/17/13   Gavin Pound. Ghim, MD   BP 138/86  Pulse 60  Temp(Src) 98.5 F (36.9 C) (Oral)  Resp 18  Ht 6' 0.5" (1.842 m)  Wt 193 lb (87.544 kg)  BMI 25.80 kg/m2  SpO2 97% Physical Exam  Nursing note and vitals reviewed. Constitutional: He appears well-developed and well-nourished. No distress.  HENT:  Head: Normocephalic and atraumatic.  Neck: Neck supple.  Cardiovascular: Normal rate.  An irregular rhythm present.  Pulmonary/Chest: Effort normal and breath sounds normal. No respiratory distress. He has no wheezes. He has no rales.  Abdominal: Soft. He exhibits no distension and no mass. There is no tenderness. There is no rebound and no guarding.  Musculoskeletal:  LLE: Nonpitting edema to the left foot and ankle, lower leg to distal shin.  Nontender to palpation.  No erythema or warmth.  Distal pulses strong.  No calf or thigh tenderness.  Pt does have pain with  straight leg raise, but pain is in the anterior leg.    RLE no edema, nontender, distal pulses intact.    Neurological: He is alert. He exhibits normal muscle tone.  Skin: He is not diaphoretic.    ED Course  Procedures (including critical care time) Labs Review Labs Reviewed  CBC WITH DIFFERENTIAL - Abnormal; Notable for the following:    RBC 3.61 (*)    Hemoglobin 11.0 (*)    HCT 32.2 (*)    All other components within normal limits  COMPREHENSIVE METABOLIC PANEL - Abnormal; Notable for the following:    Albumin 3.2 (*)    GFR calc non Af Amer 60 (*)    GFR calc Af Amer 70 (*)    All other components within normal limits  PRO B NATRIURETIC PEPTIDE - Abnormal; Notable for the following:    Pro B Natriuretic peptide (BNP) 2860.0 (*)    All other components within normal limits    Imaging Review Dg Chest 2 View  01/11/2014   CLINICAL DATA:  Lower extremity swelling.  EXAM: CHEST  2 VIEW  COMPARISON:  PA and lateral chest 09/26/2008.  FINDINGS: There is cardiomegaly without edema. No pneumothorax or pleural effusion.  IMPRESSION: Cardiomegaly without acute disease.   Electronically Signed   By: Drusilla Kanner M.D.   On: 01/11/2014 08:47     EKG Interpretation None      11:06 AM Pt reports pain has improved to 1/10 after vicodin.   11:11 AM Discussed patient with Dr Denton Lank who will also see patient.   11:26 AM Discussed pt with Dr Denton Lank.  Pt does have some mild warmth and some very light erythema around the left ankle.  Will cover for cellulitis.    Filed Vitals:   01/11/14 1130  BP: 138/82  Pulse: 49  Temp:   Resp: 19     MDM   Final diagnoses:  Edema of left lower extremity  Cellulitis of left lower extremity    Afebrile, nontoxic patient with left foot and ankle swelling without fever, trauma.  WBC normal. Mild anemia.  Labs also remarkable for elevated BNP - pt noted to have CHF in the chart and has had CXR findings showing cardiomegaly and early vascular  congestion.  Today he has only cardiomegaly.  He denies CP, SOB.  The left ankle and foot did develop some mild warmth and very mild erythema during visit, will cover for cellulitis with close PCP follow up.  Doubt septic joint, gout.   D/C home with keflex, norco.  PCP follow up in 2-3 days. Discussed result, findings, treatment, and follow up  with patient.  Pt given return precautions.  Pt verbalizes understanding  and agrees with plan.        Trixie Dredge, PA-C 01/11/14 1401

## 2014-01-11 NOTE — Progress Notes (Signed)
VASCULAR LAB PRELIMINARY  PRELIMINARY  PRELIMINARY  PRELIMINARY  Left lower extremity venous Doppler completed.    Preliminary report:  There is no DVT or SVT noted in the left lower extremity.  Atiya Yera, RVT 01/11/2014, 9:02 AM

## 2014-01-12 NOTE — ED Provider Notes (Signed)
Medical screening examination/treatment/procedure(s) were conducted as a shared visit with non-physician practitioner(s) and myself.  I personally evaluated the patient during the encounter.  Pt c/o left ankle pain and swelling. Mild warmth and mild erythema to area. Distal pulses palp. vasc doppler neg for dvt.     Suzi RootsKevin E Clytee Heinrich, MD 01/12/14 [phone]

## 2014-03-17 ENCOUNTER — Inpatient Hospital Stay (HOSPITAL_COMMUNITY): Payer: Medicare HMO

## 2014-03-17 ENCOUNTER — Inpatient Hospital Stay (HOSPITAL_COMMUNITY)
Admission: EM | Admit: 2014-03-17 | Discharge: 2014-03-22 | DRG: 603 | Disposition: A | Discharge status: Skilled Nursing Facility | Payer: Medicare HMO | Attending: Internal Medicine | Admitting: Internal Medicine

## 2014-03-17 ENCOUNTER — Encounter (HOSPITAL_COMMUNITY): Payer: Self-pay | Admitting: Emergency Medicine

## 2014-03-17 DIAGNOSIS — I2510 Atherosclerotic heart disease of native coronary artery without angina pectoris: Secondary | ICD-10-CM | POA: Diagnosis present

## 2014-03-17 DIAGNOSIS — R5381 Other malaise: Secondary | ICD-10-CM | POA: Diagnosis present

## 2014-03-17 DIAGNOSIS — K5900 Constipation, unspecified: Secondary | ICD-10-CM | POA: Diagnosis not present

## 2014-03-17 DIAGNOSIS — Z823 Family history of stroke: Secondary | ICD-10-CM

## 2014-03-17 DIAGNOSIS — I4891 Unspecified atrial fibrillation: Secondary | ICD-10-CM | POA: Diagnosis present

## 2014-03-17 DIAGNOSIS — R001 Bradycardia, unspecified: Secondary | ICD-10-CM | POA: Diagnosis present

## 2014-03-17 DIAGNOSIS — F0390 Unspecified dementia without behavioral disturbance: Secondary | ICD-10-CM | POA: Diagnosis present

## 2014-03-17 DIAGNOSIS — M1990 Unspecified osteoarthritis, unspecified site: Secondary | ICD-10-CM | POA: Diagnosis present

## 2014-03-17 DIAGNOSIS — D638 Anemia in other chronic diseases classified elsewhere: Secondary | ICD-10-CM | POA: Diagnosis present

## 2014-03-17 DIAGNOSIS — Z7901 Long term (current) use of anticoagulants: Secondary | ICD-10-CM | POA: Diagnosis not present

## 2014-03-17 DIAGNOSIS — R319 Hematuria, unspecified: Secondary | ICD-10-CM | POA: Diagnosis present

## 2014-03-17 DIAGNOSIS — G40909 Epilepsy, unspecified, not intractable, without status epilepticus: Secondary | ICD-10-CM | POA: Diagnosis present

## 2014-03-17 DIAGNOSIS — Z791 Long term (current) use of non-steroidal anti-inflammatories (NSAID): Secondary | ICD-10-CM | POA: Diagnosis not present

## 2014-03-17 DIAGNOSIS — Z8249 Family history of ischemic heart disease and other diseases of the circulatory system: Secondary | ICD-10-CM | POA: Diagnosis not present

## 2014-03-17 DIAGNOSIS — R0989 Other specified symptoms and signs involving the circulatory and respiratory systems: Secondary | ICD-10-CM

## 2014-03-17 DIAGNOSIS — Z87891 Personal history of nicotine dependence: Secondary | ICD-10-CM

## 2014-03-17 DIAGNOSIS — I10 Essential (primary) hypertension: Secondary | ICD-10-CM | POA: Diagnosis present

## 2014-03-17 DIAGNOSIS — M7989 Other specified soft tissue disorders: Secondary | ICD-10-CM | POA: Diagnosis not present

## 2014-03-17 DIAGNOSIS — L0390 Cellulitis, unspecified: Secondary | ICD-10-CM | POA: Diagnosis present

## 2014-03-17 DIAGNOSIS — R52 Pain, unspecified: Secondary | ICD-10-CM

## 2014-03-17 DIAGNOSIS — L03116 Cellulitis of left lower limb: Secondary | ICD-10-CM | POA: Diagnosis not present

## 2014-03-17 DIAGNOSIS — I482 Chronic atrial fibrillation: Secondary | ICD-10-CM

## 2014-03-17 DIAGNOSIS — R7989 Other specified abnormal findings of blood chemistry: Secondary | ICD-10-CM | POA: Diagnosis present

## 2014-03-17 DIAGNOSIS — I878 Other specified disorders of veins: Secondary | ICD-10-CM | POA: Diagnosis present

## 2014-03-17 DIAGNOSIS — R270 Ataxia, unspecified: Secondary | ICD-10-CM

## 2014-03-17 LAB — CBC WITH DIFFERENTIAL/PLATELET
Basophils Absolute: 0 10*3/uL (ref 0.0–0.1)
Basophils Relative: 0 % (ref 0–1)
EOS PCT: 0 % (ref 0–5)
Eosinophils Absolute: 0 10*3/uL (ref 0.0–0.7)
HEMATOCRIT: 33.1 % — AB (ref 39.0–52.0)
Hemoglobin: 11.2 g/dL — ABNORMAL LOW (ref 13.0–17.0)
LYMPHS ABS: 0.7 10*3/uL (ref 0.7–4.0)
LYMPHS PCT: 16 % (ref 12–46)
MCH: 30.7 pg (ref 26.0–34.0)
MCHC: 33.8 g/dL (ref 30.0–36.0)
MCV: 90.7 fL (ref 78.0–100.0)
MONO ABS: 0.4 10*3/uL (ref 0.1–1.0)
MONOS PCT: 8 % (ref 3–12)
Neutro Abs: 3.2 10*3/uL (ref 1.7–7.7)
Neutrophils Relative %: 76 % (ref 43–77)
Platelets: 126 10*3/uL — ABNORMAL LOW (ref 150–400)
RBC: 3.65 MIL/uL — AB (ref 4.22–5.81)
RDW: 14.1 % (ref 11.5–15.5)
WBC: 4.3 10*3/uL (ref 4.0–10.5)

## 2014-03-17 LAB — URINALYSIS, ROUTINE W REFLEX MICROSCOPIC
BILIRUBIN URINE: NEGATIVE
GLUCOSE, UA: NEGATIVE mg/dL
Ketones, ur: NEGATIVE mg/dL
Leukocytes, UA: NEGATIVE
Nitrite: NEGATIVE
PH: 7 (ref 5.0–8.0)
Protein, ur: NEGATIVE mg/dL
Specific Gravity, Urine: 1.009 (ref 1.005–1.030)
Urobilinogen, UA: 1 mg/dL (ref 0.0–1.0)

## 2014-03-17 LAB — BASIC METABOLIC PANEL
Anion gap: 11 (ref 5–15)
BUN: 16 mg/dL (ref 6–23)
CALCIUM: 9 mg/dL (ref 8.4–10.5)
CO2: 24 meq/L (ref 19–32)
Chloride: 104 mEq/L (ref 96–112)
Creatinine, Ser: 1.24 mg/dL (ref 0.50–1.35)
GFR calc Af Amer: 59 mL/min — ABNORMAL LOW (ref >90)
GFR calc non Af Amer: 51 mL/min — ABNORMAL LOW (ref >90)
Glucose, Bld: 96 mg/dL (ref 70–99)
Potassium: 4.2 mEq/L (ref 3.7–5.3)
SODIUM: 139 meq/L (ref 137–147)

## 2014-03-17 LAB — URINE MICROSCOPIC-ADD ON

## 2014-03-17 MED ORDER — DEXTROSE 5 % IV SOLN
1.0000 g | Freq: Once | INTRAVENOUS | Status: AC
  Administered 2014-03-17: 1 g via INTRAVENOUS
  Filled 2014-03-17: qty 10

## 2014-03-17 MED ORDER — ACETAMINOPHEN 325 MG PO TABS: 650.0000 mg | ORAL_TABLET | Freq: Four times a day (QID) | ORAL | Status: DC | PRN

## 2014-03-17 MED ORDER — BENAZEPRIL HCL 20 MG PO TABS
20.0000 mg | ORAL_TABLET | Freq: Every day | ORAL | Status: DC
  Administered 2014-03-18 – 2014-03-22 (×5): 20 mg via ORAL
  Filled 2014-03-17 (×5): qty 1

## 2014-03-17 MED ORDER — DABIGATRAN ETEXILATE MESYLATE 150 MG PO CAPS
150.0000 mg | ORAL_CAPSULE | Freq: Two times a day (BID) | ORAL | Status: DC
  Administered 2014-03-17 – 2014-03-22 (×10): 150 mg via ORAL
  Filled 2014-03-17 (×11): qty 1

## 2014-03-17 MED ORDER — METOPROLOL TARTRATE 12.5 MG HALF TABLET
12.5000 mg | ORAL_TABLET | Freq: Every day | ORAL | Status: DC
  Administered 2014-03-18 – 2014-03-22 (×5): 12.5 mg via ORAL
  Filled 2014-03-17 (×5): qty 1

## 2014-03-17 MED ORDER — ONDANSETRON HCL 4 MG PO TABS: 4.0000 mg | ORAL_TABLET | Freq: Four times a day (QID) | ORAL | Status: DC | PRN

## 2014-03-17 MED ORDER — HYDROCODONE-ACETAMINOPHEN 5-325 MG PO TABS: 1.0000 | ORAL_TABLET | Freq: Four times a day (QID) | ORAL | Status: DC | PRN

## 2014-03-17 MED ORDER — ONDANSETRON HCL 4 MG/2ML IJ SOLN: 4.0000 mg | Freq: Four times a day (QID) | INTRAMUSCULAR | Status: DC | PRN

## 2014-03-17 MED ORDER — ACETAMINOPHEN 650 MG RE SUPP: 650.0000 mg | Freq: Four times a day (QID) | RECTAL | Status: DC | PRN

## 2014-03-17 MED ORDER — DOXYCYCLINE HYCLATE 100 MG PO TABS
100.0000 mg | ORAL_TABLET | Freq: Two times a day (BID) | ORAL | Status: DC
  Administered 2014-03-17 – 2014-03-20 (×6): 100 mg via ORAL
  Filled 2014-03-17 (×7): qty 1

## 2014-03-17 MED ORDER — PHENOBARBITAL 32.4 MG PO TABS
97.2000 mg | ORAL_TABLET | Freq: Every day | ORAL | Status: DC
  Administered 2014-03-17 – 2014-03-21 (×5): 97.2 mg via ORAL
  Filled 2014-03-17 (×5): qty 3

## 2014-03-17 MED ORDER — HYDROCODONE-ACETAMINOPHEN 5-325 MG PO TABS: 1.0000 | ORAL_TABLET | ORAL | Status: DC | PRN

## 2014-03-17 MED ORDER — SODIUM CHLORIDE 0.9 % IJ SOLN
3.0000 mL | Freq: Two times a day (BID) | INTRAMUSCULAR | Status: DC
  Administered 2014-03-17 – 2014-03-22 (×7): 3 mL via INTRAVENOUS

## 2014-03-17 NOTE — ED Provider Notes (Signed)
CSN: [id number]     Arrival date & time 03/17/14  1606 History   First MD Initiated Contact with Patient 03/17/14 1608     Chief Complaint  Patient presents with  . Leg Swelling      HPI  Patient primary care physician is at the [facility].  Patient presents for evaluation of left leg pain, redness, and swelling. He was at his senior center today. They became concerned because he was unable to walk. He complained of the pain in the leg. They noticed a red area. He was transferred via EMS from the senior center to here. He lives independently.  He denies injury. He denies fever shakes or chills. No right leg symptoms. He has had swelling in the past he takes Lasix for.  Past Medical History  Diagnosis Date  . Hypertension   . Coronary artery disease   . Diarrhea     secondary to Salmonella species  . Hypokalemia   . History of seizure disorder   . Nausea & vomiting   . Vitiligo   . Acute renal failure   . Deep venous thrombosis     Deep venous thrombosis prophylaxis  . Hiatal hernia 2003  . Atrial fibrillation   . CHF (congestive heart failure)   . Anemia   . Arthritis   . Heart murmur   . Seizures    Past Surgical History  Procedure Laterality Date  . Hernia repair     Family History  Problem Relation Age of Onset  . Stroke Mother   . Hyperlipidemia Mother   . Hypertension Mother   . Heart attack Father   . Kidney Stones Father   . Heart disease Father   . Colon cancer Neg Hx   . Diabetes Other     nephew  . Gout Brother   . Heart disease Brother    History  Substance Use Topics  . Smoking status: Former Games developermoker  . Smokeless tobacco: Never Used  . Alcohol Use: No    Review of Systems  Constitutional: Negative for fever, chills, diaphoresis, appetite change and fatigue.  HENT: Negative for mouth sores, sore throat and trouble swallowing.   Eyes: Negative for visual disturbance.  Respiratory: Negative for cough, chest tightness, shortness of breath  and wheezing.   Cardiovascular: Negative for chest pain.  Gastrointestinal: Negative for nausea, vomiting, abdominal pain, diarrhea and abdominal distention.  Endocrine: Negative for polydipsia, polyphagia and polyuria.  Genitourinary: Negative for dysuria, frequency and hematuria.  Musculoskeletal: Negative for gait problem.  Skin: Negative for color change, pallor and rash.       Redness and pain to the left medial foot.  Neurological: Negative for dizziness, syncope, light-headedness and headaches.  Hematological: Does not bruise/bleed easily.  Psychiatric/Behavioral: Negative for behavioral problems and confusion.      Allergies  Review of patient's allergies indicates no known allergies.  Home Medications   Prior to Admission medications   Medication Sig Start Date End Date Taking? Authorizing Provider  benazepril (LOTENSIN) 20 MG tablet Take 20 mg by mouth daily.   Yes Historical Provider, MD  cholecalciferol (VITAMIN D) 1000 UNITS tablet Take 2,000 Units by mouth daily.    Yes Historical Provider, MD  dabigatran (PRADAXA) 150 MG CAPS Take 150 mg by mouth every 12 (twelve) hours.   Yes Historical Provider, MD  HYDROcodone-acetaminophen (NORCO/VICODIN) 5-325 MG per tablet Take 1 tablet by mouth every 6 (six) hours as needed for moderate pain or severe pain. 01/11/14  Yes Trixie Dredge, PA-C  iron polysaccharides (FERREX 150) 150 MG capsule Take 150 mg by mouth daily.   Yes Historical Provider, MD  metoprolol tartrate (LOPRESSOR) 25 MG tablet Take 25 mg by mouth daily.   Yes Historical Provider, MD  PHENobarbital (LUMINAL) 97.2 MG tablet Take 97.2 mg by mouth at bedtime.    Yes Historical Provider, MD  vitamin B-12 (CYANOCOBALAMIN) 500 MCG tablet Take 500 mcg by mouth daily.    Yes Historical Provider, MD  vitamin E 400 UNIT capsule Take 400 Units by mouth daily.   Yes Historical Provider, MD  Misc. Devices (AIRGO ROLLING WALKER) MISC 1 Units by Does not apply route daily. 05/17/13    Gavin Pound. Ghim, MD   BP 128/95 mmHg  Pulse 64  Temp(Src) 98.4 F (36.9 C) (Oral)  Resp 16  Ht 6" (0.152 m)  Wt 185 lb 6.5 oz (84.1 kg)  BMI 3640.06 kg/m2  SpO2 99% Physical Exam  Constitutional: He is oriented to person, place, and time. He appears well-developed and well-nourished. No distress.  HENT:  Head: Normocephalic.  Eyes: Conjunctivae are normal. Pupils are equal, round, and reactive to light. No scleral icterus.  Neck: Normal range of motion. Neck supple. No thyromegaly present.  Cardiovascular: Normal rate and regular rhythm.  Exam reveals no gallop and no friction rub.   No murmur heard. Pulmonary/Chest: Effort normal and breath sounds normal. No respiratory distress. He has no wheezes. He has no rales.  Abdominal: Soft. Bowel sounds are normal. He exhibits no distension. There is no tenderness. There is no rebound.  Musculoskeletal: Normal range of motion.       Feet:  Neurological: He is alert and oriented to person, place, and time.  Skin: Skin is warm and dry. No rash noted.  Psychiatric: He has a normal mood and affect. His behavior is normal.    ED Course  Procedures (including critical care time) Labs Review Labs Reviewed  BASIC METABOLIC PANEL - Abnormal; Notable for the following:    GFR calc non Af Amer 51 (*)    GFR calc Af Amer 59 (*)    All other components within normal limits  CBC WITH DIFFERENTIAL - Abnormal; Notable for the following:    RBC 3.65 (*)    Hemoglobin 11.2 (*)    HCT 33.1 (*)    Platelets 126 (*)    All other components within normal limits  URINALYSIS, ROUTINE W REFLEX MICROSCOPIC - Abnormal; Notable for the following:    Hgb urine dipstick LARGE (*)    All other components within normal limits  TSH - Abnormal; Notable for the following:    TSH 6.320 (*)    All other components within normal limits  COMPREHENSIVE METABOLIC PANEL - Abnormal; Notable for the following:    GFR calc non Af Amer 63 (*)    GFR calc Af Amer 73  (*)    All other components within normal limits  CBC - Abnormal; Notable for the following:    RBC 4.01 (*)    Hemoglobin 11.9 (*)    HCT 35.5 (*)    Platelets 132 (*)    All other components within normal limits  RETICULOCYTES - Abnormal; Notable for the following:    RBC. 4.01 (*)    All other components within normal limits  URINE CULTURE  URINE MICROSCOPIC-ADD ON  MAGNESIUM  PHOSPHORUS  HEMOGLOBIN A1C  VITAMIN B12  FOLATE  IRON AND TIBC  FERRITIN    Imaging Review  X-ray Chest Pa And Lateral  03/18/2014   CLINICAL DATA:  Acute chills, cellulitis and bradycardia. Initial encounter.  EXAM: CHEST  2 VIEW  COMPARISON:  01/11/2014 and 09/26/2008 radiographs  FINDINGS: Cardiomegaly and fullness of the superior mediastinum are unchanged.  There is no evidence of focal airspace disease, pulmonary edema, suspicious pulmonary nodule/mass, pleural effusion, or pneumothorax. No acute bony abnormalities are identified.  IMPRESSION: Cardiomegaly without evidence of acute cardiopulmonary disease.   Electronically Signed   By: Laveda AbbeJeff  Hu M.D.   On: 03/18/2014 03:08   [place] Renal  03/18/2014   CLINICAL DATA:  Bibasilar crackles, hematuria, hypertension, acute renal failure  EXAM: RENAL/URINARY TRACT ULTRASOUND COMPLETE  COMPARISON:  Abdomen ultrasound 09/29/2008  FINDINGS: Right Kidney:  Length: 11.5 cm. Normal cortical thickness. Increased cortical echogenicity. No mass, hydronephrosis or shadowing calcification.  Left Kidney:  Length: 11.9 cm. Normal cortical thickness. Increased cortical echogenicity. Multiple cysts, largest at upper pole 2.6 x 2.5 x 3.7 cm. No solid mass, hydronephrosis or shadowing calcification.  Bladder:  Partially distended.  Bladder wall appears thickened.  Prostate gland:  Markedly enlarged 8.4 x 8.2 x 9.1 cm.  Incidentally noted cholelithiasis, mobile calcified gallstone 2.6 cm diameter noted.  IMPRESSION: Medical renal disease changes of both kidneys without hydronephrosis.   LEFT renal cysts.  Markedly enlarged prostate gland with note of mild bladder wall thickening which could indicate muscular hypertrophy and chronic outlet obstruction.  Cholelithiasis, noted previously.   Electronically Signed   By: Ulyses SouthwardMark  Boles M.D.   On: 03/18/2014 11:10     EKG Interpretation None      MDM   Final diagnoses:  Cellulitis of left lower extremity    Doppler shows no clot. Initially in speaking with the patient he had felt that he would be able to go home. His sister arrives. She is able to give me more details about him from the senior center i.e. that he was not able to walk. He is unable to stand and bear weight on the leg due to pain here. I have requested admission.    Rolland PorterMark Lisseth Brazeau, MD 03/18/14 [phone]

## 2014-03-17 NOTE — ED Notes (Signed)
I gave the patient a happy meal and a cup of ice water. 

## 2014-03-17 NOTE — Progress Notes (Signed)
Left lower extremity venous duplex completed.  Left:  No evidence of DVT or superficial thrombosis.  There appears to be a Baker's cyst in the left popliteal fossa.  Right:  Negative for DVT in the common femoral vein.  

## 2014-03-17 NOTE — H&P (Addendum)
PCP:  Diamantina ProvidenceANDERSON,TAKELA N., FNP    Chief Complaint:  Left leg swelling  HPI: Donald Reynolds is a 80 y.o. male   has a past medical history of Hypertension; Coronary artery disease; Diarrhea; Hypokalemia; History of seizure disorder; Nausea & vomiting; Vitiligo; Acute renal failure; Deep venous thrombosis; Hiatal hernia (2003); Atrial fibrillation; CHF (congestive heart failure); Anemia; Arthritis; Heart murmur; and Seizures.   Presented with  1 day hx of left leg swelling,redness and some pain. No fever but some chills. No chest pain no fever but some chills.  Patient had a venous doppler that was negative. Patient goes to senior center and was noted not be able to walk well. He was given 4 prong cane but he was having trouble ambulating. He lives with his elderly sister. Patient was noted to be fatigued and started to be incontinent of urine. patient has hx of dementia at baseline oriented to self but not time or place. Per sister he has had difficulty with ambulating that is progressing over past 24 hours.   Hospitalist was called for admission for cellulitis.   Review of Systems:    Pertinent positives include: chills, left leg pain  Constitutional:  No weight loss, night sweats, Fevers, fatigue, weight loss  HEENT:  No headaches, Difficulty swallowing,Tooth/dental problems,Sore throat,  No sneezing, itching, ear ache, nasal congestion, post nasal drip,  Cardio-vascular:  No chest pain, Orthopnea, PND, anasarca, dizziness, palpitations.no Bilateral lower extremity swelling  GI:  No heartburn, indigestion, abdominal pain, nausea, vomiting, diarrhea, change in bowel habits, loss of appetite, melena, blood in stool, hematemesis Resp:  no shortness of breath at rest. No dyspnea on exertion, No excess mucus, no productive cough, No non-productive cough, No coughing up of blood.No change in color of mucus.No wheezing. Skin:  no rash or lesions. No jaundice GU:  no dysuria, change in  color of urine, no urgency or frequency. No straining to urinate.  No flank pain.  Musculoskeletal:  No joint pain or no joint swelling. No decreased range of motion. No back pain.  Psych:  No change in mood or affect. No depression or anxiety. No memory loss.  Neuro: no localizing neurological complaints, no tingling, no weakness, no double vision, no gait abnormality, no slurred speech, no confusion  Otherwise ROS are negative except for above, 10 systems were reviewed  Past Medical History: Past Medical History  Diagnosis Date  . Hypertension   . Coronary artery disease   . Diarrhea     secondary to Salmonella species  . Hypokalemia   . History of seizure disorder   . Nausea & vomiting   . Vitiligo   . Acute renal failure   . Deep venous thrombosis     Deep venous thrombosis prophylaxis  . Hiatal hernia 2003  . Atrial fibrillation   . CHF (congestive heart failure)   . Anemia   . Arthritis   . Heart murmur   . Seizures    Past Surgical History  Procedure Laterality Date  . Hernia repair       Medications: Prior to Admission medications   Medication Sig Start Date End Date Taking? Authorizing Provider  benazepril (LOTENSIN) 20 MG tablet Take 20 mg by mouth daily.   Yes Historical Provider, MD  cholecalciferol (VITAMIN D) 1000 UNITS tablet Take 2,000 Units by mouth daily.    Yes Historical Provider, MD  dabigatran (PRADAXA) 150 MG CAPS Take 150 mg by mouth every 12 (twelve) hours.   Yes Historical Provider,  MD  HYDROcodone-acetaminophen (NORCO/VICODIN) 5-325 MG per tablet Take 1 tablet by mouth every 6 (six) hours as needed for moderate pain or severe pain. 01/11/14  Yes Trixie DredgeEmily West, PA-C  iron polysaccharides (FERREX 150) 150 MG capsule Take 150 mg by mouth daily.   Yes Historical Provider, MD  metoprolol tartrate (LOPRESSOR) 25 MG tablet Take 25 mg by mouth daily.   Yes Historical Provider, MD  PHENobarbital (LUMINAL) 97.2 MG tablet Take 97.2 mg by mouth at bedtime.     Yes Historical Provider, MD  vitamin B-12 (CYANOCOBALAMIN) 500 MCG tablet Take 500 mcg by mouth daily.    Yes Historical Provider, MD  vitamin E 400 UNIT capsule Take 400 Units by mouth daily.   Yes Historical Provider, MD  Misc. Devices (AIRGO ROLLING WALKER) MISC 1 Units by Does not apply route daily. 05/17/13   Gavin PoundMichael Y. Ghim, MD    Allergies:  No Known Allergies  Social History:  Ambulatory   cane,   Lives at home  With family    reports that he has quit smoking. He has never used smokeless tobacco. He reports that he does not drink alcohol or use illicit drugs.    Family History: family history includes Diabetes in his other; Gout in his brother; Heart attack in his father; Heart disease in his brother and father; Hyperlipidemia in his mother; Hypertension in his mother; Kidney Stones in his father; Stroke in his mother. There is no history of Colon cancer.    Physical Exam: Patient Vitals for the past 24 hrs:  BP Temp Temp src Pulse Resp SpO2 Height Weight  03/17/14 1930 121/80 mmHg - - (!) 54 24 97 % - -  03/17/14 1926 131/90 mmHg - - 65 20 98 % - -  03/17/14 1900 131/90 mmHg - - - 17 - - -  03/17/14 1622 146/78 mmHg 98.6 F (37 C) Oral 68 24 100 % - -  03/17/14 1617 - - - - - - 6\' 6"  (1.981 m) 95.255 kg (210 lb)    1. General:  in No Acute distress 2. Psychological: Alert and Oriented to place but not time or situation 3. Head/ENT:    Dry Mucous Membranes                          Head Non traumatic, neck supple                           Poor Dentition 4. SKIN: normal   Skin turgor,  Skin clean Dry and intact no rash 5. Heart: Regular rate and rhythm no Murmur, Rub or gallop 6. Lungs:   no wheezes occasional crackles   7. Abdomen: Soft, non-tender, Non distended 8. Lower extremities: no clubbing, cyanosis, or edema 9. Neurologically Grossly intact, moving all 4 extremities equally 10. MSK: Normal range of motion  body mass index is 24.27 kg/(m^2).   Labs on  Admission:   Results for orders placed or performed during the hospital encounter of 03/17/14 (from the past 24 hour(s))  Basic metabolic panel     Status: Abnormal   Collection Time: 03/17/14  4:35 PM  Result Value Ref Range   Sodium 139 137 - 147 mEq/L   Potassium 4.2 3.7 - 5.3 mEq/L   Chloride 104 96 - 112 mEq/L   CO2 24 19 - 32 mEq/L   Glucose, Bld 96 70 - 99 mg/dL   BUN  16 6 - 23 mg/dL   Creatinine, Ser 1.61 0.50 - 1.35 mg/dL   Calcium 9.0 8.4 - 09.6 mg/dL   GFR calc non Af Amer 51 (L) >90 mL/min   GFR calc Af Amer 59 (L) >90 mL/min   Anion gap 11 5 - 15  CBC with Differential     Status: Abnormal   Collection Time: 03/17/14  4:35 PM  Result Value Ref Range   WBC 4.3 4.0 - 10.5 K/uL   RBC 3.65 (L) 4.22 - 5.81 MIL/uL   Hemoglobin 11.2 (L) 13.0 - 17.0 g/dL   HCT 04.5 (L) 40.9 - 81.1 %   MCV 90.7 78.0 - 100.0 fL   MCH 30.7 26.0 - 34.0 pg   MCHC 33.8 30.0 - 36.0 g/dL   RDW 91.4 78.2 - 95.6 %   Platelets 126 (L) 150 - 400 K/uL   Neutrophils Relative % 76 43 - 77 %   Neutro Abs 3.2 1.7 - 7.7 K/uL   Lymphocytes Relative 16 12 - 46 %   Lymphs Abs 0.7 0.7 - 4.0 K/uL   Monocytes Relative 8 3 - 12 %   Monocytes Absolute 0.4 0.1 - 1.0 K/uL   Eosinophils Relative 0 0 - 5 %   Eosinophils Absolute 0.0 0.0 - 0.7 K/uL   Basophils Relative 0 0 - 1 %   Basophils Absolute 0.0 0.0 - 0.1 K/uL  Urinalysis, Routine w reflex microscopic     Status: Abnormal   Collection Time: 03/17/14  7:46 PM  Result Value Ref Range   Color, Urine YELLOW YELLOW   APPearance CLEAR CLEAR   Specific Gravity, Urine 1.009 1.005 - 1.030   pH 7.0 5.0 - 8.0   Glucose, UA NEGATIVE NEGATIVE mg/dL   Hgb urine dipstick LARGE (A) NEGATIVE   Bilirubin Urine NEGATIVE NEGATIVE   Ketones, ur NEGATIVE NEGATIVE mg/dL   Protein, ur NEGATIVE NEGATIVE mg/dL   Urobilinogen, UA 1.0 0.0 - 1.0 mg/dL   Nitrite NEGATIVE NEGATIVE   Leukocytes, UA NEGATIVE NEGATIVE  Urine microscopic-add on     Status: None   Collection Time:  03/17/14  7:46 PM  Result Value Ref Range   Squamous Epithelial / LPF RARE RARE   WBC, UA 0-2 <3 WBC/hpf   RBC / HPF 21-50 <3 RBC/hpf   Bacteria, UA RARE RARE    UA Hematuria  No results found for: HGBA1C  Estimated Creatinine Clearance: 55.3 mL/min (by C-G formula based on Cr of 1.24).  BNP (last 3 results)  Recent Labs  01/11/14 0947  PROBNP 2860.0*     Filed Weights   03/17/14 1617  Weight: 95.255 kg (210 lb)     Cultures:    Component Value Date/Time   SDES STOOL 09/30/2008 1020   SPECREQUEST IMMUNE:COMPRM 09/30/2008 1020   CULT  09/27/2008 0219    SALMONELLA SPECIES Note: SALMONELLA JAVIANA RESULTS BY [facility] IN [place], [place] CRITICAL RESULT CALLED TO, READ BACK BY AND VERIFIED WITH: [facility] TRIPLIN RN @ 1042AM BY VINCJ 09/30/08 FAXED TO DORETTA HOPPER LYONK 09/30/08   REPTSTATUS 09/30/2008 FINAL 09/30/2008 1020     Radiological Exams on Admission: No results found.  Chart has been reviewed  Assessment/Plan  80 year old gentleman with history of dementia, atrial fibrillation on chronic Pradaxa presents with mild left leg cellulitis and debility in ER was noted to be bradycardic.   Present on Admission:  . Atrial fibrillation - continue Pradaxa monitor on telemetry given her bradycardia. We will decrease slightly  dose of metoprolol down to 12.5 it is not clear why patient's only taken once a day  . Essential hypertension - decreasing dose of metoprolol  . Cellulitis - mild will cover with doxycycline Dopplers negative  . Debility - fatigue likely multifactorial. Patient with active cellulitis. Slightly anemic. Somewhat bradycardic will have PT OT evaluation monitor on telemetry for bradycardia Given bibasal crackles will obtain chest x-ray as part of the workup also for generalized fatigue and debility and elderly.  . Hematuria - mild patient on pradaxa, obtain renal ultrasound, hematuria mild for now continue pradaxa . Bradycardia - decreased  dose of metoprolol observe on telemetry Anemia mild will obtain Hemoccult stool and anemia panel  Prophylaxis:pradaxa, Protonix  CODE STATUS:  FULL CODE  Other plan as per orders.  I have spent a total of 55 min on this admission  Niya Behler 03/17/2014, 8:37 PM  [facility]  Pager [phone]   after 2 AM please page floor coverage PA If 7AM-7PM, please contact the day team taking care of the patient  [email or web address]  Password [email or web address]

## 2014-03-17 NOTE — ED Notes (Addendum)
Per EMS: Left leg swelling since this am, history of same, has been treating lasix in past for this per EMS. Per history here, patient does have hx of DVT. Does have hx of chf . Redness to medial part of ankle/foot.   Patient denies pain currently but states he is unable to put weight on left leg due to pain from swelling.

## 2014-03-17 NOTE — ED Notes (Signed)
The patient is unable to give an urine specimen at this time. The tech has reported to the RN in charge. 

## 2014-03-18 ENCOUNTER — Encounter (HOSPITAL_COMMUNITY): Payer: Self-pay | Admitting: General Practice

## 2014-03-18 ENCOUNTER — Inpatient Hospital Stay (HOSPITAL_COMMUNITY): Payer: Medicare HMO

## 2014-03-18 DIAGNOSIS — R5381 Other malaise: Secondary | ICD-10-CM

## 2014-03-18 DIAGNOSIS — F0390 Unspecified dementia without behavioral disturbance: Secondary | ICD-10-CM

## 2014-03-18 LAB — CBC
HEMATOCRIT: 35.5 % — AB (ref 39.0–52.0)
HEMOGLOBIN: 11.9 g/dL — AB (ref 13.0–17.0)
MCH: 29.7 pg (ref 26.0–34.0)
MCHC: 33.5 g/dL (ref 30.0–36.0)
MCV: 88.5 fL (ref 78.0–100.0)
Platelets: 132 10*3/uL — ABNORMAL LOW (ref 150–400)
RBC: 4.01 MIL/uL — ABNORMAL LOW (ref 4.22–5.81)
RDW: 14.1 % (ref 11.5–15.5)
WBC: 5 10*3/uL (ref 4.0–10.5)

## 2014-03-18 LAB — PHOSPHORUS: Phosphorus: 3.2 mg/dL (ref 2.3–4.6)

## 2014-03-18 LAB — RETICULOCYTES
RBC.: 4.01 MIL/uL — ABNORMAL LOW (ref 4.22–5.81)
Retic Count, Absolute: 20.1 10*3/uL (ref 19.0–186.0)
Retic Ct Pct: 0.5 % (ref 0.4–3.1)

## 2014-03-18 LAB — FERRITIN: Ferritin: 109 ng/mL (ref 22–322)

## 2014-03-18 LAB — COMPREHENSIVE METABOLIC PANEL
ALBUMIN: 3.5 g/dL (ref 3.5–5.2)
ALT: 7 U/L (ref 0–53)
ANION GAP: 12 (ref 5–15)
AST: 12 U/L (ref 0–37)
Alkaline Phosphatase: 87 U/L (ref 39–117)
BUN: 15 mg/dL (ref 6–23)
CHLORIDE: 104 meq/L (ref 96–112)
CO2: 25 mEq/L (ref 19–32)
Calcium: 9.4 mg/dL (ref 8.4–10.5)
Creatinine, Ser: 1.04 mg/dL (ref 0.50–1.35)
GFR calc Af Amer: 73 mL/min — ABNORMAL LOW (ref >90)
GFR calc non Af Amer: 63 mL/min — ABNORMAL LOW (ref >90)
Glucose, Bld: 81 mg/dL (ref 70–99)
Potassium: 4.1 mEq/L (ref 3.7–5.3)
SODIUM: 141 meq/L (ref 137–147)
TOTAL PROTEIN: 6.5 g/dL (ref 6.0–8.3)
Total Bilirubin: 0.5 mg/dL (ref 0.3–1.2)

## 2014-03-18 LAB — FOLATE: Folate: 12.8 ng/mL

## 2014-03-18 LAB — URINE CULTURE
CULTURE: NO GROWTH
Colony Count: NO GROWTH

## 2014-03-18 LAB — MAGNESIUM: MAGNESIUM: 1.9 mg/dL (ref 1.5–2.5)

## 2014-03-18 LAB — HEMOGLOBIN A1C
Hgb A1c MFr Bld: 5.9 % — ABNORMAL HIGH (ref <5.7)
MEAN PLASMA GLUCOSE: 123 mg/dL — AB (ref <117)

## 2014-03-18 LAB — VITAMIN B12: VITAMIN B 12: 222 pg/mL (ref 211–911)

## 2014-03-18 LAB — TSH: TSH: 6.32 u[IU]/mL — ABNORMAL HIGH (ref 0.350–4.500)

## 2014-03-18 NOTE — Plan of Care (Signed)
Problem: Phase I Progression Outcomes Goal: Hemodynamically stable Outcome: Completed/Met Date Met:  03/18/14

## 2014-03-18 NOTE — Plan of Care (Signed)
Problem: Phase III Progression Outcomes Goal: Activity at appropriate level-compared to baseline (UP IN CHAIR FOR HEMODIALYSIS)  Outcome: Progressing     

## 2014-03-18 NOTE — Progress Notes (Signed)
PROGRESS NOTE  Donald RoseWilliam Reynolds ZOX:[id number] DOB: [birth year] DOA: 03/17/2014 PCP: Diamantina ProvidenceANDERSON,TAKELA N., FNP  Assessment/Plan: 80 year old gentleman with history of dementia, atrial fibrillation on chronic Pradaxa presents with mild left leg cellulitis and debility in ER was noted to be bradycardic.   Atrial fibrillation -  - Pradaxa monitor  -telemetry  -decrease dose of metoprolol down to 12.5 as patient is bradycardic  Essential hypertension - decreasing dose of metoprolol   Cellulitis - mild will cover with doxycycline -Dopplers negative   Debility - fatigue likely multifactorial. Patient with active cellulitis. Slightly anemic. Somewhat bradycardic  -PT/OT evaluation monitor on telemetry for bradycardia -chest x ray ok  Hematuria - mild patient on pradaxa, obtain renal ultrasound, hematuria mild for now continue pradaxa  Bradycardia - decreased dose of metoprolol observe on telemetry  Anemia mild will obtain Hemoccult stool and anemia panel  Code Status: full Family Communication: called Daphine DeutscherAnne Bonifas, NA [phone] Disposition Plan: PT/OT eval   Consultants:    Procedures:  Renal u/s    HPI/Subjective: No SOB, no CP, going down for renal u/s  Objective: Filed Vitals:   03/18/14 0425  BP: 119/75  Pulse: 64  Temp: 97.9 F (36.6 C)  Resp: 16    Intake/Output Summary (Last 24 hours) at 03/18/14 0929 Last data filed at 03/17/14 1948  Gross per 24 hour  Intake      0 ml  Output    250 ml  Net   -250 ml   Filed Weights   03/17/14 1617 03/18/14 0040  Weight: 95.255 kg (210 lb) 84.1 kg (185 lb 6.5 oz)    Exam:   General:  Pleasant/cooperative  Cardiovascular: rrr  Respiratory: clear anterior, no wheezing  Abdomen: +Bs, soft  Skin- chronic changes - vitilgo  Data Reviewed: Basic Metabolic Panel:  Recent Labs Lab 03/17/14 1635 03/18/14 0539  NA 139 141  K 4.2 4.1  CL 104 104  CO2 24 25  GLUCOSE 96 81  BUN 16 15  CREATININE 1.24 1.04    CALCIUM 9.0 9.4  MG  --  1.9  PHOS  --  3.2   Liver Function Tests:  Recent Labs Lab 03/18/14 0539  AST 12  ALT 7  ALKPHOS 87  BILITOT 0.5  PROT 6.5  ALBUMIN 3.5   No results for input(s): LIPASE, AMYLASE in the last 168 hours. No results for input(s): AMMONIA in the last 168 hours. CBC:  Recent Labs Lab 03/17/14 1635 03/18/14 0539  WBC 4.3 5.0  NEUTROABS 3.2  --   HGB 11.2* 11.9*  HCT 33.1* 35.5*  MCV 90.7 88.5  PLT 126* 132*   Cardiac Enzymes: No results for input(s): CKTOTAL, CKMB, CKMBINDEX, TROPONINI in the last 168 hours. BNP (last 3 results)  Recent Labs  01/11/14 0947  PROBNP 2860.0*   CBG: No results for input(s): GLUCAP in the last 168 hours.  No results found for this or any previous visit (from the past 240 hour(s)).   Studies: X-ray Chest Pa And Lateral  03/18/2014   CLINICAL DATA:  Acute chills, cellulitis and bradycardia. Initial encounter.  EXAM: CHEST  2 VIEW  COMPARISON:  01/11/2014 and 09/26/2008 radiographs  FINDINGS: Cardiomegaly and fullness of the superior mediastinum are unchanged.  There is no evidence of focal airspace disease, pulmonary edema, suspicious pulmonary nodule/mass, pleural effusion, or pneumothorax. No acute bony abnormalities are identified.  IMPRESSION: Cardiomegaly without evidence of acute cardiopulmonary disease.   Electronically Signed   By: Laveda AbbeJeff  Hu M.D.   On:  03/18/2014 03:08    Scheduled Meds: . benazepril  20 mg Oral Daily  . dabigatran  150 mg Oral Q12H  . doxycycline  100 mg Oral Q12H  . metoprolol tartrate  12.5 mg Oral Daily  . PHENobarbital  97.2 mg Oral QHS  . sodium chloride  3 mL Intravenous Q12H   Continuous Infusions:  Antibiotics Given (last 72 hours)    Date/Time Action Medication Dose   03/17/14 2228 Given   doxycycline (VIBRA-TABS) tablet 100 mg 100 mg      Active Problems:   Essential hypertension   Atrial fibrillation   Convulsions   Cellulitis   Dementia   Debility    Hematuria   Bradycardia    Time spent: 35 min    Sarissa Dern  [facility] Pager [phone]. If 7PM-7AM, please contact night-coverage at [email or web address], password [email or web address] 03/18/2014, 9:29 AM  LOS: 1 day

## 2014-03-18 NOTE — Progress Notes (Signed)
Spoke with sister at bedside.  She is no longer able to care for him at home.  Await PT eval but she is inquiring about ALF/SNF Will consult social worker Marlin CanaryJessica Minh Jasper

## 2014-03-18 NOTE — Plan of Care (Signed)
Problem: Phase II Progression Outcomes Goal: Progress activity as tolerated unless otherwise ordered Outcome: Progressing     

## 2014-03-18 NOTE — Plan of Care (Signed)
Problem: Phase II Progression Outcomes Goal: IV changed to normal saline lock Outcome: Completed/Met Date Met:  03/18/14

## 2014-03-18 NOTE — Evaluation (Signed)
Physical Therapy Evaluation Patient Details Name: Donald Reynolds MRN: [id number] DOB: [birth year] Today's Date: 03/18/2014   History of Present Illness  Mr. Donald Reynolds presents w/ past medical history of Hypertension; Coronary artery disease; Diarrhea; Hypokalemia; History of seizure disorder; Nausea & vomiting; Vitiligo; Acute renal failure; Deep venous thrombosis; Hiatal hernia (2003); Atrial fibrillation; CHF (congestive heart failure); Anemia; Arthritis; Heart murmur; and Seizures.    Clinical Impression  Pt demonstrates decreased balance and mobility due to increased pain in L ankle.  Note cellulitis present in L ankle.  Currently requires max A for sit<>stand, stand pivot transfer with RW and short distance gait with RW.  Pt will benefit from continued acute services to address deficits.  PT recommends SNF for follow up therapy at D/C to decrease burden of care.  RN notified of plan and was notified to monitor HR during activity.  She states that no alarms went off but that pt had been in bradycardia all day.  PT's pulseox unable to pick up reading due to pts cold hands.     Follow Up Recommendations SNF;Supervision/Assistance - 24 hour    Equipment Recommendations  Other (comment) (defer to SNF)    Recommendations for Other Services       Precautions / Restrictions Precautions Precautions: Fall Precaution Comments: hx of dementia, severe posterior lean in standing, limited WB through LLE Restrictions Weight Bearing Restrictions: No Other Position/Activity Restrictions: RN states PWB in chart, however no order noted for PWB.  Pt tends to keep weight off of L ankle during upright mobility.       Mobility  Bed Mobility Overal bed mobility: Needs Assistance Bed Mobility: Supine to Sit     Supine to sit: Supervision     General bed mobility comments: Pt able to perform bed mobility with mod cues at S level with HOB flat and without rails.   Transfers Overall transfer level:  Needs assistance Equipment used: Rolling walker (2 wheeled) Transfers: Sit to/from Stand Sit to Stand: Max assist         General transfer comment: Pt requires max A to elevate from bed with facilitation for increased forward weight shift and cues for foot placement and hand placement  Ambulation/Gait Ambulation/Gait assistance: Max assist Ambulation Distance (Feet): 10 Feet Assistive device: Rolling walker (2 wheeled) Gait Pattern/deviations: Step-to pattern;Decreased stance time - left;Decreased step length - right;Leaning posteriorly;Trunk flexed;Narrow base of support Gait velocity: slow   General Gait Details: Pt requires max A for short distance gait in room with RW.  Very antalgic gait due to pain swelling in L ankle from cellulitis.  Requires assist for increased weight shift L to advance RLE as well as max A to control RW throughout.   Stairs            Wheelchair Mobility    Modified Rankin (Stroke Patients Only)       Balance Overall balance assessment: Needs assistance Sitting-balance support: Bilateral upper extremity supported;Feet supported Sitting balance-Leahy Scale: Fair   Postural control: Posterior lean;Right lateral lean Standing balance support: Bilateral upper extremity supported;During functional activity Standing balance-Leahy Scale: Zero Standing balance comment: Pt requires max A with use of RW to maintain standing balance.                              Pertinent Vitals/Pain Pain Assessment: Faces Faces Pain Scale: Hurts even more Pain Location: L ankle Pain Descriptors / Indicators: Aching Pain Intervention(s): Limited activity  within patient's tolerance;Repositioned    Home Living Family/patient expects to be discharged to:: Private residence Living Arrangements: Other relatives Available Help at Discharge: Family;Available PRN/intermittently             Additional Comments: Pt with history of dementia, therefore  history unclear, but per notes, sister plans for pt to D/C to SNF    Prior Function           Comments: no family available during session and pt with history of dementia     Hand Dominance        Extremity/Trunk Assessment               Lower Extremity Assessment: Generalized weakness;LLE deficits/detail   LLE Deficits / Details: pt very limited in L ankle motions due to pain from cellulitis  Cervical / Trunk Assessment: Kyphotic  Communication   Communication: Expressive difficulties  Cognition Arousal/Alertness: Awake/alert Behavior During Therapy: WFL for tasks assessed/performed Overall Cognitive Status: No family/caregiver present to determine baseline cognitive functioning (baseline dementia)                      General Comments General comments (skin integrity, edema, etc.): Note area of redness and swelling on medial part of L ankle    Exercises        Assessment/Plan    PT Assessment Patient needs continued PT services  PT Diagnosis Difficulty walking;Generalized weakness;Acute pain   PT Problem List Decreased strength;Decreased activity tolerance;Decreased balance;Decreased mobility;Decreased cognition;Decreased knowledge of use of DME;Decreased safety awareness;Decreased knowledge of precautions;Cardiopulmonary status limiting activity;Pain  PT Treatment Interventions DME instruction;Gait training;Functional mobility training;Therapeutic activities;Therapeutic exercise;Balance training;Patient/family education   PT Goals (Current goals can be found in the Care Plan section) Acute Rehab PT Goals Patient Stated Goal: none stated PT Goal Formulation: Patient unable to participate in goal setting Time For Goal Achievement: 03/25/14 Potential to Achieve Goals: Fair    Frequency Min 2X/week   Barriers to discharge Decreased caregiver support      Co-evaluation               End of Session Equipment Utilized During Treatment: Gait  belt Activity Tolerance: Patient limited by pain Patient left: in chair;with call bell/phone within reach;with chair alarm set Nurse Communication: Mobility status         Time: [id number] PT Time Calculation (min) (ACUTE ONLY): 26 min   Charges:   PT Evaluation $Initial PT Evaluation Tier I: 1 Procedure PT Treatments $Gait Training: 8-22 mins $Therapeutic Activity: 8-22 mins   PT G Codes:          Vista Deckarcell, Damir Leung Ann 03/18/2014, 3:31 PM

## 2014-03-18 NOTE — Plan of Care (Signed)
Problem: Phase I Progression Outcomes Goal: Pain controlled with appropriate interventions Outcome: Completed/Met Date Met:  03/18/14     

## 2014-03-18 NOTE — Plan of Care (Signed)
Problem: Phase I Progression Outcomes Goal: Voiding-avoid urinary catheter unless indicated Outcome: Completed/Met Date Met:  03/18/14

## 2014-03-19 ENCOUNTER — Inpatient Hospital Stay (HOSPITAL_COMMUNITY): Payer: Medicare HMO

## 2014-03-19 LAB — GLUCOSE, CAPILLARY
Glucose-Capillary: 64 mg/dL — ABNORMAL LOW (ref 70–99)
Glucose-Capillary: 83 mg/dL (ref 70–99)

## 2014-03-19 LAB — IRON AND TIBC
IRON: 25 ug/dL — AB (ref 42–135)
SATURATION RATIOS: 12 % — AB (ref 20–55)
TIBC: 214 ug/dL — AB (ref 215–435)
UIBC: 189 ug/dL (ref 125–400)

## 2014-03-19 MED ORDER — PREDNISONE 20 MG PO TABS
40.0000 mg | ORAL_TABLET | Freq: Once | ORAL | Status: AC
  Administered 2014-03-19: 40 mg via ORAL
  Filled 2014-03-19: qty 2

## 2014-03-19 NOTE — Progress Notes (Signed)
PROGRESS NOTE  Donald Reynolds [name] DOB: 1925-06-18 DOA: 03/17/2014 PCP: Diamantina ProvidenceANDERSON,Donald N., FNP  Assessment/Plan: 80 year old gentleman with history of dementia, atrial fibrillation on chronic Pradaxa presents with mild left leg cellulitis and debility in ER was noted to be bradycardic.   Atrial fibrillation -  - Pradaxa monitor  -telemetry  -decrease dose of metoprolol down to 12.5 -improved bradycardia  Essential hypertension - decreasing dose of metoprolol   Cellulitis - mild will cover with doxycycline -Dopplers negative  -x ray of left ankle -will give 1 dose of steroids as ? Gout to see if improvement - no fever so doubt septic joint Debility - fatigue likely multifactorial. Patient with active cellulitis. Slightly anemic. Somewhat bradycardic  -PT/OT- SNF -chest x ray ok  Hematuria - mild patient on pradaxa,  renal ultrasound with cysts, hematuria mild for now continue pradaxa  Bradycardia - decreased dose of metoprolol observe on telemetry  Anemia mild will obtain Hemoccult stool and anemia panel  Code Status: full Family Communication: spoke with sister 12/3 Disposition Plan: SNF   Consultants:    Procedures:  Renal u/s    HPI/Subjective: No overnight events C/o pain in left ankle  Objective: Filed Vitals:   03/19/14 0400  BP: 122/63  Pulse: 59  Temp: 98.6 F (37 C)  Resp: 16    Intake/Output Summary (Last 24 hours) at 03/19/14 0856 Last data filed at 03/18/14 1620  Gross per 24 hour  Intake      0 ml  Output   1200 ml  Net  -1200 ml   Filed Weights   03/17/14 1617 03/18/14 0040  Weight: 95.255 kg (210 lb) 84.1 kg (185 lb 6.5 oz)    Exam:   General:  Pleasant/cooperative  Cardiovascular: rrr  Respiratory: clear anterior, no wheezing  Abdomen: +Bs, soft  Pain with dosiflexion of left ankle  Skin- chronic changes - vitilgo  Data Reviewed: Basic Metabolic Panel:  Recent Labs Lab 03/17/14 1635 03/18/14 0539   NA 139 141  K 4.2 4.1  CL 104 104  CO2 24 25  GLUCOSE 96 81  BUN 16 15  CREATININE 1.24 1.04  CALCIUM 9.0 9.4  MG  --  1.9  PHOS  --  3.2   Liver Function Tests:  Recent Labs Lab 03/18/14 0539  AST 12  ALT 7  ALKPHOS 87  BILITOT 0.5  PROT 6.5  ALBUMIN 3.5   No results for input(s): LIPASE, AMYLASE in the last 168 hours. No results for input(s): AMMONIA in the last 168 hours. CBC:  Recent Labs Lab 03/17/14 1635 03/18/14 0539  WBC 4.3 5.0  NEUTROABS 3.2  --   HGB 11.2* 11.9*  HCT 33.1* 35.5*  MCV 90.7 88.5  PLT 126* 132*   Cardiac Enzymes: No results for input(s): CKTOTAL, CKMB, CKMBINDEX, TROPONINI in the last 168 hours. BNP (last 3 results)  Recent Labs  01/11/14 0947  PROBNP 2860.0*   CBG: No results for input(s): GLUCAP in the last 168 hours.  Recent Results (from the past 240 hour(s))  Urine culture     Status: None   Collection Time: 03/17/14  7:46 PM  Result Value Ref Range Status   Specimen Description URINE, CATHETERIZED  Final   Special Requests NONE  Final   Culture  Setup Time   Final    03/18/2014 01:09 Performed at [facility]    Colony Count NO GROWTH Performed at [facility]   Final   Culture NO GROWTH Performed at [facility]   Final   Report Status 03/18/2014 FINAL  Final     Studies: X-ray Chest Pa And Lateral  03/18/2014   CLINICAL DATA:  Acute chills, cellulitis and bradycardia. Initial encounter.  EXAM: CHEST  2 VIEW  COMPARISON:  01/11/2014 and 09/26/2008 radiographs  FINDINGS: Cardiomegaly and fullness of the superior mediastinum are unchanged.  There is no evidence of focal airspace disease, pulmonary edema, suspicious pulmonary nodule/mass, pleural effusion, or pneumothorax. No acute bony abnormalities are identified.  IMPRESSION: Cardiomegaly without evidence of acute cardiopulmonary disease.   Electronically Signed   By: Laveda AbbeJeff  Hu M.D.   On: 03/18/2014 03:08   Ct Head Wo  Contrast  03/18/2014   CLINICAL DATA:  Ataxia.  EXAM: CT HEAD WITHOUT CONTRAST  TECHNIQUE: Contiguous axial images were obtained from the base of the skull through the vertex without intravenous contrast.  COMPARISON:  05/30/2011  FINDINGS: There is no evidence of intracranial hemorrhage, brain edema, or other signs of acute infarction. There is no evidence of intracranial mass lesion or mass effect. No abnormal extraaxial fluid collections are identified.  Mild diffuse cerebral atrophy is again demonstrated. Moderate chronic small vessel disease is unchanged in appearance. Ventricles are stable in size. No skull abnormality identified.  IMPRESSION: No acute intracranial findings.  Stable cerebral atrophy and chronic small vessel disease.   Electronically Signed   By: Myles RosenthalJohn  Stahl M.D.   On: 03/18/2014 18:39   [place] Renal  03/18/2014   CLINICAL DATA:  Bibasilar crackles, hematuria, hypertension, acute renal failure  EXAM: RENAL/URINARY TRACT ULTRASOUND COMPLETE  COMPARISON:  Abdomen ultrasound 09/29/2008  FINDINGS: Right Kidney:  Length: 11.5 cm. Normal cortical thickness. Increased cortical echogenicity. No mass, hydronephrosis or shadowing calcification.  Left Kidney:  Length: 11.9 cm. Normal cortical thickness. Increased cortical echogenicity. Multiple cysts, largest at upper pole 2.6 x 2.5 x 3.7 cm. No solid mass, hydronephrosis or shadowing calcification.  Bladder:  Partially distended.  Bladder wall appears thickened.  Prostate gland:  Markedly enlarged 8.4 x 8.2 x 9.1 cm.  Incidentally noted cholelithiasis, mobile calcified gallstone 2.6 cm diameter noted.  IMPRESSION: Medical renal disease changes of both kidneys without hydronephrosis.  LEFT renal cysts.  Markedly enlarged prostate gland with note of mild bladder wall thickening which could indicate muscular hypertrophy and chronic outlet obstruction.  Cholelithiasis, noted previously.   Electronically Signed   By: Ulyses SouthwardMark  Boles M.D.   On: 03/18/2014 11:10     Scheduled Meds: . benazepril  20 mg Oral Daily  . dabigatran  150 mg Oral Q12H  . doxycycline  100 mg Oral Q12H  . metoprolol tartrate  12.5 mg Oral Daily  . PHENobarbital  97.2 mg Oral QHS  . predniSONE  40 mg Oral Once  . sodium chloride  3 mL Intravenous Q12H   Continuous Infusions:  Antibiotics Given (last 72 hours)    Date/Time Action Medication Dose   03/17/14 2228 Given   doxycycline (VIBRA-TABS) tablet 100 mg 100 mg   03/18/14 1031 Given   doxycycline (VIBRA-TABS) tablet 100 mg 100 mg   03/18/14 2226 Given   doxycycline (VIBRA-TABS) tablet 100 mg 100 mg      Active Problems:   Essential hypertension   Atrial fibrillation   Convulsions   Cellulitis   Dementia   Debility   Hematuria   Bradycardia    Time spent: 35 min    Dollie Mayse  [facility] Pager [phone]. If 7PM-7AM, please contact night-coverage at [email or web address], password [email or web address] 03/19/2014, 8:56  AM  LOS: 2 days

## 2014-03-19 NOTE — Progress Notes (Signed)
Hypoglycemic Event  CBG: 64  Treatment: 15 GM carbohydrate snack  Symptoms: Hungry  Follow-up CBG: Time: 1254 CBG Result:  83  Possible Reasons for Event: Inadequate meal intake    Donald Reynolds, Donald Reynolds  Remember to initiate Hypoglycemia Order Set & complete

## 2014-03-19 NOTE — Progress Notes (Signed)
Medicare Important Message given?  YES (If response is "NO", the following Medicare IM given date fields will be blank) Date Medicare IM given:   Medicare IM given by:  Jasper Ruminski 

## 2014-03-19 NOTE — Plan of Care (Signed)
Problem: Phase III Progression Outcomes Goal: Activity at appropriate level-compared to baseline (UP IN CHAIR FOR HEMODIALYSIS)  Outcome: Progressing     

## 2014-03-19 NOTE — Evaluation (Signed)
Occupational Therapy Evaluation Patient Details Name: Donald Reynolds MRN: [id number] DOB: Nov 09, 1925 Today's Date: 03/19/2014    History of Present Illness Donald Reynolds presents w/ past medical history of Hypertension; Coronary artery disease; Diarrhea; Hypokalemia; History of seizure disorder; Nausea & vomiting; Vitiligo; Acute renal failure; Deep venous thrombosis; Hiatal hernia (2003); Atrial fibrillation; CHF (congestive heart failure); Anemia; Arthritis; Heart murmur; and Seizures.     Clinical Impression   Prior to admission, pt lived with his sister for supervision.  He walked with a walker, made his own breakfast, and performed ADL independently.  Pt presents with a painful L foot interfering with ability to perform transfers, ambulation and self care.  Will need ST rehab to return to baseline so sister can manage him at home.  Will follow acutely.   Follow Up Recommendations  SNF;Supervision/Assistance - 24 hour    Equipment Recommendations       Recommendations for Other Services       Precautions / Restrictions Precautions Precautions: Fall Precaution Comments: hx of dementia, decreased WB on L foot Restrictions Weight Bearing Restrictions: Yes Other Position/Activity Restrictions: no WB precautions on chart      Mobility Bed Mobility   Bed Mobility: Supine to Sit     Supine to sit: Supervision     General bed mobility comments: used rail, HOB up, assist to scoot hips to EOB  Transfers Overall transfer level: Needs assistance Equipment used: Rolling walker (2 wheeled) Transfers: Sit to/from [employer] CorporationStand;Squat Pivot Transfers Sit to Stand: Max assist   Squat pivot transfers: Max assist     General transfer comment: assist for hand placement and forward weight shift     Balance                                            ADL Overall ADL's : Needs assistance/impaired Eating/Feeding: Independent;Sitting   Grooming: Wash/dry hands;Wash/dry  face;Sitting;Set up   Upper Body Bathing: Set up;Sitting   Lower Body Bathing: Total assistance;Sit to/from stand   Upper Body Dressing : Set up;Sitting   Lower Body Dressing: Total assistance;Sit to/from stand   Toilet Transfer: Maximal assistance;Stand-pivot             General ADL Comments: Pt with urine in his bed.  Did not attempt to call for assist.  Cleaned pt up and assisted to chair.     Vision                     Perception     Praxis      Pertinent Vitals/Pain Pain Assessment: Faces Faces Pain Scale: Hurts even more Pain Location: L foot  Pain Descriptors / Indicators: Sore Pain Intervention(s): Repositioned     Hand Dominance Right   Extremity/Trunk Assessment Upper Extremity Assessment Upper Extremity Assessment: Overall WFL for tasks assessed   Lower Extremity Assessment Lower Extremity Assessment: Defer to PT evaluation   Cervical / Trunk Assessment Cervical / Trunk Assessment: Kyphotic   Communication Communication Communication: HOH;Expressive difficulties   Cognition Arousal/Alertness: Awake/alert Behavior During Therapy: WFL for tasks assessed/performed Overall Cognitive Status: History of cognitive impairments - at baseline                     General Comments       Exercises       Shoulder Instructions      Home Living  Family/patient expects to be discharged to:: Skilled nursing facility                                        Prior Functioning/Environment Level of Independence: Independent with assistive device(s)        Comments: pt performed self care, cooked his breakfast, walked with a cane    OT Diagnosis: Generalized weakness;Acute pain;Cognitive deficits   OT Problem List: Decreased strength;Decreased activity tolerance;Impaired balance (sitting and/or standing);Decreased cognition;Decreased safety awareness;Decreased knowledge of use of DME or AE;Pain   OT  Treatment/Interventions: Self-care/ADL training;DME and/or AE instruction;Patient/family education;Balance training;Therapeutic activities    OT Goals(Current goals can be found in the care plan section) Acute Rehab OT Goals Patient Stated Goal: none stated OT Goal Formulation: With patient Time For Goal Achievement: 03/26/14 Potential to Achieve Goals: Good ADL Goals Pt Will Perform Grooming: standing;with min assist Pt Will Perform Lower Body Bathing: sit to/from stand;with min assist Pt Will Perform Lower Body Dressing: sit to/from stand;with min assist Pt Will Transfer to Toilet: ambulating;regular height toilet;with min assist Pt Will Perform Toileting - Clothing Manipulation and hygiene: sit to/from stand;with min assist  OT Frequency: Min 2X/week   Barriers to D/C:            Co-evaluation              End of Session Equipment Utilized During Treatment: Gait belt;Rolling walker Nurse Communication:  (has urinal, condom cath fell off)  Activity Tolerance: Patient tolerated treatment well Patient left: in chair;with call bell/phone within reach;with chair alarm set   Time: [phone] OT Time Calculation (min): 42 min Charges:  OT General Charges $OT Visit: 1 Procedure OT Evaluation $Initial OT Evaluation Tier I: 1 Procedure OT Treatments $Self Care/Home Management : 23-37 mins G-Codes:    Evern BioMayberry, Binh Doten Lynn 03/19/2014, 12:26 PM  [phone]

## 2014-03-20 DIAGNOSIS — R946 Abnormal results of thyroid function studies: Secondary | ICD-10-CM

## 2014-03-20 DIAGNOSIS — I4891 Unspecified atrial fibrillation: Secondary | ICD-10-CM

## 2014-03-20 DIAGNOSIS — R7989 Other specified abnormal findings of blood chemistry: Secondary | ICD-10-CM | POA: Diagnosis present

## 2014-03-20 MED ORDER — SENNOSIDES-DOCUSATE SODIUM 8.6-50 MG PO TABS
2.0000 | ORAL_TABLET | Freq: Every day | ORAL | Status: DC
  Administered 2014-03-20 – 2014-03-21 (×2): 2 via ORAL
  Filled 2014-03-20 (×2): qty 2

## 2014-03-20 MED ORDER — HYDROCODONE-ACETAMINOPHEN 5-325 MG PO TABS: 1.0000 | ORAL_TABLET | Freq: Four times a day (QID) | ORAL | Status: DC | PRN

## 2014-03-20 MED ORDER — CYANOCOBALAMIN 1000 MCG/ML IJ SOLN
1000.0000 ug | Freq: Once | INTRAMUSCULAR | Status: AC
  Administered 2014-03-20: 1000 ug via INTRAMUSCULAR
  Filled 2014-03-20: qty 1

## 2014-03-20 NOTE — Clinical Social Work Placement (Addendum)
Clinical Social Work Department CLINICAL SOCIAL WORK PLACEMENT NOTE 03/20/2014  Patient:  Donald Reynolds,Donald Reynolds  Account Number:  [id number] Admit date:  03/17/2014  Clinical Social Worker:  Vivi Barrackrystal Patrick-jefferson, LCSWA  Date/time:  03/20/2014 05:16 PM  Clinical Social Work is seeking post-discharge placement for this patient at the following level of care:   SKILLED NURSING   (*CSW will update this form in Epic as items are completed)   03/20/2014  Patient/family provided with [facility] Department of Clinical Social Work's list of facilities offering this level of care within the geographic area requested by the patient (or if unable, by the patient's family).  03/20/2014  Patient/family informed of their freedom to choose among providers that offer the needed level of care, that participate in Medicare, Medicaid or managed care program needed by the patient, have an available bed and are willing to accept the patient.  03/20/2014  Patient/family informed of [facility]' ownership interest in [facility], as well as of the fact that they are under no obligation to receive care at this facility.  PASARR submitted to EDS on Existing PASARR number received on Existing  FL2 transmitted to all facilities in geographic area requested by pt/family on  03/20/2014 FL2 transmitted to all facilities within larger geographic area on   Patient informed that his/her managed care company has contracts with or will negotiate with  certain facilities, including the following:  [employer]  Auth number provided to Derenda FennelBashira Nixon by [facility] RNCM   Patient/family informed of bed offers received:03/22/14   [facility] Physician recommends and patient chooses bed at    [facility]  03/[facility]/14  Patient to be transferred to facility by Ambulance  [facility]) Patient and family notified of transfer on :  03/22/14 Name of family  member notified:  Sister:Anne Silverio-  Sister  The following physician request were entered in Epic:   Additional Comments:  Forensic psychologistCrystal Patrick-Jefferson, LCSWA Weekend Clinical Social Worker [phone]  03/22/14  Ok per MD [facility].  Patient notified and is agreeable with d/c plan and is looking forward to d/c.  Nursing notified to call report. Patient's Sister Thurston Holenne notified and will go and sign report.  No further CSW needs identified. CSW signing off.  Lorri Frederickonna T. Jaci LazierCrowder, KentuckyLCSW [phone]

## 2014-03-20 NOTE — Progress Notes (Signed)
Chart reviewed.   PROGRESS NOTE  Donald Reynolds [id number] DOB: June 21, 1925 DOA: 03/17/2014 PCP: Donald Reynolds  Assessment/Plan: 80 year old gentleman with history of dementia, atrial fibrillation on chronic Pradaxa presents with mild left leg cellulitis and debility in ER was noted to be bradycardic.   Atrial fibrillation - on pradaxa. No further bradycardia.    Essential hypertension - monitor  Cellulitis - No further pain, erythema. D/c abx  Elevated TSH: check free T4, FT3  Debility - fatigue likely multifactorial. B12 borderline low. Will give injection. TSH high, may be hypothyroid  Hematuria - mild patient on pradaxa,  renal ultrasound with cysts, hematuria mild for now continue pradaxa  Bradycardia - none further on lower metoprolol dose  Anemia mild: panel consistent with chronic disease and borderline B12 deficiency  Constipation: laxative  Code Status: full Family Communication: spoke with sister 12/3 Disposition Plan: SNF  HPI/Subjective: No pain. Has not had BM since admission  Objective: Filed Vitals:   03/20/14 1440  BP: 143/115  Pulse: 77  Temp: 97.9 F (36.6 C)  Resp: 16    Intake/Output Summary (Last 24 hours) at 03/20/14 1502 Last data filed at 03/20/14 1330  Gross per 24 hour  Intake    340 ml  Output   1150 ml  Net   -810 ml   Filed Weights   03/17/14 1617 03/18/14 0040  Weight: 95.255 kg (210 lb) 84.1 kg (185 lb 6.5 oz)    Exam:   General:  Pleasant/cooperative  Cardiovascular: rrr without MGR  Respiratory: clear anterior, no wheezing rhonchi or rales  Abdomen: +Bs, soft, NT, ND  Ext no tenderness, warmth, or erythema left ankle. Slight induration.  Data Reviewed: Basic Metabolic Panel:  Recent Labs Lab 03/17/14 1635 03/18/14 0539  NA 139 141  K 4.2 4.1  CL 104 104  CO2 24 25  GLUCOSE 96 81  BUN 16 15  CREATININE 1.24 1.04  CALCIUM 9.0 9.4  MG  --  1.9  PHOS  --  3.2   Liver Function  Tests:  Recent Labs Lab 03/18/14 0539  AST 12  ALT 7  ALKPHOS 87  BILITOT 0.5  PROT 6.5  ALBUMIN 3.5   No results for input(s): LIPASE, AMYLASE in the last 168 hours. No results for input(s): AMMONIA in the last 168 hours. CBC:  Recent Labs Lab 03/17/14 1635 03/18/14 0539  WBC 4.3 5.0  NEUTROABS 3.2  --   HGB 11.2* 11.9*  HCT 33.1* 35.5*  MCV 90.7 88.5  PLT 126* 132*   Cardiac Enzymes: No results for input(s): CKTOTAL, CKMB, CKMBINDEX, TROPONINI in the last 168 hours. BNP (last 3 results)  Recent Labs  01/11/14 0947  PROBNP 2860.0*   CBG:  Recent Labs Lab 03/19/14 1159 03/19/14 1254  GLUCAP 64* 83    Recent Results (from the past 240 hour(s))  Urine culture     Status: None   Collection Time: 03/17/14  7:46 PM  Result Value Ref Range Status   Specimen Description URINE, CATHETERIZED  Final   Special Requests NONE  Final   Culture  Setup Time   Final    03/18/2014 01:09 Performed at [facility]    Colony Count NO GROWTH Performed at [facility]   Final   Culture NO GROWTH Performed at [facility]   Final   Report Status 03/18/2014 FINAL  Final     Studies: Dg Ankle Complete Left  03/19/2014   CLINICAL DATA:  Medial left ankle  pain  EXAM: LEFT ANKLE COMPLETE - 3+ VIEW  COMPARISON:  05/17/2013  FINDINGS: Three views of left ankle submitted. Stable old fracture deformity of medial malleolus. No acute fracture or subluxation. Diffuse osteopenia. Ankle mortise is preserved. Small plantar spur of calcaneus. There is soft tissue swelling adjacent to medial malleolus. Linear soft tissue calcification adjacent to medial malleolus suspicious for calcific tendinosis.  IMPRESSION: No acute fracture or subluxation. Stable old fracture deformity of medial malleolus. Soft tissue swelling adjacent to medial malleolus. Linear calcifications adjacent to medial malleolus suspicious for calcific tendinosis.   Electronically Signed   By: Natasha MeadLiviu   Pop M.D.   On: 03/19/2014 09:38   Ct Head Wo Contrast  03/18/2014   CLINICAL DATA:  Ataxia.  EXAM: CT HEAD WITHOUT CONTRAST  TECHNIQUE: Contiguous axial images were obtained from the base of the skull through the vertex without intravenous contrast.  COMPARISON:  05/30/2011  FINDINGS: There is no evidence of intracranial hemorrhage, brain edema, or other signs of acute infarction. There is no evidence of intracranial mass lesion or mass effect. No abnormal extraaxial fluid collections are identified.  Mild diffuse cerebral atrophy is again demonstrated. Moderate chronic small vessel disease is unchanged in appearance. Ventricles are stable in size. No skull abnormality identified.  IMPRESSION: No acute intracranial findings.  Stable cerebral atrophy and chronic small vessel disease.   Electronically Signed   By: Myles RosenthalJohn  Stahl M.D.   On: 03/18/2014 18:39    Scheduled Meds: . benazepril  20 mg Oral Daily  . cyanocobalamin  1,000 mcg Intramuscular Once  . dabigatran  150 mg Oral Q12H  . metoprolol tartrate  12.5 mg Oral Daily  . PHENobarbital  97.2 mg Oral QHS  . senna-docusate  2 tablet Oral Daily  . sodium chloride  3 mL Intravenous Q12H   Continuous Infusions:  Antibiotics Given (last 72 hours)    Date/Time Action Medication Dose   03/17/14 2228 Given   doxycycline (VIBRA-TABS) tablet 100 mg 100 mg   03/18/14 1031 Given   doxycycline (VIBRA-TABS) tablet 100 mg 100 mg   03/18/14 2226 Given   doxycycline (VIBRA-TABS) tablet 100 mg 100 mg   03/19/14 1016 Given   doxycycline (VIBRA-TABS) tablet 100 mg 100 mg   03/19/14 2104 Given   doxycycline (VIBRA-TABS) tablet 100 mg 100 mg   03/20/14 0940 Given   doxycycline (VIBRA-TABS) tablet 100 mg 100 mg     Time spent: 25 min  Aastha Dayley L  [facility] Text page [email or web address], password [email or web address] 03/20/2014, 3:02 PM  LOS: 3 days

## 2014-03-20 NOTE — Clinical Social Work Psychosocial (Signed)
Clinical Social Work Department BRIEF PSYCHOSOCIAL ASSESSMENT 03/20/2014  Patient:  Donald Reynolds,Donald Reynolds     Account Number:  [id number]     Admit date:  03/17/2014  Clinical Social Worker:  Hubert Azure  Date/Time:  03/20/2014 05:18 PM  Referred by:  Physician  Date Referred:  03/20/2014 Referred for  SNF Placement   Other Referral:   Interview type:  Patient Other interview type:    PSYCHOSOCIAL DATA Living Status:  FAMILY Admitted from facility:   Level of care:   Primary support name:  Ifeoluwa Beller ([phone]) Primary support relationship to patient:  SIBLING Degree of support available:   Good    CURRENT CONCERNS Current Concerns  Post-Acute Placement   Other Concerns:    SOCIAL WORK ASSESSMENT / PLAN CSW met with patient who was alert and oriented. CSW introduced self and explained role. CSW explained SNF placement process and discussed d/c plan with patient. Per patient, he currently resides with sister Webb Silversmith and has 2 canes he uses to assist with ambulation. Patient is agreeable to SNF placement and prefers [facility] facilities. Patient reports "I know I need to get stronger, as long as I'm getting better."   Assessment/plan status:  Other - See comment Other assessment/ plan:   CSW to update FL2 for SNF placement.   Information/referral to community resources:    PATIENT'S/FAMILY'S RESPONSE TO PLAN OF CARE: Patient is agreeable to SNF placement and expressed a strong desire to "improve my strength" and return home.    [place], [place] Weekend Clinical Social Worker [phone]

## 2014-03-21 LAB — T4, FREE: Free T4: 1.02 ng/dL (ref 0.80–1.80)

## 2014-03-21 LAB — T3, FREE: T3, Free: 2.4 pg/mL (ref 2.3–4.2)

## 2014-03-21 MED ORDER — DOCUSATE SODIUM 100 MG PO CAPS
100.0000 mg | ORAL_CAPSULE | Freq: Two times a day (BID) | ORAL | Status: DC
  Administered 2014-03-22: 100 mg via ORAL
  Filled 2014-03-21 (×2): qty 1

## 2014-03-21 NOTE — Progress Notes (Signed)
PROGRESS NOTE  Donald RoseWilliam Reynolds [id number] DOB: 1925/05/11 DOA: 03/17/2014 PCP: Diamantina ProvidenceANDERSON,TAKELA N., FNP  Assessment/Plan: 80 year old gentleman with history of dementia, atrial fibrillation on Pradaxa presents with mild left leg cellulitis and debility in ER was noted to be bradycardic.   Atrial fibrillation - on pradaxa. No further bradycardia.  D/c telemetry  Essential hypertension - monitor  Cellulitis - No further pain, erythema. Stable off abx  Elevated TSH: FT3, FT4 normal. Would repeat in 3 months  Debility - fatigue likely multifactorial. B12 borderline low. Will give injection. TSH high, may be hypothyroid  Hematuria - mild patient on pradaxa,  renal ultrasound with cysts, hematuria mild for now continue pradaxa  Bradycardia - none further on lower metoprolol dose  Anemia mild: panel consistent with chronic disease and borderline B12 deficiency  Constipation: had bm with senekot  Code Status: full Family Communication: spoke with sister 12/3 Disposition Plan: SNF  HPI/Subjective: No complaints. Had bm.  Objective: Filed Vitals:   03/21/14 1015  BP: 135/77  Pulse:   Temp:   Resp:     Intake/Output Summary (Last 24 hours) at 03/21/14 1207 Last data filed at 03/21/14 0855  Gross per 24 hour  Intake    598 ml  Output    645 ml  Net    -47 ml   Filed Weights   03/17/14 1617 03/18/14 0040  Weight: 95.255 kg (210 lb) 84.1 kg (185 lb 6.5 oz)    Exam:   General:  Pleasant/cooperative  Cardiovascular: rrr without MGR  Respiratory: clear anterior, no wheezing rhonchi or rales  Abdomen: +Bs, soft, NT, ND  Ext no tenderness, warmth, or erythema left ankle. Slight induration.  Data Reviewed: Basic Metabolic Panel:  Recent Labs Lab 03/17/14 1635 03/18/14 0539  NA 139 141  K 4.2 4.1  CL 104 104  CO2 24 25  GLUCOSE 96 81  BUN 16 15  CREATININE 1.24 1.04  CALCIUM 9.0 9.4  MG  --  1.9  PHOS  --  3.2   Liver Function Tests:  Recent Labs Lab  03/18/14 0539  AST 12  ALT 7  ALKPHOS 87  BILITOT 0.5  PROT 6.5  ALBUMIN 3.5   No results for input(s): LIPASE, AMYLASE in the last 168 hours. No results for input(s): AMMONIA in the last 168 hours. CBC:  Recent Labs Lab 03/17/14 1635 03/18/14 0539  WBC 4.3 5.0  NEUTROABS 3.2  --   HGB 11.2* 11.9*  HCT 33.1* 35.5*  MCV 90.7 88.5  PLT 126* 132*   Cardiac Enzymes: No results for input(s): CKTOTAL, CKMB, CKMBINDEX, TROPONINI in the last 168 hours. BNP (last 3 results)  Recent Labs  01/11/14 0947  PROBNP 2860.0*   CBG:  Recent Labs Lab 03/19/14 1159 03/19/14 1254  GLUCAP 64* 83    Recent Results (from the past 240 hour(s))  Urine culture     Status: None   Collection Time: 03/17/14  7:46 PM  Result Value Ref Range Status   Specimen Description URINE, CATHETERIZED  Final   Special Requests NONE  Final   Culture  Setup Time   Final    03/18/2014 01:09 Performed at [facility]    Colony Count NO GROWTH Performed at [facility]   Final   Culture NO GROWTH Performed at [facility]   Final   Report Status 03/18/2014 FINAL  Final     Studies: No results found.  Scheduled Meds: . benazepril  20 mg Oral Daily  .  dabigatran  150 mg Oral Q12H  . [START ON 03/22/2014] docusate sodium  100 mg Oral BID  . metoprolol tartrate  12.5 mg Oral Daily  . PHENobarbital  97.2 mg Oral QHS  . sodium chloride  3 mL Intravenous Q12H   Continuous Infusions:  Antibiotics Given (last 72 hours)    Date/Time Action Medication Dose   03/18/14 2226 Given   doxycycline (VIBRA-TABS) tablet 100 mg 100 mg   03/19/14 1016 Given   doxycycline (VIBRA-TABS) tablet 100 mg 100 mg   03/19/14 2104 Given   doxycycline (VIBRA-TABS) tablet 100 mg 100 mg   03/20/14 0940 Given   doxycycline (VIBRA-TABS) tablet 100 mg 100 mg     Time spent: 25 min  Gareld Obrecht L  [facility] Text page [email or web address], password [email or web address] 03/21/2014, 12:07 PM  LOS: 4 days

## 2014-03-22 DIAGNOSIS — L03119 Cellulitis of unspecified part of limb: Secondary | ICD-10-CM

## 2014-03-22 MED ORDER — METOPROLOL TARTRATE 25 MG PO TABS: 12.5000 mg | ORAL_TABLET | Freq: Every day | ORAL | Status: DC

## 2014-03-22 MED ORDER — HYDROCODONE-ACETAMINOPHEN 5-325 MG PO TABS: 1.0000 | ORAL_TABLET | Freq: Four times a day (QID) | ORAL | Status: DC | PRN

## 2014-03-22 MED ORDER — DSS 100 MG PO CAPS: 100.0000 mg | ORAL_CAPSULE | Freq: Two times a day (BID) | ORAL | Status: DC

## 2014-03-22 MED ORDER — ACETAMINOPHEN 325 MG PO TABS: 650.0000 mg | ORAL_TABLET | Freq: Four times a day (QID) | ORAL | Status: DC | PRN

## 2014-03-22 NOTE — Progress Notes (Signed)
Loleta RoseWilliam Hinchman to be D/C'd Skilled nursing facility per MD order.  Discussed with the patient and all questions fully answered.    Medication List    STOP taking these medications        [facility] [employer] W[employer]ER Misc      TAKE these medications        acetaminophen 325 MG tablet  Commonly known as:  TYLENOL  Take 2 tablets (650 mg total) by mouth every 6 (six) hours as needed for mild pain (or Fever >/= 101).     benazepril 20 MG tablet  Commonly known as:  LOTENSIN  Take 20 mg by mouth daily.     cholecalciferol 1000 UNITS tablet  Commonly known as:  VITAMIN D  Take 2,000 Units by mouth daily.     DSS 100 MG Caps  Take 100 mg by mouth 2 (two) times daily.     FERREX 150 150 MG capsule  Generic drug:  iron polysaccharides  Take 150 mg by mouth daily.     HYDROcodone-acetaminophen 5-325 MG per tablet  Commonly known as:  NORCO/VICODIN  Take 1 tablet by mouth every 6 (six) hours as needed for moderate pain or severe pain.     metoprolol tartrate 25 MG tablet  Commonly known as:  LOPRESSOR  Take 0.5 tablets (12.5 mg total) by mouth daily.     PHENobarbital 97.2 MG tablet  Commonly known as:  LUMINAL  Take 97.2 mg by mouth at bedtime.     PRADAXA 150 MG Caps capsule  Generic drug:  dabigatran  Take 150 mg by mouth every 12 (twelve) hours.     vitamin B-12 500 MCG tablet  Commonly known as:  CYANOCOBALAMIN  Take 500 mcg by mouth daily.     vitamin E 400 UNIT capsule  Take 400 Units by mouth daily.        VVS, Skin clean, dry and intact without evidence of skin break down, no evidence of skin tears noted. IV catheter discontinued intact. Site without signs and symptoms of complications. Dressing and pressure applied.  An After Visit Summary was printed and given to the patient.  D/c education completed with patient/family including follow up instructions, medication list, d/c activities limitations if indicated, with other d/c instructions as indicated by MD -  patient able to verbalize understanding, all questions fully answered.   Patient instructed to return to ED, call 911, or call MD for any changes in condition.   [facility]  Fantasia Jinkins D 03/22/2014 3:32 PM

## 2014-03-22 NOTE — Care Management Note (Signed)
    Page 1 of 1   03/22/2014     7:51:06 PM CARE MANAGEMENT NOTE 03/22/2014  Patient:  Donald Reynolds,Donald Reynolds   Account Number:  [id number]  Date Initiated:  03/22/2014  Documentation initiated by:  Letha CapeAYLOR,Maclovia Uher  Subjective/Objective Assessment:   dx cellulits  admit-lives with family.     Action/Plan:   pt eval- rec snf.   Anticipated DC Date:  03/22/2014   Anticipated DC Plan:  SKILLED NURSING FACILITY  In-house referral  Clinical Social Worker      DC Planning Services  CM consult      Choice offered to / List presented to:             Status of service:  Completed, signed off Medicare Important Message given?  YES (If response is "NO", the following Medicare IM given date fields will be blank) Date Medicare IM given:  03/22/2014 Medicare IM given by:  Letha CapeAYLOR,Paizlie Klaus Date Additional Medicare IM given:   Additional Medicare IM given by:    Discharge Disposition:  SKILLED NURSING FACILITY  Per UR Regulation:  Reviewed for med. necessity/level of care/duration of stay  If discussed at Long Length of Stay Meetings, dates discussed:    Comments:

## 2014-03-22 NOTE — Progress Notes (Signed)
Medicare Important Message given?  YES (If response is "NO", the following Medicare IM given date fields will be blank) Date Medicare IM given:  03/22/14 Medicare IM given by:  Symantha Steeber 

## 2014-03-22 NOTE — Discharge Summary (Signed)
Physician Discharge Summary  Donald Reynolds [id number] DOB: June 06, 1925 DOA: 03/17/2014  PCP: Diamantina ProvidenceANDERSON,TAKELA N., FNP  Admit date: 03/17/2014 Discharge date: 03/22/2014  Time spent: greater than 30 minutes  Discharge Diagnoses:    Cellulitis, left lower extremity Active Problems:   Essential hypertension   Atrial fibrillation   Seizure disorder   Dementia   Debility   Hematuria   Bradycardia   Elevated TSH  Discharge Condition:  stable  Filed Weights   03/17/14 1617 03/18/14 0040  Weight: 95.255 kg (210 lb) 84.1 kg (185 lb 6.5 oz)    History of present illness:  80 y.o. male   has a past medical history of Hypertension; Coronary artery disease; Diarrhea; Hypokalemia; History of seizure disorder; Nausea & vomiting; Vitiligo; Acute renal failure; Deep venous thrombosis; Hiatal hernia (2003); Atrial fibrillation; CHF (congestive heart failure); Anemia; Arthritis; Heart murmur; and Seizures.   Presented with  1 day hx of left leg swelling,redness and some pain. No fever but some chills. No chest pain no fever but some chills.  Patient had a venous doppler that was negative. [facility] and was noted not be able to walk well. He was given 4 prong cane but he was having trouble ambulating. He lives with his elderly sister. Patient was noted to be fatigued and started to be incontinent of urine. patient has hx of dementia at baseline oriented to self but not time or place. Per sister he has had difficulty with ambulating that is progressing over past 24 hours. noted to be slightly bradyardic in the ED.  Hospital Course:   Cellulitis - completed course of antibiotics. Pain free. Erythema gone. Mild swelling continues, but per patient this has been intermittently present in the past. Likely has venous stasis  Atrial fibrillation - on pradaxa. Due to bradycardia, metoprolol dose decreased to 12.5milligrams twice daily. Heart rate has remained in the 70s without  further bradycardia.  Noted to have elevated TSH at 6: FT3, FT4 normal. Would repeat in 3 months, as may be euthyroid sick.  Debility - fatigue likely multifactorial. B12 borderline low. Given B-12 injection. Likely deconditioning as well. Physical therapy consulted and recommended skilled nursing facility placement. Patient has a bed today and is stable for discharge.  Bradycardia - none further on lower metoprolol dose  Anemia mild: panel consistent with chronic disease and borderline B12 deficiency. Given B-12 injection.  Constipation: had results with laxatives   Procedures:  none  Consultations:  none  Discharge Exam: Filed Vitals:   03/22/14 1254  BP: 123/85  Pulse: 61  Temp: 98.3 F (36.8 C)  Resp:     General: In chair, eating lunch. Comfortable. Oriented and appropriate. Cardiovascular: Irregularly irregular no murmurs gallops rubs Respiratory: Clear to auscultation bilaterally without wheezes rhonchi or rales Abdomen soft nontender nondistended Extremities left ankle with mild edema. No tenderness warmth or erythema. Varicosities noted   Discharge Instructions    Diet - low sodium heart healthy    Complete by:  As directed      Walk with assistance    Complete by:  As directed           Current Discharge Medication List    START taking these medications   Details  acetaminophen (TYLENOL) 325 MG tablet Take 2 tablets (650 mg total) by mouth every 6 (six) hours as needed for mild pain (or Fever >/= 101).    docusate sodium 100 MG CAPS Take 100 mg by mouth 2 (two) times daily.  Qty: 10 capsule, Refills: 0      CONTINUE these medications which have CHANGED   Details  HYDROcodone-acetaminophen (NORCO/VICODIN) 5-325 MG per tablet Take 1 tablet by mouth every 6 (six) hours as needed for moderate pain or severe pain. Qty: 10 tablet, Refills: 0    metoprolol tartrate (LOPRESSOR) 25 MG tablet Take 0.5 tablets (12.5 mg total) by mouth daily.      CONTINUE  these medications which have NOT CHANGED   Details  benazepril (LOTENSIN) 20 MG tablet Take 20 mg by mouth daily.    cholecalciferol (VITAMIN D) 1000 UNITS tablet Take 2,000 Units by mouth daily.     dabigatran (PRADAXA) 150 MG CAPS Take 150 mg by mouth every 12 (twelve) hours.    iron polysaccharides (FERREX 150) 150 MG capsule Take 150 mg by mouth daily.    PHENobarbital (LUMINAL) 97.2 MG tablet Take 97.2 mg by mouth at bedtime.     vitamin B-12 (CYANOCOBALAMIN) 500 MCG tablet Take 500 mcg by mouth daily.     vitamin E 400 UNIT capsule Take 400 Units by mouth daily.      STOP taking these medications     Misc. Devices (AIRGO ROLLING WALKER) MISC        No Known Allergies    The results of significant diagnostics from this hospitalization (including imaging, microbiology, ancillary and laboratory) are listed below for reference.    Significant Diagnostic Studies: X-ray Chest Pa And Lateral  03/18/2014   CLINICAL DATA:  Acute chills, cellulitis and bradycardia. Initial encounter.  EXAM: CHEST  2 VIEW  COMPARISON:  01/11/2014 and 09/26/2008 radiographs  FINDINGS: Cardiomegaly and fullness of the superior mediastinum are unchanged.  There is no evidence of focal airspace disease, pulmonary edema, suspicious pulmonary nodule/mass, pleural effusion, or pneumothorax. No acute bony abnormalities are identified.  IMPRESSION: Cardiomegaly without evidence of acute cardiopulmonary disease.   Electronically Signed   By: Laveda AbbeJeff  Hu M.D.   On: 03/18/2014 03:08   Dg Ankle Complete Left  03/19/2014   CLINICAL DATA:  Medial left ankle pain  EXAM: LEFT ANKLE COMPLETE - 3+ VIEW  COMPARISON:  05/17/2013  FINDINGS: Three views of left ankle submitted. Stable old fracture deformity of medial malleolus. No acute fracture or subluxation. Diffuse osteopenia. Ankle mortise is preserved. Small plantar spur of calcaneus. There is soft tissue swelling adjacent to medial malleolus. Linear soft tissue  calcification adjacent to medial malleolus suspicious for calcific tendinosis.  IMPRESSION: No acute fracture or subluxation. Stable old fracture deformity of medial malleolus. Soft tissue swelling adjacent to medial malleolus. Linear calcifications adjacent to medial malleolus suspicious for calcific tendinosis.   Electronically Signed   By: Natasha MeadLiviu  Pop M.D.   On: 03/19/2014 09:38   Ct Head Wo Contrast  03/18/2014   CLINICAL DATA:  Ataxia.  EXAM: CT HEAD WITHOUT CONTRAST  TECHNIQUE: Contiguous axial images were obtained from the base of the skull through the vertex without intravenous contrast.  COMPARISON:  05/30/2011  FINDINGS: There is no evidence of intracranial hemorrhage, brain edema, or other signs of acute infarction. There is no evidence of intracranial mass lesion or mass effect. No abnormal extraaxial fluid collections are identified.  Mild diffuse cerebral atrophy is again demonstrated. Moderate chronic small vessel disease is unchanged in appearance. Ventricles are stable in size. No skull abnormality identified.  IMPRESSION: No acute intracranial findings.  Stable cerebral atrophy and chronic small vessel disease.   Electronically Signed   By: Myles RosenthalJohn  Stahl M.D.   On:  03/18/2014 18:39   US Renal  03/18/2014   CLINICAL DATA:  Bibasilar crackles, hematuria, hypertension, acute renal failure  EXAM: RENAL/URINARY TRACT ULTRASOUND COMPLETE  COMPARISON:  Abdomen ultrasound 09/29/2008  FINDINGS: Right Kidney:  Length: 11.5 cm. Normal cortical thickness. Increased cortical echogenicity. No mass, hydronephrosis or shadowing calcification.  Left Kidney:  Length: 11.9 cm. Normal cortical thickness. Increased cortical echogenicity. Multiple cysts, largest at upper pole 2.6 x 2.5 x 3.7 cm. No solid mass, hydronephrosis or shadowing calcification.  Bladder:  Partially distended.  Bladder wall appears thickened.  Prostate gland:  Markedly enlarged 8.4 x 8.2 x 9.1 cm.  Incidentally noted cholelithiasis, mobile  calcified gallstone 2.6 cm diameter noted.  IMPRESSION: Medical renal disease changes of both kidneys without hydronephrosis.  LEFT renal cysts.  Markedly enlarged prostate gland with note of mild bladder wall thickening which could indicate muscular hypertrophy and chronic outlet obstruction.  Cholelithiasis, noted previously.   Electronically Signed   By: Ulyses Southward M.D.   On: 03/18/2014 11:10    Microbiology: Recent Results (from the past 240 hour(s))  Urine culture     Status: None   Collection Time: 03/17/14  7:46 PM  Result Value Ref Range Status   Specimen Description URINE, CATHETERIZED  Final   Special Requests NONE  Final   Culture  Setup Time   Final    03/18/2014 01:09 Performed at [facility]    Colony Count NO GROWTH Performed at [facility]   Final   Culture NO GROWTH Performed at [facility]   Final   Report Status 03/18/2014 FINAL  Final     Labs: Basic Metabolic Panel:  Recent Labs Lab 03/17/14 1635 03/18/14 0539  NA 139 141  K 4.2 4.1  CL 104 104  CO2 24 25  GLUCOSE 96 81  BUN 16 15  CREATININE 1.24 1.04  CALCIUM 9.0 9.4  MG  --  1.9  PHOS  --  3.2   Liver Function Tests:  Recent Labs Lab 03/18/14 0539  AST 12  ALT 7  ALKPHOS 87  BILITOT 0.5  PROT 6.5  ALBUMIN 3.5   No results for input(s): LIPASE, AMYLASE in the last 168 hours. No results for input(s): AMMONIA in the last 168 hours. CBC:  Recent Labs Lab 03/17/14 1635 03/18/14 0539  WBC 4.3 5.0  NEUTROABS 3.2  --   HGB 11.2* 11.9*  HCT 33.1* 35.5*  MCV 90.7 88.5  PLT 126* 132*   Cardiac Enzymes: No results for input(s): CKTOTAL, CKMB, CKMBINDEX, TROPONINI in the last 168 hours. BNP: BNP (last 3 results)  Recent Labs  01/11/14 0947  PROBNP 2860.0*   CBG:  Recent Labs Lab 03/19/14 1159 03/19/14 1254  GLUCAP 64* 83       Signed:  Timo Hartwig L  [facility] 03/22/2014, 2:44 PM

## 2014-03-22 NOTE — Clinical Social Work Note (Signed)
Clinical Social Worker facilitated patient discharge including contacting patient family and facility to confirm patient discharge plans.  Clinical information faxed to facility and family agreeable with plan.  CSW arranged [facility]  RN to call report prior to discharge.  Clinical Social Worker will sign off for now as social work intervention is no longer needed. Please consult us again if new need arises.  Derenda FennelBashira Sharol Croghan, MSW, LCSWA [phone] 03/22/2014 3:05 PM

## 2014-03-22 NOTE — Progress Notes (Signed)
Physical Therapy Treatment Patient Details Name: Donald RoseWilliam Reynolds MRN: [id number] DOB: 12/10/1925 Today's Date: 03/22/2014    History of Present Illness Donald Reynolds presents w/ past medical history of Hypertension; Coronary artery disease; Diarrhea; Hypokalemia; History of seizure disorder; Nausea & vomiting; Vitiligo; Acute renal failure; Deep venous thrombosis; Hiatal hernia (2003); Atrial fibrillation; CHF (congestive heart failure); Anemia; Arthritis; Heart murmur; and Seizures.      PT Comments    Patient progressing with mobility and tolerance to ambulation.  Will still benefit from SNF level rehab as still fall risk with turns and walking with walker unaided.  Follow Up Recommendations  SNF;Supervision/Assistance - 24 hour     Equipment Recommendations  Other (comment) (defer to SNF)    Recommendations for Other Services       Precautions / Restrictions Precautions Precautions: Fall Precaution Comments: hx of dementia, decreased WB on L foot    Mobility  Bed Mobility               General bed mobility comments: patient up in chair  Transfers Overall transfer level: Needs assistance Equipment used: Rolling walker (2 wheeled) Transfers: Sit to/from Stand Sit to Stand: Min assist         General transfer comment: initially with posterior bias after pushing up with UE's  Ambulation/Gait Ambulation/Gait assistance: Min assist Ambulation Distance (Feet): 400 Feet (and 200') Assistive device: Rolling walker (2 wheeled) Gait Pattern/deviations: Step-through pattern;Decreased stride length;Trunk flexed     General Gait Details: assist and cues for keeping walker close especially on turns, eager to walk and seems to feet much improvement in ankle over last session   Stairs            Wheelchair Mobility    Modified Rankin (Stroke Patients Only)       Balance Overall balance assessment: Needs assistance           Standing balance-Leahy Scale:  Poor Standing balance comment: needs UE support on walker for balance                    Cognition Arousal/Alertness: Awake/alert Behavior During Therapy: WFL for tasks assessed/performed Overall Cognitive Status: History of cognitive impairments - at baseline                      Exercises General Exercises - Lower Extremity Ankle Circles/Pumps: AROM;Both;10 reps;Seated Long Arc Quad: AROM;Both;10 reps;Seated Hip Flexion/Marching: AROM;Both;10 reps;Seated    General Comments        Pertinent Vitals/Pain Pain Assessment: No/denies pain    Home Living                      Prior Function            PT Goals (current goals can now be found in the care plan section) Progress towards PT goals: Progressing toward goals    Frequency  Min 2X/week    PT Plan Current plan remains appropriate    Co-evaluation             End of Session Equipment Utilized During Treatment: Gait belt Activity Tolerance: Patient tolerated treatment well Patient left: in chair;with call bell/phone within reach;with nursing/sitter in room     Time: 1445-1517 PT Time Calculation (min) (ACUTE ONLY): 32 min  Charges:  $Gait Training: 23-37 mins                    G Codes:  WYNN,CYNDI 03/22/2014, 7:56 PM Sheran Lawlessyndi Wynn, PT [phone] 03/22/2014

## 2014-03-22 NOTE — Clinical Social Work Note (Signed)
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 Latanya MaudlinLasandra reported beginning insurance authorization and CSW will await a returned phone call for insurance approval [facility].   CSW continues to follow pt and pt's family.   Derenda FennelBashira Stoney Karczewski, MSW, LCSWA [phone] 03/22/2014 12:49 PM

## 2014-03-22 NOTE — Clinical Social Work Note (Signed)
Clinical Social Worker spoke with pt's sister, Thurston Holenne via telephone to review bed offers. Pt's sister reported pt has been a resident of [place] in the past, however she would [facility]. CSW notified Alvino Chapelllen, [facility] RN Liaison of bed acceptance. CSW also attempted to contact [employer] [facility]([place]ae) and left a message.  CSW will continue to follow pt and facilitate pt's dc needs.  Derenda FennelBashira Merilynn Haydu, MSW, LCSWA [phone] 03/22/2014 12:02 PM

## 2014-05-25 NOTE — Telephone Encounter (Signed)
error 

## 2014-08-05 ENCOUNTER — Ambulatory Visit (INDEPENDENT_AMBULATORY_CARE_PROVIDER_SITE_OTHER): Payer: Medicare HMO | Admitting: Internal Medicine

## 2014-08-05 ENCOUNTER — Encounter: Payer: Self-pay | Admitting: Internal Medicine

## 2014-08-05 VITALS — BP 110/60 | HR 53 | Ht 72.0 in | Wt 183.2 lb

## 2014-08-05 DIAGNOSIS — I482 Chronic atrial fibrillation: Secondary | ICD-10-CM | POA: Diagnosis not present

## 2014-08-05 DIAGNOSIS — I4821 Permanent atrial fibrillation: Secondary | ICD-10-CM

## 2014-08-05 MED ORDER — BENAZEPRIL HCL 10 MG PO TABS: 10.0000 mg | ORAL_TABLET | Freq: Every day | ORAL | Status: DC

## 2014-08-05 MED ORDER — HYDROCHLOROTHIAZIDE 25 MG PO TABS: 25.0000 mg | ORAL_TABLET | Freq: Every day | ORAL | Status: DC

## 2014-08-05 MED ORDER — DABIGATRAN ETEXILATE MESYLATE 150 MG PO CAPS: 150.0000 mg | ORAL_CAPSULE | Freq: Two times a day (BID) | ORAL | Status: AC

## 2014-08-05 NOTE — Addendum Note (Signed)
Addended by: Baird LyonsPRICE, Jacilyn Sanpedro L on: 08/05/2014 01:19 PM   Modules accepted: Orders, Medications, Level of Service

## 2014-08-05 NOTE — Progress Notes (Signed)
      Patient Care Team: Reggie Pileakela N. Dareen PianoAnderson, FNP as PCP - General (Nurse Practitioner)   HPI  Donald Reynolds is a 81 y.o. male Seen in followup for atrial fibrillation and hypertension. He has a CHADS-VASc score of 4 and was started last year on Pradaxa  The patient denies chest pain, shortness of breath, nocturnal dyspnea, orthopnea   He is not having significant sodium intake     Past Medical History  Diagnosis Date  . Hypertension   . Coronary artery disease   . Diarrhea     secondary to Salmonella species  . Hypokalemia   . History of seizure disorder   . Nausea & vomiting   . Vitiligo   . Acute renal failure   . Deep venous thrombosis     Deep venous thrombosis prophylaxis  . Hiatal hernia 2003  . Atrial fibrillation   . CHF (congestive heart failure)   . Anemia   . Arthritis   . Heart murmur   . Seizures     Past Surgical History  Procedure Laterality Date  . Hernia repair      Current Outpatient Prescriptions  Medication Sig Dispense Refill  . benazepril (LOTENSIN) 20 MG tablet Take 20 mg by mouth daily.    . cholecalciferol (VITAMIN D) 1000 UNITS tablet Take 2,000 Units by mouth daily.     . dabigatran (PRADAXA) 150 MG CAPS Take 150 mg by mouth every 12 (twelve) hours.    . hydrochlorothiazide (HYDRODIURIL) 25 MG tablet Take 25 mg by mouth daily.  0  . iron polysaccharides (FERREX 150) 150 MG capsule Take 150 mg by mouth daily.    . metoprolol tartrate (LOPRESSOR) 25 MG tablet Take 0.5 tablets (12.5 mg total) by mouth daily.    Marland Kitchen. PHENobarbital (LUMINAL) 97.2 MG tablet Take 97.2 mg by mouth at bedtime.     . vitamin E 400 UNIT capsule Take 400 Units by mouth daily.     No current facility-administered medications for this visit.    No Known Allergies  Review of Systems negative except from HPI and PMH  Physical Exam BP 110/60 mmHg  Pulse 53  Ht 6' (1.829 m)  Wt 183 lb 3.2 oz (83.099 kg)  BMI 24.84 kg/m2 Well developed and well nourished  in no acute distress HENT normal E scleral and icterus clear Neck Supple JVP flat; carotids brisk and full Clear to ausculation Irregularly irregular rate and rhythm, no murmurs gallops or rub Soft with active bowel sounds No clubbing cyanosis no  Edema Alert and oriented, grossly normal motor and sensory function Skin Warm and Dry  ECG demonstrates atrial fibrillation at 53 Intervals-/09/44  axis60  Assessment and  Plan Atrial fibrillation   Hypertension  Seizure disorder  Bradycardia   Blood pressure is much better controlled. There is no more edema. This is resolved with the HCTZ.  We will decrease his been Astepro from 20--10.  He is tolerating dabigatran without bleeding complications. Last renal function assessment 12/15 was normal  With his atrial fibrillation bradycardia, we will discontinue his metoprolol.  I've asked him to follow-up with his primary care physician in about 2 months for reassessment of blood pressure. If he remained well controlled, we may be of the stop his benazepril.

## 2014-08-05 NOTE — Patient Instructions (Signed)
Medication Instructions:  Your physician has recommended you make the following change in your medication:  1) DECREASE Lotensin to 10 mg daily 2) STOP Metoprolol   Labwork: None  Testing/Procedures: None  Follow-Up: Your physician wants you to follow-up in: 1 year with Dr. Graciela HusbandsKlein.  You will receive a reminder letter in the mail two months in advance. If you don't receive a letter, please call our office to schedule the follow-up appointment.   Any Other Special Instructions Will Be Listed Below (If Applicable). Follow up with your primary care provider in 2 months to reassess blood pressure control

## 2014-08-10 ENCOUNTER — Telehealth: Payer: Self-pay | Admitting: Internal Medicine

## 2014-08-10 NOTE — Telephone Encounter (Signed)
New problem   Pt's sister stated pt need a prescription for Hydrochlorothiazide 25mg  and [facility] [address] You can leave a detail message is she isn't at home.

## 2014-08-10 NOTE — Telephone Encounter (Signed)
Informed patient's sister medication refill for HCTZ was sent in on 4/21. Called pharmacy and confirmed they received. She will go by today and pick up rx.

## 2014-08-25 ENCOUNTER — Emergency Department (HOSPITAL_COMMUNITY)
Admission: EM | Admit: 2014-08-25 | Discharge: 2014-08-25 | Disposition: A | Discharge status: Home / Self Care | Payer: Medicare HMO | Attending: Emergency Medicine | Admitting: Emergency Medicine

## 2014-08-25 ENCOUNTER — Encounter (HOSPITAL_COMMUNITY): Payer: Self-pay | Admitting: Emergency Medicine

## 2014-08-25 DIAGNOSIS — I2510 Atherosclerotic heart disease of native coronary artery without angina pectoris: Secondary | ICD-10-CM | POA: Insufficient documentation

## 2014-08-25 DIAGNOSIS — Z872 Personal history of diseases of the skin and subcutaneous tissue: Secondary | ICD-10-CM | POA: Insufficient documentation

## 2014-08-25 DIAGNOSIS — Z86718 Personal history of other venous thrombosis and embolism: Secondary | ICD-10-CM | POA: Diagnosis not present

## 2014-08-25 DIAGNOSIS — I10 Essential (primary) hypertension: Secondary | ICD-10-CM | POA: Diagnosis not present

## 2014-08-25 DIAGNOSIS — Z87891 Personal history of nicotine dependence: Secondary | ICD-10-CM | POA: Diagnosis not present

## 2014-08-25 DIAGNOSIS — Z8719 Personal history of other diseases of the digestive system: Secondary | ICD-10-CM | POA: Diagnosis not present

## 2014-08-25 DIAGNOSIS — D649 Anemia, unspecified: Secondary | ICD-10-CM | POA: Diagnosis not present

## 2014-08-25 DIAGNOSIS — Z8639 Personal history of other endocrine, nutritional and metabolic disease: Secondary | ICD-10-CM | POA: Diagnosis not present

## 2014-08-25 DIAGNOSIS — I509 Heart failure, unspecified: Secondary | ICD-10-CM | POA: Diagnosis not present

## 2014-08-25 DIAGNOSIS — Z8739 Personal history of other diseases of the musculoskeletal system and connective tissue: Secondary | ICD-10-CM | POA: Diagnosis not present

## 2014-08-25 DIAGNOSIS — Z87448 Personal history of other diseases of urinary system: Secondary | ICD-10-CM | POA: Insufficient documentation

## 2014-08-25 DIAGNOSIS — Z7902 Long term (current) use of antithrombotics/antiplatelets: Secondary | ICD-10-CM | POA: Diagnosis not present

## 2014-08-25 DIAGNOSIS — Z79899 Other long term (current) drug therapy: Secondary | ICD-10-CM | POA: Diagnosis not present

## 2014-08-25 DIAGNOSIS — G40909 Epilepsy, unspecified, not intractable, without status epilepticus: Secondary | ICD-10-CM | POA: Diagnosis not present

## 2014-08-25 DIAGNOSIS — R011 Cardiac murmur, unspecified: Secondary | ICD-10-CM | POA: Diagnosis not present

## 2014-08-25 DIAGNOSIS — R350 Frequency of micturition: Secondary | ICD-10-CM | POA: Insufficient documentation

## 2014-08-25 LAB — URINE MICROSCOPIC-ADD ON

## 2014-08-25 LAB — URINALYSIS, ROUTINE W REFLEX MICROSCOPIC
Bilirubin Urine: NEGATIVE
Glucose, UA: NEGATIVE mg/dL
Ketones, ur: NEGATIVE mg/dL
Leukocytes, UA: NEGATIVE
NITRITE: NEGATIVE
Protein, ur: NEGATIVE mg/dL
SPECIFIC GRAVITY, URINE: 1.002 — AB (ref 1.005–1.030)
UROBILINOGEN UA: 0.2 mg/dL (ref 0.0–1.0)
pH: 6.5 (ref 5.0–8.0)

## 2014-08-25 LAB — BASIC METABOLIC PANEL
ANION GAP: 9 (ref 5–15)
BUN: 19 mg/dL (ref 6–20)
CALCIUM: 9 mg/dL (ref 8.9–10.3)
CO2: 24 mmol/L (ref 22–32)
Chloride: 101 mmol/L (ref 101–111)
Creatinine, Ser: 1.21 mg/dL (ref 0.61–1.24)
GFR calc Af Amer: >60 mL/min (ref >60)
GFR calc non Af Amer: 52 mL/min — ABNORMAL LOW (ref >60)
GLUCOSE: 79 mg/dL (ref 70–99)
POTASSIUM: 3.6 mmol/L (ref 3.5–5.1)
SODIUM: 134 mmol/L — AB (ref 135–145)

## 2014-08-25 NOTE — Discharge Instructions (Signed)
Return to the emergency room with worsening of symptoms, new symptoms or with symptoms that are concerning, especially unable to urinate, nausea, vomiting, abdominal or back pain, blood in urine. Cut your HCTZ medication in half for the next week. Please call your doctor for a followup appointment within 24-48 hours. When you talk to your doctor please let them know that you were seen in the emergency department and have them acquire all of your records so that they can discuss the findings with you and formulate a treatment plan to fully care for your new and ongoing problems.  Read below information and follow recommendations. Urinary Frequency The number of times a normal person urinates depends upon how much liquid they take in and how much liquid they are losing. If the temperature is hot and there is high humidity, then the person will sweat more and usually breathe a little more frequently. These factors decrease the amount of frequency of urination that would be considered normal. The amount you drink is easily determined, but the amount of fluid lost is sometimes more difficult to calculate.  Fluid is lost in two ways:  Sensible fluid loss is usually measured by the amount of urine that you get rid of. Losses of fluid can also occur with diarrhea.  Insensible fluid loss is more difficult to measure. It is caused by evaporation. Insensible loss of fluid occurs through breathing and sweating. It usually ranges from a little less than a quart to a little more than a quart of fluid a day. In normal temperatures and activity levels, the average person may urinate 4 to 7 times in a 24-hour period. Needing to urinate more often than that could indicate a problem. If one urinates 4 to 7 times in 24 hours and has large volumes each time, that could indicate a different problem from one who urinates 4 to 7 times a day and has small volumes. The time of urinating is also important. Most urinating should be  done during the waking hours. Getting up at night to urinate frequently can indicate some problems. CAUSES  The bladder is the organ in your lower abdomen that holds urine. Like a balloon, it swells some as it fills up. Your nerves sense this and tell you it is time to head for the bathroom. There are a number of reasons that you might feel the need to urinate more often than usual. They include:  Urinary tract infection. This is usually associated with other signs such as burning when you urinate.  In men, problems with the prostate (a walnut-size gland that is located near the tube that carries urine out of your body). There are two reasons why the prostate can cause an increased frequency of urination:  An enlarged prostate that does not let the bladder empty well. If the bladder only half empties when you urinate, then it only has half the capacity to fill before you have to urinate again.  The nerves in the bladder become more hypersensitive with an increased size of the prostate even if the bladder empties completely.  Pregnancy.  Obesity. Excess weight is more likely to cause a problem for women than for men.  Bladder stones or other bladder problems.  Caffeine.  Alcohol.  Medications. For example, drugs that help the body get rid of extra fluid (diuretics) increase urine production. Some other medicines must be taken with lots of fluids.  Muscle or nerve weakness. This might be the result of a spinal cord  injury, a stroke, multiple sclerosis, or Parkinson disease.  Long-standing diabetes can decrease the sensation of the bladder. This loss of sensation makes it harder to sense the bladder needs to be emptied. Over a period of years, the bladder is stretched out by constant overfilling. This weakens the bladder muscles so that the bladder does not empty well and has less capacity to fill with new urine.  Interstitial cystitis (also called painful bladder syndrome). This condition  develops because the tissues that line the inside of the bladder are inflamed (inflammation is the body's way of reacting to injury or infection). It causes pain and frequent urination. It occurs in women more often than in men. DIAGNOSIS   To decide what might be causing your urinary frequency, your health care provider will probably:  Ask about symptoms you have noticed.  Ask about your overall health. This will include questions about any medications you are taking.  Do a physical examination.  Order some tests. These might include:  A blood test to check for diabetes or other health issues that could be contributing to the problem.  Urine testing. This could measure the flow of urine and the pressure on the bladder.  A test of your neurological system (the brain, spinal cord, and nerves). This is the system that senses the need to urinate.  A bladder test to check whether it is emptying completely when you urinate.  Cystoscopy. This test uses a thin tube with a tiny camera on it. It offers a look inside your urethra and bladder to see if there are problems.  Imaging tests. You might be given a contrast dye and then asked to urinate. X-rays are taken to see how your bladder is working. TREATMENT  It is important for you to be evaluated to determine if the amount or frequency that you have is unusual or abnormal. If it is found to be abnormal, the cause should be determined and this can usually be found out easily. Depending upon the cause, treatment could include medication, stimulation of the nerves, or surgery. There are not too many things that you can do as an individual to change your urinary frequency. It is important that you balance the amount of fluid intake needed to compensate for your activity and the temperature. Medical problems will be diagnosed and taken care of by your physician. There is no particular bladder training such as Kegel exercises that you can do to help  urinary frequency. This is an exercise that is usually recommended for people who have leaking of urine when they laugh, cough, or sneeze. HOME CARE INSTRUCTIONS   Take any medications your health care provider prescribed or suggested. Follow the directions carefully.  Practice any lifestyle changes that are recommended. These might include:  Drinking less fluid or drinking at different times of the day. If you need to urinate often during the night, for example, you may need to stop drinking fluids early in the evening.  Cutting down on caffeine or alcohol. They both can make you need to urinate more often than normal. Caffeine is found in coffee, tea, and sodas.  Losing weight, if that is recommended.  Keep a journal or a log. You might be asked to record how much you drink and when and where you feel the need to urinate. This will also help evaluate how well the treatment provided by your physician is working. SEEK MEDICAL CARE IF:   Your need to urinate often gets worse.  You  feel increased pain or irritation when you urinate.  You notice blood in your urine.  You have questions about any medications that your health care provider recommended.  You notice blood, pus, or swelling at the site of any test or treatment procedure.  You develop a fever of more than 100.49F (38.1C). SEEK IMMEDIATE MEDICAL CARE IF:  You develop a fever of more than 102.74F (38.9C). Document Released: 01/27/2009 Document Revised: 08/17/2013 Document Reviewed: 01/27/2009 [facility] Patient Information 2015 [place], [place]. This information is not intended to replace advice given to you by your health care provider. Make sure you discuss any questions you have with your health care provider.

## 2014-08-25 NOTE — ED Notes (Signed)
Family reported pt.'s intermittent urinary frequency onset 2 weeks ago , pt. denies dysuria , no fever or chills.

## 2014-08-25 NOTE — ED Provider Notes (Signed)
CSN: [id number]     Arrival date & time 08/25/14  [id number] History   First MD Initiated Contact with Patient 08/25/14 [phone]     Chief Complaint  Patient presents with  . Urinary Frequency     (Consider location/radiation/quality/duration/timing/severity/associated sxs/prior Treatment) HPI  Donald Reynolds is a 81 y.o. male with PMH of atrial fibrillation on pradaxa, CHF, HTN, CAD, ARF, seizures presenting with 2 weeks of urinary frequency. Wife who is bedside states he goes frequently at night especially and every 30 mins-1 hour daily. No dysuria, hematuria, hesitancy. No fevers chills, nausea, vomiting, abdominal or back pain. Pt seen Dr. Annabell HowellsWrenn with urology in March who prescribed pt medication for "dribbling" which he takes intermittently and has not taken recently. Wife unsure of the medication. Pt denies any pain. Pt recently started on HCTZ.    Past Medical History  Diagnosis Date  . Hypertension   . Coronary artery disease   . Diarrhea     secondary to Salmonella species  . Hypokalemia   . History of seizure disorder   . Nausea & vomiting   . Vitiligo   . Acute renal failure   . Deep venous thrombosis     Deep venous thrombosis prophylaxis  . Hiatal hernia 2003  . Atrial fibrillation   . CHF (congestive heart failure)   . Anemia   . Arthritis   . Heart murmur   . Seizures    Past Surgical History  Procedure Laterality Date  . Hernia repair     Family History  Problem Relation Age of Onset  . Stroke Mother   . Hyperlipidemia Mother   . Hypertension Mother   . Heart attack Father   . Kidney Stones Father   . Heart disease Father   . Colon cancer Neg Hx   . Diabetes Other     nephew  . Gout Brother   . Heart disease Brother    History  Substance Use Topics  . Smoking status: Former Games developermoker  . Smokeless tobacco: Never Used  . Alcohol Use: No    Review of Systems 10 Systems reviewed and are negative for acute change except as noted in the  HPI.    Allergies  Review of patient's allergies indicates no known allergies.  Home Medications   Prior to Admission medications   Medication Sig Start Date End Date Taking? Authorizing Provider  benazepril (LOTENSIN) 10 MG tablet Take 1 tablet (10 mg total) by mouth daily. 08/05/14  Yes Duke SalviaSteven C Klein, MD  Chlorphen-Pseudoephed-APAP (CORICIDIN D PO) Take 1 tablet by mouth daily.   Yes Historical Provider, MD  cyanocobalamin 500 MCG tablet Take 500 mcg by mouth daily.   Yes Historical Provider, MD  dabigatran (PRADAXA) 150 MG CAPS capsule Take 1 capsule (150 mg total) by mouth every 12 (twelve) hours. 08/05/14  Yes Duke SalviaSteven C Klein, MD  hydrochlorothiazide (HYDRODIURIL) 25 MG tablet Take 1 tablet (25 mg total) by mouth daily. 08/05/14  Yes Duke SalviaSteven C Klein, MD  iron polysaccharides (FERREX 150) 150 MG capsule Take 150 mg by mouth daily.   Yes Historical Provider, MD  PHENobarbital (LUMINAL) 97.2 MG tablet Take 97.2 mg by mouth at bedtime.    Yes Historical Provider, MD   BP 150/89 mmHg  Pulse 58  Temp(Src) 97.8 F (36.6 C) (Oral)  Resp 16  Ht 6' (1.829 m)  Wt 183 lb (83.008 kg)  BMI 24.81 kg/m2  SpO2 97% Physical Exam  Constitutional: He appears well-developed and well-nourished. No  distress.  HENT:  Head: Normocephalic and atraumatic.  Mouth/Throat: Oropharynx is clear and moist.  Eyes: Conjunctivae and EOM are normal. Right eye exhibits no discharge. Left eye exhibits no discharge.  Cardiovascular: Normal rate and regular rhythm.   Pulmonary/Chest: Effort normal and breath sounds normal. No respiratory distress. He has no wheezes.  Abdominal: Soft. Bowel sounds are normal. He exhibits no distension. There is no tenderness.  No CVA tenderness  Genitourinary:  External rectum without tenderness to palpation without visible fissures. Internal rectum with normal tone without masses or lesions. Prostate exam diffusely enlarged without discrete lesions. Nursing tech in room during exam.    Neurological: He is alert. He exhibits normal muscle tone. Coordination normal.  Skin: Skin is warm and dry. He is not diaphoretic.  Nursing note and vitals reviewed.   ED Course  Procedures (including critical care time) Labs Review Labs Reviewed  URINALYSIS, ROUTINE W REFLEX MICROSCOPIC - Abnormal; Notable for the following:    Specific Gravity, Urine 1.002 (*)    Hgb urine dipstick TRACE (*)    All other components within normal limits  BASIC METABOLIC PANEL - Abnormal; Notable for the following:    Sodium 134 (*)    GFR calc non Af Amer 52 (*)    All other components within normal limits  URINE MICROSCOPIC-ADD ON    Imaging Review No results found.   EKG Interpretation None      MDM   Final diagnoses:  Urinary frequency   Pt presenting with urinary frequency. VSS. Prostate diffusely enlarged. No abdominal tenderness. Pt glucose and urine WNL. Bladder scan with 300ml urine. Urinary frequency likely due to BPH/recent addition of HCTZ. Pt to decreased HCTZ dose in half and follow up with urology.  Discussed return precautions with patient. Discussed all results and patient verbalizes understanding and agrees with plan.  This is a shared patient. This patient was discussed with the physician who saw and evaluated the patient and agrees with the plan.     Oswaldo ConroyVictoria Birtha Hatler, PA-C 08/27/14 [id number]  Tilden FossaElizabeth Rees, MD 08/27/14 [phone]

## 2014-09-06 ENCOUNTER — Encounter (HOSPITAL_COMMUNITY): Payer: Self-pay | Admitting: Emergency Medicine

## 2014-09-06 ENCOUNTER — Emergency Department (HOSPITAL_COMMUNITY)
Admission: EM | Admit: 2014-09-06 | Discharge: 2014-09-06 | Disposition: A | Discharge status: Home / Self Care | Payer: Medicare HMO | Attending: Emergency Medicine | Admitting: Emergency Medicine

## 2014-09-06 DIAGNOSIS — Z8739 Personal history of other diseases of the musculoskeletal system and connective tissue: Secondary | ICD-10-CM | POA: Diagnosis not present

## 2014-09-06 DIAGNOSIS — K5900 Constipation, unspecified: Secondary | ICD-10-CM | POA: Diagnosis present

## 2014-09-06 DIAGNOSIS — R011 Cardiac murmur, unspecified: Secondary | ICD-10-CM | POA: Diagnosis not present

## 2014-09-06 DIAGNOSIS — I4891 Unspecified atrial fibrillation: Secondary | ICD-10-CM | POA: Insufficient documentation

## 2014-09-06 DIAGNOSIS — Z79899 Other long term (current) drug therapy: Secondary | ICD-10-CM | POA: Insufficient documentation

## 2014-09-06 DIAGNOSIS — Z86718 Personal history of other venous thrombosis and embolism: Secondary | ICD-10-CM | POA: Insufficient documentation

## 2014-09-06 DIAGNOSIS — Z8619 Personal history of other infectious and parasitic diseases: Secondary | ICD-10-CM | POA: Diagnosis not present

## 2014-09-06 DIAGNOSIS — I509 Heart failure, unspecified: Secondary | ICD-10-CM | POA: Diagnosis not present

## 2014-09-06 DIAGNOSIS — G40909 Epilepsy, unspecified, not intractable, without status epilepticus: Secondary | ICD-10-CM | POA: Insufficient documentation

## 2014-09-06 DIAGNOSIS — I2510 Atherosclerotic heart disease of native coronary artery without angina pectoris: Secondary | ICD-10-CM | POA: Diagnosis not present

## 2014-09-06 DIAGNOSIS — Z87891 Personal history of nicotine dependence: Secondary | ICD-10-CM | POA: Insufficient documentation

## 2014-09-06 DIAGNOSIS — D649 Anemia, unspecified: Secondary | ICD-10-CM | POA: Insufficient documentation

## 2014-09-06 DIAGNOSIS — Z87448 Personal history of other diseases of urinary system: Secondary | ICD-10-CM | POA: Diagnosis not present

## 2014-09-06 LAB — COMPREHENSIVE METABOLIC PANEL
ALBUMIN: 3.5 g/dL (ref 3.5–5.0)
ALT: 12 U/L — ABNORMAL LOW (ref 17–63)
AST: 18 U/L (ref 15–41)
Alkaline Phosphatase: 57 U/L (ref 38–126)
Anion gap: 6 (ref 5–15)
BUN: 15 mg/dL (ref 6–20)
CALCIUM: 9 mg/dL (ref 8.9–10.3)
CHLORIDE: 108 mmol/L (ref 101–111)
CO2: 25 mmol/L (ref 22–32)
Creatinine, Ser: 1.17 mg/dL (ref 0.61–1.24)
GFR calc Af Amer: >60 mL/min (ref >60)
GFR calc non Af Amer: 55 mL/min — ABNORMAL LOW (ref >60)
Glucose, Bld: 95 mg/dL (ref 65–99)
Potassium: 3.4 mmol/L — ABNORMAL LOW (ref 3.5–5.1)
Sodium: 139 mmol/L (ref 135–145)
Total Bilirubin: 0.6 mg/dL (ref 0.3–1.2)
Total Protein: 5.9 g/dL — ABNORMAL LOW (ref 6.5–8.1)

## 2014-09-06 LAB — CBC WITH DIFFERENTIAL/PLATELET
Basophils Absolute: 0 10*3/uL (ref 0.0–0.1)
Basophils Relative: 1 % (ref 0–1)
EOS ABS: 0 10*3/uL (ref 0.0–0.7)
Eosinophils Relative: 0 % (ref 0–5)
HCT: 34 % — ABNORMAL LOW (ref 39.0–52.0)
Hemoglobin: 11.6 g/dL — ABNORMAL LOW (ref 13.0–17.0)
Lymphocytes Relative: 19 % (ref 12–46)
Lymphs Abs: 0.6 10*3/uL — ABNORMAL LOW (ref 0.7–4.0)
MCH: 30.4 pg (ref 26.0–34.0)
MCHC: 34.1 g/dL (ref 30.0–36.0)
MCV: 89.2 fL (ref 78.0–100.0)
MONO ABS: 0.3 10*3/uL (ref 0.1–1.0)
Monocytes Relative: 7 % (ref 3–12)
NEUTROS ABS: 2.5 10*3/uL (ref 1.7–7.7)
NEUTROS PCT: 73 % (ref 43–77)
Platelets: 141 10*3/uL — ABNORMAL LOW (ref 150–400)
RBC: 3.81 MIL/uL — ABNORMAL LOW (ref 4.22–5.81)
RDW: 14.5 % (ref 11.5–15.5)
WBC: 3.4 10*3/uL — AB (ref 4.0–10.5)

## 2014-09-06 LAB — SAMPLE TO BLOOD BANK

## 2014-09-06 LAB — APTT: APTT: 47 s — AB (ref 24–37)

## 2014-09-06 LAB — PROTIME-INR
INR: 1.29 (ref 0.00–1.49)
Prothrombin Time: 16.3 seconds — ABNORMAL HIGH (ref 11.6–15.2)

## 2014-09-06 LAB — POC OCCULT BLOOD, ED: FECAL OCCULT BLD: POSITIVE — AB

## 2014-09-06 MED ORDER — DOCUSATE SODIUM 100 MG PO CAPS: 100.0000 mg | ORAL_CAPSULE | Freq: Two times a day (BID) | ORAL | Status: DC

## 2014-09-06 NOTE — ED Notes (Signed)
Pt arrives with constipation x3 days. Seen by PCP for diarrhea and was given meds, hasn't had a bowel movement since

## 2014-09-06 NOTE — ED Provider Notes (Signed)
TIME SEEN: 4:50 AM  CHIEF COMPLAINT: Constipation  HPI: Pt is 81 y.o. M with history of CAD, hypertension, A. fib on Pradaxa, CHF who presents emergency department with constipation. Reports that previously in the week he was having diarrhea and was given medication by his primary care provider for diarrhea but cannot recall the name of this medication. States that since taking that medicine he has not had a bowel movement in 3 days. Denies abdominal pain. Denies nausea or vomiting. No bloody stools or melena. States he always has dark stools because he is on iron. Has not tried anything at home for his constipation. Is no longer taking the medication provided by his primary care provider for diarrhea. Sister reports he only took one dose.  ROS: See HPI Constitutional: no fever  Eyes: no drainage  ENT: no runny nose   Cardiovascular:  no chest pain  Resp: no SOB  GI: no vomiting GU: no dysuria Integumentary: no rash  Allergy: no hives  Musculoskeletal: no leg swelling  Neurological: no slurred speech ROS otherwise negative  PAST MEDICAL HISTORY/PAST SURGICAL HISTORY:  Past Medical History  Diagnosis Date  . Hypertension   . Coronary artery disease   . Diarrhea     secondary to Salmonella species  . Hypokalemia   . History of seizure disorder   . Nausea & vomiting   . Vitiligo   . Acute renal failure   . Deep venous thrombosis     Deep venous thrombosis prophylaxis  . Hiatal hernia 2003  . Atrial fibrillation   . CHF (congestive heart failure)   . Anemia   . Arthritis   . Heart murmur   . Seizures     MEDICATIONS:  Prior to Admission medications   Medication Sig Start Date End Date Taking? Authorizing Provider  benazepril (LOTENSIN) 10 MG tablet Take 1 tablet (10 mg total) by mouth daily. 08/05/14   Duke SalviaSteven C Klein, MD  Chlorphen-Pseudoephed-APAP (CORICIDIN D PO) Take 1 tablet by mouth daily.    Historical Provider, MD  cyanocobalamin 500 MCG tablet Take 500 mcg by mouth  daily.    Historical Provider, MD  dabigatran (PRADAXA) 150 MG CAPS capsule Take 1 capsule (150 mg total) by mouth every 12 (twelve) hours. 08/05/14   Duke SalviaSteven C Klein, MD  hydrochlorothiazide (HYDRODIURIL) 25 MG tablet Take 1 tablet (25 mg total) by mouth daily. 08/05/14   Duke SalviaSteven C Klein, MD  iron polysaccharides (FERREX 150) 150 MG capsule Take 150 mg by mouth daily.    Historical Provider, MD  PHENobarbital (LUMINAL) 97.2 MG tablet Take 97.2 mg by mouth at bedtime.     Historical Provider, MD    ALLERGIES:  No Known Allergies  SOCIAL HISTORY:  History  Substance Use Topics  . Smoking status: Former Games developermoker  . Smokeless tobacco: Never Used  . Alcohol Use: No    FAMILY HISTORY: Family History  Problem Relation Age of Onset  . Stroke Mother   . Hyperlipidemia Mother   . Hypertension Mother   . Heart attack Father   . Kidney Stones Father   . Heart disease Father   . Colon cancer Neg Hx   . Diabetes Other     nephew  . Gout Brother   . Heart disease Brother     EXAM: BP 148/83 mmHg  Pulse 89  Temp(Src) 98.5 F (36.9 C) (Oral)  Resp 18  SpO2 100% CONSTITUTIONAL: Alert and oriented and responds appropriately to questions. Well-appearing; well-nourished, elderly, in no  distress, smiling, pleasant, nontoxic HEAD: Normocephalic EYES: Conjunctivae clear, PERRL ENT: normal nose; no rhinorrhea; moist mucous membranes; pharynx without lesions noted NECK: Supple, no meningismus, no LAD  CARD: RRR; S1 and S2 appreciated; no murmurs, no clicks, no rubs, no gallops RESP: Normal chest excursion without splinting or tachypnea; breath sounds clear and equal bilaterally; no wheezes, no rhonchi, no rales, no hypoxia or respiratory distress, speaking full sentences ABD/GI: Normal bowel sounds; non-distended; soft, non-tender, no rebound, no guarding, no peritoneal signs RECTAL:  Patient has had a bowel movement in his briefs that is soft and dark but no melena or gross blood, minimally guaiac  positive, no fecal impaction, normal rectal tone BACK:  The back appears normal and is non-tender to palpation, there is no CVA tenderness EXT: Normal ROM in all joints; non-tender to palpation; no edema; normal capillary refill; no cyanosis, no calf tenderness or swelling    SKIN: Normal color for age and race; warm NEURO: Moves all extremities equally, sensation to light touch intact diffusely, cranial nerves II through XII intact PSYCH: The patient's mood and manner are appropriate. Grooming and personal hygiene are appropriate.  MEDICAL DECISION MAKING: Patient here with constipation but has had a bowel movement in the ED without intervention and has no fecal impaction. Abdominal exam benign. No distention, tympany. Passing gas. Doubt obstruction. He is minimally guaiac positive and has dark stools but not necessarily melanotic. He is on Ferrex and he reports his stools are chronically dark. He is on Pradaxa. Plan is to monitor patient in obtain basic labs. Anticipate if no significant change in vital signs or his baseline hemoglobin that he will be discharged home with outpatient follow-up.  ED PROGRESS: Patient's hemoglobin is baseline 11.6. Otherwise labs are unremarkable. He is still feeling well. I feel he is safe to be discharged. Sister is asking we discharge him with a prescription for his constipation. We'll give Colace 100 mg twice daily. Discussed return precautions. They verbalize understanding and are comfortable with plan.     Layla Maw Ward, DO 09/06/14 [phone]

## 2014-09-06 NOTE — ED Notes (Signed)
Pt having bowel movement at this time.

## 2014-09-06 NOTE — Discharge Instructions (Signed)
Constipation °Constipation is when a person has fewer than three bowel movements a week, has difficulty having a bowel movement, or has stools that are dry, hard, or larger than normal. As people grow older, constipation is more common. If you try to fix constipation with medicines that make you have a bowel movement (laxatives), the problem may get worse. Long-term laxative use may cause the muscles of the colon to become weak. A low-fiber diet, not taking in enough fluids, and taking certain medicines may make constipation worse.  °CAUSES  °· Certain medicines, such as antidepressants, pain medicine, iron supplements, antacids, and water pills.   °· Certain diseases, such as diabetes, irritable bowel syndrome (IBS), thyroid disease, or depression.   °· Not drinking enough water.   °· Not eating enough fiber-rich foods.   °· Stress or travel.   °· Lack of physical activity or exercise.   °· Ignoring the urge to have a bowel movement.   °· Using laxatives too much.   °SIGNS AND SYMPTOMS  °· Having fewer than three bowel movements a week.   °· Straining to have a bowel movement.   °· Having stools that are hard, dry, or larger than normal.   °· Feeling full or bloated.   °· Pain in the lower abdomen.   °· Not feeling relief after having a bowel movement.   °DIAGNOSIS  °Your health care provider will take a medical history and perform a physical exam. Further testing may be done for severe constipation. Some tests may include: °· A barium enema X-ray to examine your rectum, colon, and, sometimes, your small intestine.   °· A sigmoidoscopy to examine your lower colon.   °· A colonoscopy to examine your entire colon. °TREATMENT  °Treatment will depend on the severity of your constipation and what is causing it. Some dietary treatments include drinking more fluids and eating more fiber-rich foods. Lifestyle treatments may include regular exercise. If these diet and lifestyle recommendations do not help, your health care  provider may recommend taking over-the-counter laxative medicines to help you have bowel movements. Prescription medicines may be prescribed if over-the-counter medicines do not work.  °HOME CARE INSTRUCTIONS  °· Eat foods that have a lot of fiber, such as fruits, vegetables, whole grains, and beans. °· Limit foods high in fat and processed sugars, such as french fries, hamburgers, cookies, candies, and soda.   °· A fiber supplement may be added to your diet if you cannot get enough fiber from foods.   °· Drink enough fluids to keep your urine clear or pale yellow.   °· Exercise regularly or as directed by your health care provider.   °· Go to the restroom when you have the urge to go. Do not hold it.   °· Only take over-the-counter or prescription medicines as directed by your health care provider. Do not take other medicines for constipation without talking to your health care provider first.   °SEEK IMMEDIATE MEDICAL CARE IF:  °· You have bright red blood in your stool.   °· Your constipation lasts for more than 4 days or gets worse.   °· You have abdominal or rectal pain.   °· You have thin, pencil-like stools.   °· You have unexplained weight loss. °MAKE SURE YOU:  °· Understand these instructions. °· Will watch your condition. °· Will get help right away if you are not doing well or get worse. °Document Released: 12/30/2003 Document Revised: 04/07/2013 Document Reviewed: 01/12/2013 °ExitCare® Patient Information ©2015 [employer]. This information is not intended to replace advice given to you by your health care provider. Make sure you discuss any questions   you have with your health care provider. ° °High-Fiber Diet °Fiber is found in fruits, vegetables, and grains. A high-fiber diet encourages the addition of more whole grains, legumes, fruits, and vegetables in your diet. The recommended amount of fiber for adult males is 38 g per day. For adult females, it is 25 g per day. Pregnant and lactating women  should get 28 g of fiber per day. If you have a digestive or bowel problem, ask your caregiver for advice before adding high-fiber foods to your diet. Eat a variety of high-fiber foods instead of only a select few type of foods.  °PURPOSE °· To increase stool bulk. °· To make bowel movements more regular to prevent constipation. °· To lower cholesterol. °· To prevent overeating. °WHEN IS THIS DIET USED? °· It may be used if you have constipation and hemorrhoids. °· It may be used if you have uncomplicated diverticulosis (intestine condition) and irritable bowel syndrome. °· It may be used if you need help with weight management. °· It may be used if you want to add it to your diet as a protective measure against atherosclerosis, diabetes, and cancer. °SOURCES OF FIBER °· Whole-grain breads and cereals. °· Fruits, such as apples, oranges, bananas, berries, prunes, and pears. °· Vegetables, such as green peas, carrots, sweet potatoes, beets, broccoli, cabbage, spinach, and artichokes. °· Legumes, such split peas, soy, lentils. °· Almonds. °FIBER CONTENT IN FOODS °Starches and Grains / Dietary Fiber (g) °· Cheerios, 1 cup / 3 g °· Corn Flakes cereal, 1 cup / 0.7 g °· Rice crispy treat cereal, 1¼ cup / 0.3 g °· Instant oatmeal (cooked), ½ cup / 2 g °· Frosted wheat cereal, 1 cup / 5.1 g °· Brown, long-grain rice (cooked), 1 cup / 3.5 g °· White, long-grain rice (cooked), 1 cup / 0.6 g °· Enriched macaroni (cooked), 1 cup / 2.5 g °Legumes / Dietary Fiber (g) °· Baked beans (canned, plain, or vegetarian), ½ cup / 5.2 g °· Kidney beans (canned), ½ cup / 6.8 g °· Pinto beans (cooked), ½ cup / 5.5 g °Breads and Crackers / Dietary Fiber (g) °· Plain or honey graham crackers, 2 squares / 0.7 g °· Saltine crackers, 3 squares / 0.3 g °· Plain, salted pretzels, 10 pieces / 1.8 g °· Whole-wheat bread, 1 slice / 1.9 g °· White bread, 1 slice / 0.7 g °· Raisin bread, 1 slice / 1.2 g °· Plain bagel, 3 oz / 2 g °· Flour tortilla, 1 oz  / 0.9 g °· Corn tortilla, 1 small / 1.5 g °· Hamburger or hotdog bun, 1 small / 0.9 g °Fruits / Dietary Fiber (g) °· Apple with skin, 1 medium / 4.4 g °· Sweetened applesauce, ½ cup / 1.5 g °· Banana, ½ medium / 1.5 g °· Grapes, 10 grapes / 0.4 g °· Orange, 1 small / 2.3 g °· Raisin, 1.5 oz / 1.6 g °· Melon, 1 cup / 1.4 g °Vegetables / Dietary Fiber (g) °· Green beans (canned), ½ cup / 1.3 g °· Carrots (cooked), ½ cup / 2.3 g °· Broccoli (cooked), ½ cup / 2.8 g °· Peas (cooked), ½ cup / 4.4 g °· Mashed potatoes, ½ cup / 1.6 g °· Lettuce, 1 cup / 0.5 g °· Corn (canned), ½ cup / 1.6 g °· Tomato, ½ cup / 1.1 g °Document Released: 04/02/2005 Document Revised: 10/02/2011 Document Reviewed: 07/05/2011 °ExitCare® Patient Information ©2015 [employer]. This information is not intended to replace   advice given to you by your health care provider. Make sure you discuss any questions you have with your health care provider. ° °

## 2014-09-09 ENCOUNTER — Emergency Department (HOSPITAL_COMMUNITY): Payer: Medicare HMO

## 2014-09-09 ENCOUNTER — Emergency Department (HOSPITAL_COMMUNITY)
Admission: EM | Admit: 2014-09-09 | Discharge: 2014-09-09 | Disposition: A | Discharge status: Home / Self Care | Payer: Medicare HMO | Attending: Emergency Medicine | Admitting: Emergency Medicine

## 2014-09-09 DIAGNOSIS — L03116 Cellulitis of left lower limb: Secondary | ICD-10-CM | POA: Diagnosis not present

## 2014-09-09 DIAGNOSIS — I509 Heart failure, unspecified: Secondary | ICD-10-CM | POA: Insufficient documentation

## 2014-09-09 DIAGNOSIS — Z792 Long term (current) use of antibiotics: Secondary | ICD-10-CM | POA: Insufficient documentation

## 2014-09-09 DIAGNOSIS — Z87891 Personal history of nicotine dependence: Secondary | ICD-10-CM | POA: Diagnosis not present

## 2014-09-09 DIAGNOSIS — I10 Essential (primary) hypertension: Secondary | ICD-10-CM | POA: Diagnosis not present

## 2014-09-09 DIAGNOSIS — Z86718 Personal history of other venous thrombosis and embolism: Secondary | ICD-10-CM | POA: Diagnosis not present

## 2014-09-09 DIAGNOSIS — M1990 Unspecified osteoarthritis, unspecified site: Secondary | ICD-10-CM | POA: Diagnosis not present

## 2014-09-09 DIAGNOSIS — Z87448 Personal history of other diseases of urinary system: Secondary | ICD-10-CM | POA: Insufficient documentation

## 2014-09-09 DIAGNOSIS — D649 Anemia, unspecified: Secondary | ICD-10-CM | POA: Diagnosis not present

## 2014-09-09 DIAGNOSIS — R011 Cardiac murmur, unspecified: Secondary | ICD-10-CM | POA: Insufficient documentation

## 2014-09-09 DIAGNOSIS — G40909 Epilepsy, unspecified, not intractable, without status epilepticus: Secondary | ICD-10-CM | POA: Diagnosis not present

## 2014-09-09 DIAGNOSIS — I2510 Atherosclerotic heart disease of native coronary artery without angina pectoris: Secondary | ICD-10-CM | POA: Insufficient documentation

## 2014-09-09 DIAGNOSIS — I4891 Unspecified atrial fibrillation: Secondary | ICD-10-CM | POA: Diagnosis not present

## 2014-09-09 DIAGNOSIS — Z79899 Other long term (current) drug therapy: Secondary | ICD-10-CM | POA: Diagnosis not present

## 2014-09-09 DIAGNOSIS — Z7902 Long term (current) use of antithrombotics/antiplatelets: Secondary | ICD-10-CM | POA: Insufficient documentation

## 2014-09-09 DIAGNOSIS — M25572 Pain in left ankle and joints of left foot: Secondary | ICD-10-CM | POA: Diagnosis present

## 2014-09-09 LAB — BASIC METABOLIC PANEL
Anion gap: 9 (ref 5–15)
BUN: 15 mg/dL (ref 6–20)
CALCIUM: 8.8 mg/dL — AB (ref 8.9–10.3)
CO2: 22 mmol/L (ref 22–32)
Chloride: 104 mmol/L (ref 101–111)
Creatinine, Ser: 1.19 mg/dL (ref 0.61–1.24)
GFR calc Af Amer: >60 mL/min (ref >60)
GFR calc non Af Amer: 53 mL/min — ABNORMAL LOW (ref >60)
Glucose, Bld: 87 mg/dL (ref 65–99)
Potassium: 4.1 mmol/L (ref 3.5–5.1)
Sodium: 135 mmol/L (ref 135–145)

## 2014-09-09 LAB — CBC
HCT: 30.5 % — ABNORMAL LOW (ref 39.0–52.0)
HEMOGLOBIN: 10.3 g/dL — AB (ref 13.0–17.0)
MCH: 30.3 pg (ref 26.0–34.0)
MCHC: 33.8 g/dL (ref 30.0–36.0)
MCV: 89.7 fL (ref 78.0–100.0)
PLATELETS: 129 10*3/uL — AB (ref 150–400)
RBC: 3.4 MIL/uL — ABNORMAL LOW (ref 4.22–5.81)
RDW: 14.3 % (ref 11.5–15.5)
WBC: 6.1 10*3/uL (ref 4.0–10.5)

## 2014-09-09 MED ORDER — CLINDAMYCIN PHOSPHATE 600 MG/50ML IV SOLN
600.0000 mg | Freq: Once | INTRAVENOUS | Status: AC
  Administered 2014-09-09: 600 mg via INTRAVENOUS
  Filled 2014-09-09: qty 50

## 2014-09-09 MED ORDER — OXYCODONE-ACETAMINOPHEN 5-325 MG PO TABS
1.0000 | ORAL_TABLET | Freq: Once | ORAL | Status: AC
  Administered 2014-09-09: 1 via ORAL
  Filled 2014-09-09: qty 1

## 2014-09-09 MED ORDER — OXYCODONE-ACETAMINOPHEN 5-325 MG PO TABS: 1.0000 | ORAL_TABLET | Freq: Four times a day (QID) | ORAL | Status: DC | PRN

## 2014-09-09 MED ORDER — CLINDAMYCIN HCL 150 MG PO CAPS: 300.0000 mg | ORAL_CAPSULE | Freq: Four times a day (QID) | ORAL | Status: DC

## 2014-09-09 NOTE — ED Provider Notes (Signed)
CSN: [id number]     Arrival date & time 09/09/14  0807 History   First MD Initiated Contact with Patient 09/09/14 0818     Chief Complaint  Patient presents with  . Leg Pain     (Consider location/radiation/quality/duration/timing/severity/associated sxs/prior Treatment) HPI  Pt with hx of HTN, CAD, renal failure presents with c/o pain in left ankle- there is an area of redness over medial left foot and left ankle.  Pain with touching the area.  Pain has been present since this morning.  No known truam or falls.  No fever/chills.  No swelling of foot or leg.  Symptoms are constant and moderate. There are no other associated systemic symptoms, there are no other alleviating or modifying factors.  No trauma to that area or break in skin.   Past Medical History  Diagnosis Date  . Hypertension   . Coronary artery disease   . Diarrhea     secondary to Salmonella species  . Hypokalemia   . History of seizure disorder   . Nausea & vomiting   . Vitiligo   . Acute renal failure   . Deep venous thrombosis     Deep venous thrombosis prophylaxis  . Hiatal hernia 2003  . Atrial fibrillation   . CHF (congestive heart failure)   . Anemia   . Arthritis   . Heart murmur   . Seizures    Past Surgical History  Procedure Laterality Date  . Hernia repair     Family History  Problem Relation Age of Onset  . Stroke Mother   . Hyperlipidemia Mother   . Hypertension Mother   . Heart attack Father   . Kidney Stones Father   . Heart disease Father   . Colon cancer Neg Hx   . Diabetes Other     nephew  . Gout Brother   . Heart disease Brother    History  Substance Use Topics  . Smoking status: Former Games developermoker  . Smokeless tobacco: Never Used  . Alcohol Use: No    Review of Systems  ROS reviewed and all otherwise negative except for mentioned in HPI    Allergies  Review of patient's allergies indicates no known allergies.  Home Medications   Prior to Admission medications    Medication Sig Start Date End Date Taking? Authorizing Provider  benazepril (LOTENSIN) 10 MG tablet Take 1 tablet (10 mg total) by mouth daily. 08/05/14  Yes Duke SalviaSteven C Klein, MD  bisacodyl (DULCOLAX) 5 MG EC tablet Take 5-10 mg by mouth daily as needed for moderate constipation.   Yes Historical Provider, MD  Cyanocobalamin (VITAMIN B-12 PO) Take 1 tablet by mouth daily.    Yes Historical Provider, MD  dabigatran (PRADAXA) 150 MG CAPS capsule Take 1 capsule (150 mg total) by mouth every 12 (twelve) hours. 08/05/14  Yes Duke SalviaSteven C Klein, MD  doxycycline (VIBRA-TABS) 100 MG tablet Take 100 mg by mouth 2 (two) times daily. Take for 10 days starting 08/30/2014 08/30/14  Yes Historical Provider, MD  hydrochlorothiazide (HYDRODIURIL) 25 MG tablet Take 1 tablet (25 mg total) by mouth daily. Patient taking differently: Take 25 mg by mouth daily as needed (for edema).  08/05/14  Yes Duke SalviaSteven C Klein, MD  iron polysaccharides (FERREX 150) 150 MG capsule Take 150 mg by mouth daily.   Yes Historical Provider, MD  metoprolol tartrate (LOPRESSOR) 25 MG tablet Take 25 mg by mouth 2 (two) times daily.   Yes Historical Provider, MD  PHENobarbital (LUMINAL) 97.2  MG tablet Take 97.2 mg by mouth at bedtime.    Yes Historical Provider, MD  clindamycin (CLEOCIN) 150 MG capsule Take 2 capsules (300 mg total) by mouth every 6 (six) hours. 09/09/14   Jerelyn Scott, MD  oxyCODONE-acetaminophen (PERCOCET/ROXICET) 5-325 MG per tablet Take 1-2 tablets by mouth every 6 (six) hours as needed for severe pain. 09/09/14   Jerelyn Scott, MD   BP 110/74 mmHg  Pulse 64  Temp(Src) 98.2 F (36.8 C) (Oral)  Resp 16  Wt 182 lb 1.6 oz (82.6 kg)  SpO2 95%  Vitals reviewed Physical Exam  Physical Examination: General appearance - alert, well appearing, and in no distress Mental status - alert, oriented to person, place, and time Eyes - no conjunctival injection, no scleral icterus Mouth - mucous membranes moist, pharynx normal without  lesions Chest - clear to auscultation, no wheezes, rales or rhonchi, symmetric air entry Heart - normal rate, regular rhythm, normal S1, S2, no murmurs, rubs, clicks or gallops Abdomen - soft, nontender, nondistended, no masses or organomegaly Musculoskeletal - no joint tenderness, deformity or swelling, no pain with ROM of left ankle  Extremities - peripheral pulses normal, no pedal edema, no clubbing or cyanosis Skin - area of approx 4cm of erythema on left medial ankle, no abrasion/lacerations associated, area is mildly tender to palpation  ED Course  Procedures (including critical care time) Labs Review Labs Reviewed  CBC - Abnormal; Notable for the following:    RBC 3.40 (*)    Hemoglobin 10.3 (*)    HCT 30.5 (*)    Platelets 129 (*)    All other components within normal limits  BASIC METABOLIC PANEL - Abnormal; Notable for the following:    Calcium 8.8 (*)    GFR calc non Af Amer 53 (*)    All other components within normal limits    Imaging Review Dg Foot Complete Left  09/09/2014   CLINICAL DATA:  Soft tissue edema  EXAM: LEFT FOOT - COMPLETE 3+ VIEW  COMPARISON:  None.  FINDINGS: Frontal, oblique, and lateral views obtained. There is no fracture or dislocation. There is moderate narrowing of all PIP and DIP joints. No erosive change. There is a small inferior calcaneal spur. There is mild generalized soft tissue edema.  IMPRESSION: Mild generalized soft tissue edema. Narrowing of all PIP and DIP joints. No fracture or dislocation. No erosive change.   Electronically Signed   By: Bretta Bang III M.D.   On: 09/09/2014 10:19     EKG Interpretation None      MDM   Final diagnoses:  Cellulitis of left foot    Pt presenting with cellulitis of left foot- pt without elevation in WBC, no systemic signs of infection.  Started on clindamycin, pt given clindamycin for home use as well.  Advised to have area rechecked in 2 days.  Sister is his caretaker, she expresses that  taking care of him is becoming burdensome for her- I have ordered home health, and advised her to talk with his PMD about possible placement or extended home health.  She verbalized agreement and understanding.  Discharged with strict return precautions.  Pt agreeable with plan.    Jerelyn Scott, MD 09/10/14 [phone]

## 2014-09-09 NOTE — Discharge Instructions (Signed)
Return to the ED with any concerns including increased area of redness, swelling, fever/chills, vomiting and not able to keep down liquids or antibiotics, decreased level of alertness/lethargy, or any other alarming symptoms

## 2014-09-09 NOTE — ED Notes (Signed)
Pt arrives via EMS from home with sudden onset of left leg pain this AM. Pt denies recent fall, injury, or fever. Noted to have some mild redness around left ankle. Tender to palpation.

## 2014-09-16 ENCOUNTER — Encounter (HOSPITAL_COMMUNITY): Payer: Self-pay | Admitting: Emergency Medicine

## 2014-09-16 ENCOUNTER — Emergency Department (HOSPITAL_COMMUNITY)
Admission: EM | Admit: 2014-09-16 | Discharge: 2014-09-17 | Disposition: A | Discharge status: Home / Self Care | Payer: Medicare HMO | Attending: Emergency Medicine | Admitting: Emergency Medicine

## 2014-09-16 ENCOUNTER — Emergency Department (HOSPITAL_COMMUNITY)
Admission: EM | Admit: 2014-09-16 | Discharge: 2014-09-16 | Disposition: A | Discharge status: Home / Self Care | Payer: Medicare HMO | Attending: Emergency Medicine | Admitting: Emergency Medicine

## 2014-09-16 DIAGNOSIS — Z8669 Personal history of other diseases of the nervous system and sense organs: Secondary | ICD-10-CM | POA: Diagnosis not present

## 2014-09-16 DIAGNOSIS — K649 Unspecified hemorrhoids: Secondary | ICD-10-CM | POA: Insufficient documentation

## 2014-09-16 DIAGNOSIS — Y9289 Other specified places as the place of occurrence of the external cause: Secondary | ICD-10-CM | POA: Diagnosis not present

## 2014-09-16 DIAGNOSIS — Y998 Other external cause status: Secondary | ICD-10-CM | POA: Diagnosis not present

## 2014-09-16 DIAGNOSIS — Z792 Long term (current) use of antibiotics: Secondary | ICD-10-CM | POA: Diagnosis not present

## 2014-09-16 DIAGNOSIS — Y9389 Activity, other specified: Secondary | ICD-10-CM | POA: Insufficient documentation

## 2014-09-16 DIAGNOSIS — Z8639 Personal history of other endocrine, nutritional and metabolic disease: Secondary | ICD-10-CM | POA: Diagnosis not present

## 2014-09-16 DIAGNOSIS — I509 Heart failure, unspecified: Secondary | ICD-10-CM | POA: Insufficient documentation

## 2014-09-16 DIAGNOSIS — Z87448 Personal history of other diseases of urinary system: Secondary | ICD-10-CM | POA: Diagnosis not present

## 2014-09-16 DIAGNOSIS — G40909 Epilepsy, unspecified, not intractable, without status epilepticus: Secondary | ICD-10-CM | POA: Diagnosis not present

## 2014-09-16 DIAGNOSIS — I10 Essential (primary) hypertension: Secondary | ICD-10-CM | POA: Insufficient documentation

## 2014-09-16 DIAGNOSIS — D649 Anemia, unspecified: Secondary | ICD-10-CM | POA: Insufficient documentation

## 2014-09-16 DIAGNOSIS — Z862 Personal history of diseases of the blood and blood-forming organs and certain disorders involving the immune mechanism: Secondary | ICD-10-CM | POA: Insufficient documentation

## 2014-09-16 DIAGNOSIS — Z79899 Other long term (current) drug therapy: Secondary | ICD-10-CM | POA: Insufficient documentation

## 2014-09-16 DIAGNOSIS — K6289 Other specified diseases of anus and rectum: Secondary | ICD-10-CM

## 2014-09-16 DIAGNOSIS — R011 Cardiac murmur, unspecified: Secondary | ICD-10-CM | POA: Diagnosis not present

## 2014-09-16 DIAGNOSIS — M1990 Unspecified osteoarthritis, unspecified site: Secondary | ICD-10-CM | POA: Diagnosis not present

## 2014-09-16 DIAGNOSIS — R300 Dysuria: Secondary | ICD-10-CM

## 2014-09-16 DIAGNOSIS — Z8719 Personal history of other diseases of the digestive system: Secondary | ICD-10-CM | POA: Diagnosis not present

## 2014-09-16 DIAGNOSIS — T446X1A Poisoning by alpha-adrenoreceptor antagonists, accidental (unintentional), initial encounter: Secondary | ICD-10-CM | POA: Insufficient documentation

## 2014-09-16 DIAGNOSIS — Z86711 Personal history of pulmonary embolism: Secondary | ICD-10-CM | POA: Insufficient documentation

## 2014-09-16 DIAGNOSIS — I2510 Atherosclerotic heart disease of native coronary artery without angina pectoris: Secondary | ICD-10-CM | POA: Insufficient documentation

## 2014-09-16 DIAGNOSIS — Z87891 Personal history of nicotine dependence: Secondary | ICD-10-CM | POA: Insufficient documentation

## 2014-09-16 DIAGNOSIS — T50901A Poisoning by unspecified drugs, medicaments and biological substances, accidental (unintentional), initial encounter: Secondary | ICD-10-CM

## 2014-09-16 DIAGNOSIS — F0390 Unspecified dementia without behavioral disturbance: Secondary | ICD-10-CM | POA: Insufficient documentation

## 2014-09-16 DIAGNOSIS — R42 Dizziness and giddiness: Secondary | ICD-10-CM | POA: Diagnosis present

## 2014-09-16 DIAGNOSIS — K5900 Constipation, unspecified: Secondary | ICD-10-CM | POA: Diagnosis present

## 2014-09-16 MED ORDER — SODIUM CHLORIDE 0.9 % IV BOLUS (SEPSIS)
1000.0000 mL | Freq: Once | INTRAVENOUS | Status: AC
  Administered 2014-09-16: 1000 mL via INTRAVENOUS

## 2014-09-16 MED ORDER — STARCH 51 % RE SUPP: 1.0000 | RECTAL | Status: DC | PRN

## 2014-09-16 NOTE — ED Notes (Addendum)
Spoke with PC, Elnita Maxwellheryl, who reports peak of medication 2-3 hours post ingestion. At present time, given IV fluids; if not responding, recommend phenylephrine. Observation for 6 hours. Effie ShyWentz MD aware.

## 2014-09-16 NOTE — Progress Notes (Signed)
Dov Dill J. Lucretia RoersWood, RN, BSN, [employer] [phone] Spoke with pt at bedside regarding discharge planning for [facility]. Offered pt list of home health agencies to choose from.  [facility]. Otelia SanteeFarrah of CS notified.  No DME needs identified at this time.

## 2014-09-16 NOTE — Progress Notes (Signed)
LCSW was requested by CM to look at patient's chart and insurance. Patient has [employer] in which does not need a 3 day qualifying stay for SNF. Patient will need a PT/OT consult with a skilled need.   LCSW has called another representative as patient is up for DC with HH in effort to understand if patient can use PT notes in the home setting to be approved for SNF.  Once this is known, LCSW will follow up with family. Currently LCSW is NOT working patient up for placement. Only getting information for family in case SNF is warranted in the community.  Donald EmoryHannah Laquisha Northcraft LCSW, MSW Clinical Social Work: Emergency Room [phone]

## 2014-09-16 NOTE — ED Notes (Addendum)
Sister reports [facility]. Sister continues to reports staff told her to pick up Annabell HowellsWrenn MD prescriptions; sister states "I thought Wrenn's prescription was for the urinary tract infection."  Doxazosin given at 1530.

## 2014-09-16 NOTE — ED Provider Notes (Signed)
CSN: [id number]     Arrival date & time 09/16/14  4098 History   First MD Initiated Contact with Patient 09/16/14 1136     Chief Complaint  Patient presents with  . Constipation     HPI  Patient symptoms with concern of constipation, though when actually questioned patient states that he had a bowel movement today, and another yesterday. He specifies that he has recently been on narcotics for foot pain, but has recently stopped taking his narcotics as well. Patient states that he feels"fine," cannot specify how he will be helped in the emergency department. During the initial evaluation the patient's dementia is clear. Level V caveat. Patient's sister, with he lives, states that she is having increasing difficulty taking care of the patient home, largely due to his worsening dementia. She denies any recent fever, vomiting, falls.  Patient does acknowledge recent dysuria. However patient saw his urologist yesterday, was started on antibiotics, which have not been procured. Patient also claims of rectal pain, saw his primary care doctor yesterday, has not started new medication for hemorrhoids.   Past Medical History  Diagnosis Date  . Hypertension   . Coronary artery disease   . Diarrhea     secondary to Salmonella species  . Hypokalemia   . History of seizure disorder   . Nausea & vomiting   . Vitiligo   . Acute renal failure   . Deep venous thrombosis     Deep venous thrombosis prophylaxis  . Hiatal hernia 2003  . Atrial fibrillation   . CHF (congestive heart failure)   . Anemia   . Arthritis   . Heart murmur   . Seizures    Past Surgical History  Procedure Laterality Date  . Hernia repair     Family History  Problem Relation Age of Onset  . Stroke Mother   . Hyperlipidemia Mother   . Hypertension Mother   . Heart attack Father   . Kidney Stones Father   . Heart disease Father   . Colon cancer Neg Hx   . Diabetes Other     nephew  . Gout Brother   . Heart  disease Brother    History  Substance Use Topics  . Smoking status: Former Games developer  . Smokeless tobacco: Never Used  . Alcohol Use: No    Review of Systems  Unable to perform ROS: Dementia      Allergies  Review of patient's allergies indicates no known allergies.  Home Medications   Prior to Admission medications   Medication Sig Start Date End Date Taking? Authorizing Provider  benazepril (LOTENSIN) 10 MG tablet Take 1 tablet (10 mg total) by mouth daily. 08/05/14   Duke Salvia, MD  bisacodyl (DULCOLAX) 5 MG EC tablet Take 5-10 mg by mouth daily as needed for moderate constipation.    Historical Provider, MD  clindamycin (CLEOCIN) 150 MG capsule Take 2 capsules (300 mg total) by mouth every 6 (six) hours. 09/09/14   Jerelyn Scott, MD  Cyanocobalamin (VITAMIN B-12 PO) Take 1 tablet by mouth daily.     Historical Provider, MD  dabigatran (PRADAXA) 150 MG CAPS capsule Take 1 capsule (150 mg total) by mouth every 12 (twelve) hours. 08/05/14   Duke Salvia, MD  doxycycline (VIBRA-TABS) 100 MG tablet Take 100 mg by mouth 2 (two) times daily. Take for 10 days starting 08/30/2014 08/30/14   Historical Provider, MD  hydrochlorothiazide (HYDRODIURIL) 25 MG tablet Take 1 tablet (25 mg total) by mouth  daily. Patient taking differently: Take 25 mg by mouth daily as needed (for edema).  08/05/14   Duke SalviaSteven C Klein, MD  iron polysaccharides (FERREX 150) 150 MG capsule Take 150 mg by mouth daily.    Historical Provider, MD  metoprolol tartrate (LOPRESSOR) 25 MG tablet Take 25 mg by mouth 2 (two) times daily.    Historical Provider, MD  oxyCODONE-acetaminophen (PERCOCET/ROXICET) 5-325 MG per tablet Take 1-2 tablets by mouth every 6 (six) hours as needed for severe pain. 09/09/14   Jerelyn ScottMartha Linker, MD  PHENobarbital (LUMINAL) 97.2 MG tablet Take 97.2 mg by mouth at bedtime.     Historical Provider, MD  starch (ANUSOL) 51 % suppository Place 1 suppository rectally as needed for pain. 09/16/14   Gerhard Munchobert  Courtenay Creger, MD   BP 135/93 mmHg  Pulse 69  Temp(Src) 98 F (36.7 C) (Oral)  Resp 24  Ht 6\' 6"  (1.981 m)  Wt 169 lb (76.658 kg)  BMI 19.53 kg/m2  SpO2 99% Physical Exam  Constitutional: He appears well-developed. No distress.  HENT:  Head: Normocephalic and atraumatic.  Eyes: Conjunctivae and EOM are normal.  Cardiovascular: Normal rate and regular rhythm.   Pulmonary/Chest: Effort normal. No stridor. No respiratory distress.  Abdominal: He exhibits no distension. There is no tenderness.  Genitourinary:     Musculoskeletal: He exhibits no edema.  Neurological: He is alert. He displays no atrophy and no tremor. No cranial nerve deficit or sensory deficit. He exhibits normal muscle tone. He displays no seizure activity.  Skin: Skin is warm and dry.  Psychiatric: His speech is tangential. He is withdrawn. Cognition and memory are impaired.  Nursing note and vitals reviewed.   ED Course  Procedures (including critical care time) I reviewed the patient's chart, including recent evaluation here for ankle pain.  Subsequently discussed this case with our social work team, who arranged for additional home health assistance for the patient, and his sister.  MDM   Final diagnoses:  Dysuria  Rectal pain    Patient presents with varying complaints, but no evidence for distress, occult infection, imminent decompensation. Patient's dementia clouds the evaluation, but it is clear that the patient does have dysuria, was recently prescribed antibiotics, which on IV heparin field, he was advised to do so. Patient also has rectal pain, and has unremarkable hemorrhoid on exam. Patient and his sister received resources, with home health assistance, and he was appropriate for discharge with further evaluation, management to occur as an outpatient.    Gerhard Munchobert Copelan Maultsby, MD 09/16/14 [phone]

## 2014-09-16 NOTE — ED Notes (Signed)
Per Effie ShyWentz administer bolus stat; hold protocol orders at present time.

## 2014-09-16 NOTE — ED Provider Notes (Signed)
CSN: [id number]     Arrival date & time 09/16/14  1706 History   First MD Initiated Contact with Patient 09/16/14 1752     Chief Complaint  Patient presents with  . Hypotension  . Nausea  . Dizziness     (Consider location/radiation/quality/duration/timing/severity/associated sxs/prior Treatment) Patient is a 81 y.o. male presenting with dizziness. The history is provided by the patient and a relative.  Dizziness    Donald Reynolds is a 81 y.o. male who presents for evaluation of inadvertent overdose of doxazosin. Patient's daughter was help him take medication, and he was given four doxazosin tablets, this is 8 times as much as his usual single dose of the medication.    Level V Caveat- Dementia  Past Medical History  Diagnosis Date  . Hypertension   . Coronary artery disease   . Diarrhea     secondary to Salmonella species  . Hypokalemia   . History of seizure disorder   . Nausea & vomiting   . Vitiligo   . Acute renal failure   . Deep venous thrombosis     Deep venous thrombosis prophylaxis  . Hiatal hernia 2003  . Atrial fibrillation   . CHF (congestive heart failure)   . Anemia   . Arthritis   . Heart murmur   . Seizures    Past Surgical History  Procedure Laterality Date  . Hernia repair     Family History  Problem Relation Age of Onset  . Stroke Mother   . Hyperlipidemia Mother   . Hypertension Mother   . Heart attack Father   . Kidney Stones Father   . Heart disease Father   . Colon cancer Neg Hx   . Diabetes Other     nephew  . Gout Brother   . Heart disease Brother    History  Substance Use Topics  . Smoking status: Former Games developer  . Smokeless tobacco: Never Used  . Alcohol Use: No    Review of Systems  Unable to perform ROS Neurological: Positive for dizziness.      Allergies  Review of patient's allergies indicates no known allergies.  Home Medications   Prior to Admission medications   Medication Sig Start Date End Date  Taking? Authorizing Provider  benazepril (LOTENSIN) 10 MG tablet Take 1 tablet (10 mg total) by mouth daily. 08/05/14  Yes Duke Salvia, MD  bisacodyl (DULCOLAX) 5 MG EC tablet Take 5-10 mg by mouth daily as needed for moderate constipation (constipation).    Yes Historical Provider, MD  clindamycin (CLEOCIN) 150 MG capsule Take 2 capsules (300 mg total) by mouth every 6 (six) hours. 09/09/14  Yes Jerelyn Scott, MD  Cyanocobalamin (VITAMIN B-12 PO) Take 1 tablet by mouth daily.    Yes Historical Provider, MD  dabigatran (PRADAXA) 150 MG CAPS capsule Take 1 capsule (150 mg total) by mouth every 12 (twelve) hours. 08/05/14  Yes Duke Salvia, MD  doxazosin (CARDURA) 4 MG tablet Take 2-4 mg by mouth daily. Take 0.5 tablet (2 mg) for 1st 4 Days then Take 1 tablet (4 mg) thereafter. 09/15/14  Yes Historical Provider, MD  hydrochlorothiazide (HYDRODIURIL) 25 MG tablet Take 1 tablet (25 mg total) by mouth daily. Patient taking differently: Take 25 mg by mouth daily as needed (for edema).  08/05/14  Yes Duke Salvia, MD  iron polysaccharides (FERREX 150) 150 MG capsule Take 150 mg by mouth daily.   Yes Historical Provider, MD  PHENobarbital (LUMINAL) 97.2 MG  tablet Take 97.2 mg by mouth at bedtime.    Yes Historical Provider, MD  starch (ANUSOL) 51 % suppository Place 1 suppository rectally as needed for pain. 09/16/14  Yes Gerhard Munchobert Lockwood, MD  oxyCODONE-acetaminophen (PERCOCET/ROXICET) 5-325 MG per tablet Take 1-2 tablets by mouth every 6 (six) hours as needed for severe pain. Patient not taking: Reported on 09/16/2014 09/09/14   Jerelyn ScottMartha Linker, MD   BP 97/56 mmHg  Pulse 73  Temp(Src) 98.7 F (37.1 C) (Oral)  Resp 18  SpO2 97% Physical Exam  ED Course  Procedures (including critical care time)   Medications  sodium chloride 0.9 % bolus 1,000 mL (0 mLs Intravenous Stopped 09/16/14 1808)    Patient Vitals for the past 24 hrs:  BP Temp Temp src Pulse Resp SpO2  09/16/14 2249 97/56 mmHg - - 73 18 97 %   09/16/14 2245 93/57 mmHg - - 65 18 100 %  09/16/14 2100 109/67 mmHg - - - 16 -  09/16/14 2045 110/70 mmHg - - - 21 -  09/16/14 2030 104/73 mmHg - - 68 15 98 %  09/16/14 2015 108/74 mmHg - - 79 18 98 %  09/16/14 2000 112/73 mmHg - - 100 (!) 29 96 %  09/16/14 1945 92/62 mmHg - - (!) 54 11 95 %  09/16/14 1930 101/78 mmHg - - 77 17 95 %  09/16/14 1915 98/65 mmHg - - 68 12 96 %  09/16/14 1900 96/61 mmHg - - 74 20 97 %  09/16/14 1845 90/66 mmHg - - - 16 -  09/16/14 1830 109/76 mmHg - - 80 - 94 %  09/16/14 1822 (!) 97/52 mmHg - - 80 18 96 %  09/16/14 1815 (!) 89/53 mmHg - - 78 - 98 %  09/16/14 1800 92/58 mmHg - - 76 16 100 %  09/16/14 1743 (!) 89/49 mmHg - - 78 20 95 %  09/16/14 1732 (!) 89/57 mmHg - - 70 18 93 %  09/16/14 1721 94/61 mmHg 98.7 F (37.1 C) Oral 77 22 93 %  09/16/14 1712 - - - - - 92 %    11:26 PM Reevaluation with update and discussion. After initial assessment and treatment, an updated evaluation reveals orthostatic blood pressure testing done and reveals normalization of blood pressure, when standing. Patient has been observed now 6 hours and is asymptomatic. Donald Reynolds      Labs Review Labs Reviewed - No data to display  Imaging Review No results found.   EKG Interpretation   Date/Time:  Thursday September 16 2014 17:21:08 EDT Ventricular Rate:  55 PR Interval:    QRS Duration: 99 QT Interval:  427 QTC Calculation: 408 R Axis:   -63 Text Interpretation:  Atrial fibrillation Inferior infarct, age  indeterminate since last tracing no significant change Confirmed by Effie ShyWENTZ   MD, Deissy Guilbert [phone]([id number]) on 09/16/2014 8:32:06 PM      MDM   Final diagnoses:  Accidental overdose, initial encounter    Accidental Drug Overdose, anti-hypertensive medication. Improved after IVF and observation.  Nursing Notes Reviewed/ Care Coordinated Applicable Imaging Reviewed Interpretation of Laboratory Data incorporated into ED treatment  The patient appears reasonably  screened and/or stabilized for discharge and I doubt any other medical condition or other [facility] requiring further screening, evaluation, or treatment in the ED at this time prior to discharge.  Plan: Home Medications- usual starting tomorrow; Home Treatments- rest; return here if the recommended treatment, does not improve the symptoms; Recommended follow up- PCP prn  Mancel Bale, MD 09/16/14 2328

## 2014-09-16 NOTE — Discharge Instructions (Signed)
As discussed, your evaluation today has been largely reassuring.  But, it is important that you monitor your condition carefully, and do not hesitate to return to the ED if you develop new, or concerning changes in your condition.  Please be sure to obtain the prescription provided by Dr. Annabell HowellsWrenn.  Otherwise, please follow-up with your physician for appropriate ongoing care.

## 2014-09-16 NOTE — ED Notes (Signed)
Bed: WA25 Expected date:  Expected time:  Means of arrival:  Comments: EMS-hypotension 

## 2014-09-16 NOTE — Care Management Note (Addendum)
Case Management Note  Patient Details  Name: Geanie BerlinWilliam E Reh MRN: [id number] Date of Birth: [birth year]  Subjective/Objective:     Pt with hx of HTN, CAD, renal failure presents with c/o constipation. Lives at home with sister.        Action/Plan: Home with Home Health Services.  Expected Discharge Date:                  Expected Discharge Plan:  Home w Home Health Services  In-House Referral:  Clinical Social Work  Discharge planning Services  CM Consult  Post Acute Care Choice:  Home Health Choice offered to:  Patient, Sibling  DME Arranged:    DME Agency:     HH Arranged:  RN, Nurse's Aide (Social Work) [facility]  Status of Service:  Completed, signed off  Medicare Important Message Given:    Date Medicare IM Given:    Medicare IM give by:    Date Additional Medicare IM Given:    Additional Medicare Important Message give by:     If discussed at Long Length of Stay Meetings, dates discussed:    Additional Comments:  Oletta CohnWood, Kylie Gros, RN 09/16/2014, 12:19 PM

## 2014-09-16 NOTE — ED Notes (Signed)
Per family member, pt arrived by ambulance for accidentally taking 4 antibiotic pills when he was supposed 0.5, then dizziness and fall followed.

## 2014-09-16 NOTE — ED Notes (Addendum)
Per EMS [facility]; prescribed Doxazosin 1/2 tablet for first 4 days then 1 tablet after. Pt took 4 tablets today misreading prescription. Side effects hypotension, dizziness, etc as read in detail. Pt given 500 ml of fluid and 4 mg zofran IV en route with EMS.

## 2014-09-16 NOTE — ED Notes (Addendum)
Pt and wife states patient is "constipated".   But patient has been having watery stools this week.  Last bowel movement was yesterday.  Patient states he does have pain when he has a bowel movement and when he urinates.   Patient states he was given "oxycodone for constipation".   Patient went to regular doctor yesterday and she took patient off his oxycodone.   Patient wife states the exam yesterday was fine.   They state that he "has never been diagnosed with what is really wrong".   Patient states he also went to his kidney doctor yesterday, but he didn't get medicine filled yet.

## 2014-09-16 NOTE — ED Notes (Signed)
Donald CraftsSister, Anne, contact information:   HOME [phone] CELL [phone]

## 2014-09-16 NOTE — ED Notes (Signed)
Wentz aware of present VS. Pt denies dizziness, nausea, or pain at present time.

## 2014-09-17 NOTE — ED Notes (Signed)
[facility]'s home [facility].

## 2015-06-12 ENCOUNTER — Emergency Department (HOSPITAL_COMMUNITY)
Admission: EM | Admit: 2015-06-12 | Discharge: 2015-06-12 | Disposition: A | Discharge status: Home / Self Care | Payer: Medicare HMO | Attending: Emergency Medicine | Admitting: Emergency Medicine

## 2015-06-12 ENCOUNTER — Encounter (HOSPITAL_COMMUNITY): Payer: Self-pay | Admitting: Emergency Medicine

## 2015-06-12 DIAGNOSIS — D649 Anemia, unspecified: Secondary | ICD-10-CM | POA: Diagnosis not present

## 2015-06-12 DIAGNOSIS — Y92002 Bathroom of unspecified non-institutional (private) residence single-family (private) house as the place of occurrence of the external cause: Secondary | ICD-10-CM | POA: Diagnosis not present

## 2015-06-12 DIAGNOSIS — I2510 Atherosclerotic heart disease of native coronary artery without angina pectoris: Secondary | ICD-10-CM | POA: Diagnosis not present

## 2015-06-12 DIAGNOSIS — Z872 Personal history of diseases of the skin and subcutaneous tissue: Secondary | ICD-10-CM | POA: Insufficient documentation

## 2015-06-12 DIAGNOSIS — Y9301 Activity, walking, marching and hiking: Secondary | ICD-10-CM | POA: Diagnosis not present

## 2015-06-12 DIAGNOSIS — Z8719 Personal history of other diseases of the digestive system: Secondary | ICD-10-CM | POA: Insufficient documentation

## 2015-06-12 DIAGNOSIS — W1839XA Other fall on same level, initial encounter: Secondary | ICD-10-CM | POA: Insufficient documentation

## 2015-06-12 DIAGNOSIS — Y998 Other external cause status: Secondary | ICD-10-CM | POA: Diagnosis not present

## 2015-06-12 DIAGNOSIS — I10 Essential (primary) hypertension: Secondary | ICD-10-CM | POA: Insufficient documentation

## 2015-06-12 DIAGNOSIS — N400 Enlarged prostate without lower urinary tract symptoms: Secondary | ICD-10-CM | POA: Diagnosis not present

## 2015-06-12 DIAGNOSIS — I4891 Unspecified atrial fibrillation: Secondary | ICD-10-CM | POA: Insufficient documentation

## 2015-06-12 DIAGNOSIS — Z87448 Personal history of other diseases of urinary system: Secondary | ICD-10-CM | POA: Diagnosis not present

## 2015-06-12 DIAGNOSIS — Z8669 Personal history of other diseases of the nervous system and sense organs: Secondary | ICD-10-CM | POA: Diagnosis not present

## 2015-06-12 DIAGNOSIS — F0390 Unspecified dementia without behavioral disturbance: Secondary | ICD-10-CM | POA: Diagnosis not present

## 2015-06-12 DIAGNOSIS — W19XXXA Unspecified fall, initial encounter: Secondary | ICD-10-CM

## 2015-06-12 DIAGNOSIS — R011 Cardiac murmur, unspecified: Secondary | ICD-10-CM | POA: Insufficient documentation

## 2015-06-12 DIAGNOSIS — Z043 Encounter for examination and observation following other accident: Secondary | ICD-10-CM | POA: Diagnosis present

## 2015-06-12 DIAGNOSIS — Z87891 Personal history of nicotine dependence: Secondary | ICD-10-CM | POA: Diagnosis not present

## 2015-06-12 DIAGNOSIS — Z8639 Personal history of other endocrine, nutritional and metabolic disease: Secondary | ICD-10-CM | POA: Diagnosis not present

## 2015-06-12 DIAGNOSIS — Y92009 Unspecified place in unspecified non-institutional (private) residence as the place of occurrence of the external cause: Secondary | ICD-10-CM

## 2015-06-12 DIAGNOSIS — M1990 Unspecified osteoarthritis, unspecified site: Secondary | ICD-10-CM | POA: Insufficient documentation

## 2015-06-12 DIAGNOSIS — Z86718 Personal history of other venous thrombosis and embolism: Secondary | ICD-10-CM | POA: Diagnosis not present

## 2015-06-12 DIAGNOSIS — I509 Heart failure, unspecified: Secondary | ICD-10-CM | POA: Insufficient documentation

## 2015-06-12 DIAGNOSIS — Z79899 Other long term (current) drug therapy: Secondary | ICD-10-CM | POA: Insufficient documentation

## 2015-06-12 HISTORY — DX: Unspecified dementia, unspecified severity, without behavioral disturbance, psychotic disturbance, mood disturbance, and anxiety: F03.90

## 2015-06-12 HISTORY — DX: Benign prostatic hyperplasia without lower urinary tract symptoms: N40.0

## 2015-06-12 NOTE — ED Provider Notes (Signed)
CSN: [id number]     Arrival date & time 06/12/15  0123 History  By signing my name below, I, Evon Slack, attest that this documentation has been prepared under the direction and in the presence of Devoria Albe, MD at [phone]. Electronically Signed: Evon Slack, ED Scribe. 06/12/2015. 2:09 AM.     Chief Complaint  Patient presents with  . Fall   Patient is a 82 y.o. male presenting with fall. The history is provided by the patient. No language interpreter was used.  Fall Pertinent negatives include no headaches.    HPI Comments: Donald Reynolds is a 82 y.o. male brought in by ambulance, who presents to the Emergency Department complaining of fall onset tonight PTA. Pt states that his feet came from up under him walking out of the bathroom. Pt states that he fell forward. Pt denies any pain at this time. Pt states that he normally ambulates with a walker. He states that he is not able to use the walker in the restroom due to it being too small. Pt denies head injury or LOC. He denies any injury from this fall. Denies dizziness, light headedness, HA or CP. EMS was called because his family was unable to get him up.   PCP Dr Rochele Pages  Past Medical History  Diagnosis Date  . Hypertension   . Coronary artery disease   . Diarrhea     secondary to Salmonella species  . Hypokalemia   . History of seizure disorder   . Nausea & vomiting   . Vitiligo   . Acute renal failure (HCC)   . Deep venous thrombosis (HCC)     Deep venous thrombosis prophylaxis  . Hiatal hernia 2003  . Atrial fibrillation (HCC)   . CHF (congestive heart failure) (HCC)   . Anemia   . Arthritis   . Heart murmur   . Seizures (HCC)   . Enlarged prostate   . Dementia    Past Surgical History  Procedure Laterality Date  . Hernia repair     Family History  Problem Relation Age of Onset  . Stroke Mother   . Hyperlipidemia Mother   . Hypertension Mother   . Heart attack Father   . Kidney Stones Father   .  Heart disease Father   . Colon cancer Neg Hx   . Diabetes Other     nephew  . Gout Brother   . Heart disease Brother    Social History  Substance Use Topics  . Smoking status: Former Games developer  . Smokeless tobacco: Never Used  . Alcohol Use: No  lives with sister Uses a walker  Review of Systems  Musculoskeletal: Negative for arthralgias.  Neurological: Negative for dizziness, syncope, light-headedness and headaches.  All other systems reviewed and are negative.    Allergies  Review of patient's allergies indicates no known allergies.  Home Medications   Prior to Admission medications   Medication Sig Start Date End Date Taking? Authorizing Provider  benazepril (LOTENSIN) 10 MG tablet Take 1 tablet (10 mg total) by mouth daily. 08/05/14   Duke Salvia, MD  bisacodyl (DULCOLAX) 5 MG EC tablet Take 5-10 mg by mouth daily as needed for moderate constipation (constipation).     Historical Provider, MD  clindamycin (CLEOCIN) 150 MG capsule Take 2 capsules (300 mg total) by mouth every 6 (six) hours. 09/09/14   Jerelyn Scott, MD  Cyanocobalamin (VITAMIN B-12 PO) Take 1 tablet by mouth daily.     Historical Provider,  MD  dabigatran (PRADAXA) 150 MG CAPS capsule Take 1 capsule (150 mg total) by mouth every 12 (twelve) hours. 08/05/14   Duke Salvia, MD  doxazosin (CARDURA) 4 MG tablet Take 2-4 mg by mouth daily. Take 0.5 tablet (2 mg) for 1st 4 Days then Take 1 tablet (4 mg) thereafter. 09/15/14   Historical Provider, MD  hydrochlorothiazide (HYDRODIURIL) 25 MG tablet Take 1 tablet (25 mg total) by mouth daily. Patient taking differently: Take 25 mg by mouth daily as needed (for edema).  08/05/14   Duke Salvia, MD  iron polysaccharides (FERREX 150) 150 MG capsule Take 150 mg by mouth daily.    Historical Provider, MD  oxyCODONE-acetaminophen (PERCOCET/ROXICET) 5-325 MG per tablet Take 1-2 tablets by mouth every 6 (six) hours as needed for severe pain. Patient not taking: Reported on  09/16/2014 09/09/14   Jerelyn Scott, MD  PHENobarbital (LUMINAL) 97.2 MG tablet Take 97.2 mg by mouth at bedtime.     Historical Provider, MD  starch (ANUSOL) 51 % suppository Place 1 suppository rectally as needed for pain. 09/16/14   Gerhard Munch, MD   BP 124/81 mmHg  Pulse 84  Temp(Src) 97.8 F (36.6 C) (Oral)  Resp 16  SpO2 97%   Physical Exam  Constitutional: He is oriented to person, place, and time. He appears well-developed and well-nourished.  Non-toxic appearance. He does not appear ill. No distress.  HENT:  Head: Normocephalic and atraumatic.  Right Ear: External ear normal.  Left Ear: External ear normal.  Nose: Nose normal. No mucosal edema or rhinorrhea.  Mouth/Throat: Oropharynx is clear and moist and mucous membranes are normal. No dental abscesses or uvula swelling.  Poor dentition.   Eyes: Conjunctivae and EOM are normal. Pupils are equal, round, and reactive to light.  Neck: Normal range of motion and full passive range of motion without pain. Neck supple.  Cardiovascular: Normal rate, regular rhythm and normal heart sounds.  Exam reveals no gallop and no friction rub.   No murmur heard. Pulmonary/Chest: Effort normal and breath sounds normal. No respiratory distress. He has no wheezes. He has no rhonchi. He has no rales. He exhibits no tenderness and no crepitus.  Abdominal: Soft. Normal appearance and bowel sounds are normal. He exhibits no distension. There is no tenderness. There is no rebound and no guarding.  Musculoskeletal: Normal range of motion. He exhibits no edema or tenderness.  Moves all extremities well. No pain with full ROM or upper and lower extremities  Neurological: He is alert and oriented to person, place, and time. He has normal strength. No cranial nerve deficit.  Skin: Skin is warm, dry and intact. No rash noted. No erythema. No pallor.  vitiligo on scalp and face.  Psychiatric: He has a normal mood and affect. His speech is normal and behavior  is normal. His mood appears not anxious.  Nursing note and vitals reviewed.   ED Course  Procedures (including critical care time)   DIAGNOSTIC STUDIES: Oxygen Saturation is 97% on RA, normal by my interpretation.    COORDINATION OF CARE: 2:06 AM-Discussed treatment plan with pt at bedside and pt agreed to plan.   Staff was able to have patient stand at bedside with minimal assistance (normally uses a walker and was steady.   Patient has no complaints. No further testing was done.  MDM   Final diagnoses:  Fall at home, initial encounter   Plan discharge  Devoria Albe, MD, FACEP    I personally performed the  services described in this documentation, which was scribed in my presence. The recorded information has been reviewed and considered.  Devoria Albe, MD, Concha Pyo, MD 06/12/15 [phone]

## 2015-06-12 NOTE — ED Notes (Signed)
Pt's POA (sister) refused to sign discharge paperwork because primary doctor and medications were not correct. Registration and Pharmacy notified and updated information.

## 2015-06-12 NOTE — ED Notes (Signed)
Pt was able to stand with minimal assistance and is steady.

## 2015-06-12 NOTE — ED Notes (Addendum)
Pt arrives by [facility] post fall. Sister called EMS because pt fell while walking without walker and she could not help him off the floor. EMS got him up and he walked to bedroom to dress, to bathroom and back to EMS stretcher unassisted. Pt stated he wanted to come to hospital to get checked out. Pt has hx of dementia, sister has POA. EMS palpated 136 pressure, RR 18, P 64. Pt started flomax and phenobarbitol recently.

## 2015-06-12 NOTE — Discharge Instructions (Signed)
Recheck as needed °

## 2015-07-26 ENCOUNTER — Ambulatory Visit (INDEPENDENT_AMBULATORY_CARE_PROVIDER_SITE_OTHER): Payer: Medicare HMO | Admitting: Internal Medicine

## 2015-07-26 ENCOUNTER — Encounter: Payer: Self-pay | Admitting: Internal Medicine

## 2015-07-26 VITALS — BP 122/74 | HR 62 | Ht 70.0 in | Wt 184.4 lb

## 2015-07-26 DIAGNOSIS — I10 Essential (primary) hypertension: Secondary | ICD-10-CM | POA: Diagnosis not present

## 2015-07-26 DIAGNOSIS — I482 Chronic atrial fibrillation: Secondary | ICD-10-CM

## 2015-07-26 DIAGNOSIS — I4821 Permanent atrial fibrillation: Secondary | ICD-10-CM

## 2015-07-26 LAB — CBC WITH DIFFERENTIAL/PLATELET
Basophils Absolute: 38 cells/uL (ref 0–200)
Basophils Relative: 1 %
EOS PCT: 0 %
Eosinophils Absolute: 0 cells/uL — ABNORMAL LOW (ref 15–500)
HCT: 36.2 % — ABNORMAL LOW (ref 38.5–50.0)
Hemoglobin: 12.3 g/dL — ABNORMAL LOW (ref 13.2–17.1)
LYMPHS PCT: 19 %
Lymphs Abs: 722 cells/uL — ABNORMAL LOW (ref 850–3900)
MCH: 30.4 pg (ref 27.0–33.0)
MCHC: 34 g/dL (ref 32.0–36.0)
MCV: 89.4 fL (ref 80.0–100.0)
MPV: 11.1 fL (ref 7.5–12.5)
Monocytes Absolute: 266 cells/uL (ref 200–950)
Monocytes Relative: 7 %
NEUTROS PCT: 73 %
Neutro Abs: 2774 cells/uL (ref 1500–7800)
Platelets: 124 10*3/uL — ABNORMAL LOW (ref 140–400)
RBC: 4.05 MIL/uL — AB (ref 4.20–5.80)
RDW: 15.4 % — AB (ref 11.0–15.0)
WBC: 3.8 10*3/uL (ref 3.8–10.8)

## 2015-07-26 LAB — BASIC METABOLIC PANEL
BUN: 19 mg/dL (ref 7–25)
CO2: 24 mmol/L (ref 20–31)
Calcium: 9.5 mg/dL (ref 8.6–10.3)
Chloride: 105 mmol/L (ref 98–110)
Creat: 1.06 mg/dL (ref 0.70–1.11)
Glucose, Bld: 90 mg/dL (ref 65–99)
POTASSIUM: 4.1 mmol/L (ref 3.5–5.3)
SODIUM: 140 mmol/L (ref 135–146)

## 2015-07-26 MED ORDER — HYDROCHLOROTHIAZIDE 12.5 MG PO CAPS: 12.5000 mg | ORAL_CAPSULE | Freq: Every day | ORAL | Status: DC

## 2015-07-26 NOTE — Progress Notes (Signed)
Patient Care Team: Provider Not In System as PCP - General   HPI  Donald Reynolds is a 82 y.o. male Seen in followup for atrial fibrillation and hypertension. He has a CHADS-VASc score of 4 and was started last year on Pradaxa  The patient denies chest pain, shortness of breath, nocturnal dyspnea, orthopnea; he has edema  He was in rehab for 6 months for ? Indications    He is not having significant sodium intake     Past Medical History  Diagnosis Date  . Hypertension   . Coronary artery disease   . Diarrhea     secondary to Salmonella species  . Hypokalemia   . History of seizure disorder   . Nausea & vomiting   . Vitiligo   . Acute renal failure (HCC)   . Deep venous thrombosis (HCC)     Deep venous thrombosis prophylaxis  . Hiatal hernia 2003  . Atrial fibrillation (HCC)   . CHF (congestive heart failure) (HCC)   . Anemia   . Arthritis   . Heart murmur   . Seizures (HCC)   . Enlarged prostate   . Dementia     Past Surgical History  Procedure Laterality Date  . Hernia repair      Current Outpatient Prescriptions  Medication Sig Dispense Refill  . benazepril (LOTENSIN) 10 MG tablet Take 1 tablet (10 mg total) by mouth daily. 30 tablet 2  . cholecalciferol (VITAMIN D) 1000 units tablet Take 1,000 Units by mouth daily.    . dabigatran (PRADAXA) 150 MG CAPS capsule Take 1 capsule (150 mg total) by mouth every 12 (twelve) hours. (Patient taking differently: Take 150 mg by mouth daily. ) 60 capsule 11  . iron polysaccharides (FERREX 150) 150 MG capsule Take 150 mg by mouth daily.    Marland Kitchen PHENobarbital (LUMINAL) 97.2 MG tablet Take 97.2 mg by mouth at bedtime.     . vitamin B-12 (CYANOCOBALAMIN) 500 MCG tablet Take 500 mcg by mouth daily.    . benazepril (LOTENSIN) 5 MG tablet Take 5 mg by mouth daily.    . clindamycin (CLEOCIN) 150 MG capsule Take 2 capsules (300 mg total) by mouth every 6 (six) hours. (Patient not taking: Reported on 06/12/2015) 56 capsule  0  . hydrochlorothiazide (HYDRODIURIL) 25 MG tablet Take 1 tablet (25 mg total) by mouth daily. (Patient not taking: Reported on 06/12/2015) 30 tablet 11  . oxyCODONE-acetaminophen (PERCOCET/ROXICET) 5-325 MG per tablet Take 1-2 tablets by mouth every 6 (six) hours as needed for severe pain. (Patient not taking: Reported on 09/16/2014) 15 tablet 0  . starch (ANUSOL) 51 % suppository Place 1 suppository rectally as needed for pain. (Patient not taking: Reported on 06/12/2015) 24 suppository 0  . tamsulosin (FLOMAX) 0.4 MG CAPS capsule Take 0.4 mg by mouth daily.     No current facility-administered medications for this visit.    No Known Allergies  Review of Systems negative except from HPI and PMH  Physical Exam BP 122/74 mmHg  Pulse 62  Ht  (1.778 m)  Wt 184 lb 6.4 oz (83.643 kg)  BMI 26.46 kg/m2 Well developed and well nourished in no acute distress HENT normal E scleral and icterus clear Neck Supple JVP flat; carotids brisk and full Clear to ausculation Irregularly irregular rate and rhythm, no murmurs gallops or rub Soft with active bowel sounds No clubbing cyanosis 2+ Edema Alert and oriented, grossly normal motor and sensory function Skin Warm and  Dry  ECG demonstrates atrial fibrillation at 62 Intervals-/09/40  Axis -48   Assessment and  Plan Atrial fibrillation   Hypertension  Seizure disorder  Bradycardia  Edema  HFpEF   Will resume diuretic; BP will controlled.  Worry a lttle bit about fall rsik, they will callus if he develops dizziness  Check renal function   May need to adjust dabigitran

## 2015-07-26 NOTE — Patient Instructions (Addendum)
Medication Instructions: - Take HCTZ 12.5 mg by mouth once daily  Labwork: - Your physician recommends that you have lab work today: BMP/ CBC  Procedures/Testing: - none  Follow-Up: - Your physician wants you to follow-up in: 1 year with Dr. Graciela HusbandsKlein. You will receive a reminder letter in the mail two months in advance. If you don't receive a letter, please call our office to schedule the follow-up appointment.  Any Additional Special Instructions Will Be Listed Below (If Applicable).     If you need a refill on your cardiac medications before your next appointment, please call your pharmacy.

## 2015-08-10 ENCOUNTER — Telehealth: Payer: Self-pay | Admitting: Internal Medicine

## 2015-08-10 NOTE — Telephone Encounter (Signed)
New message    Wife calling back stating someone called regarding her husband test results.

## 2015-08-10 NOTE — Telephone Encounter (Signed)
Informed pt's sister, Thurston Holenne, of results. She verbalized understanding.

## 2015-10-11 ENCOUNTER — Emergency Department (HOSPITAL_COMMUNITY): Payer: Medicare HMO

## 2015-10-11 ENCOUNTER — Encounter (HOSPITAL_COMMUNITY): Payer: Self-pay

## 2015-10-11 ENCOUNTER — Emergency Department (HOSPITAL_COMMUNITY)
Admission: EM | Admit: 2015-10-11 | Discharge: 2015-10-11 | Disposition: A | Discharge status: Home / Self Care | Payer: Medicare HMO | Attending: Emergency Medicine | Admitting: Emergency Medicine

## 2015-10-11 DIAGNOSIS — I110 Hypertensive heart disease with heart failure: Secondary | ICD-10-CM | POA: Diagnosis not present

## 2015-10-11 DIAGNOSIS — Y939 Activity, unspecified: Secondary | ICD-10-CM | POA: Diagnosis not present

## 2015-10-11 DIAGNOSIS — I2510 Atherosclerotic heart disease of native coronary artery without angina pectoris: Secondary | ICD-10-CM | POA: Insufficient documentation

## 2015-10-11 DIAGNOSIS — Z79899 Other long term (current) drug therapy: Secondary | ICD-10-CM | POA: Diagnosis not present

## 2015-10-11 DIAGNOSIS — W1830XA Fall on same level, unspecified, initial encounter: Secondary | ICD-10-CM | POA: Diagnosis not present

## 2015-10-11 DIAGNOSIS — F0390 Unspecified dementia without behavioral disturbance: Secondary | ICD-10-CM | POA: Insufficient documentation

## 2015-10-11 DIAGNOSIS — Z87891 Personal history of nicotine dependence: Secondary | ICD-10-CM | POA: Insufficient documentation

## 2015-10-11 DIAGNOSIS — R531 Weakness: Secondary | ICD-10-CM | POA: Diagnosis not present

## 2015-10-11 DIAGNOSIS — Y999 Unspecified external cause status: Secondary | ICD-10-CM | POA: Insufficient documentation

## 2015-10-11 DIAGNOSIS — Y92009 Unspecified place in unspecified non-institutional (private) residence as the place of occurrence of the external cause: Secondary | ICD-10-CM | POA: Diagnosis not present

## 2015-10-11 DIAGNOSIS — I509 Heart failure, unspecified: Secondary | ICD-10-CM | POA: Insufficient documentation

## 2015-10-11 DIAGNOSIS — W19XXXA Unspecified fall, initial encounter: Secondary | ICD-10-CM

## 2015-10-11 DIAGNOSIS — Z7901 Long term (current) use of anticoagulants: Secondary | ICD-10-CM | POA: Insufficient documentation

## 2015-10-11 LAB — CBC WITH DIFFERENTIAL/PLATELET
BASOS ABS: 0 10*3/uL (ref 0.0–0.1)
BASOS PCT: 0 %
Eosinophils Absolute: 0 10*3/uL (ref 0.0–0.7)
Eosinophils Relative: 0 %
HEMATOCRIT: 33.2 % — AB (ref 39.0–52.0)
HEMOGLOBIN: 10.8 g/dL — AB (ref 13.0–17.0)
Lymphocytes Relative: 11 %
Lymphs Abs: 0.5 10*3/uL — ABNORMAL LOW (ref 0.7–4.0)
MCH: 29.3 pg (ref 26.0–34.0)
MCHC: 32.5 g/dL (ref 30.0–36.0)
MCV: 90 fL (ref 78.0–100.0)
MONOS PCT: 7 %
Monocytes Absolute: 0.3 10*3/uL (ref 0.1–1.0)
NEUTROS ABS: 3.9 10*3/uL (ref 1.7–7.7)
NEUTROS PCT: 82 %
Platelets: 125 10*3/uL — ABNORMAL LOW (ref 150–400)
RBC: 3.69 MIL/uL — AB (ref 4.22–5.81)
RDW: 14.1 % (ref 11.5–15.5)
WBC: 4.7 10*3/uL (ref 4.0–10.5)

## 2015-10-11 LAB — COMPREHENSIVE METABOLIC PANEL
ALBUMIN: 3.8 g/dL (ref 3.5–5.0)
ALK PHOS: 84 U/L (ref 38–126)
ALT: 14 U/L — AB (ref 17–63)
AST: 21 U/L (ref 15–41)
Anion gap: 7 (ref 5–15)
BILIRUBIN TOTAL: 0.9 mg/dL (ref 0.3–1.2)
BUN: 18 mg/dL (ref 6–20)
CALCIUM: 9.3 mg/dL (ref 8.9–10.3)
CO2: 22 mmol/L (ref 22–32)
Chloride: 108 mmol/L (ref 101–111)
Creatinine, Ser: 1.19 mg/dL (ref 0.61–1.24)
GFR calc Af Amer: >60 mL/min (ref >60)
GFR calc non Af Amer: 53 mL/min — ABNORMAL LOW (ref >60)
GLUCOSE: 93 mg/dL (ref 65–99)
Potassium: 4 mmol/L (ref 3.5–5.1)
Sodium: 137 mmol/L (ref 135–145)
TOTAL PROTEIN: 6.2 g/dL — AB (ref 6.5–8.1)

## 2015-10-11 LAB — URINALYSIS, ROUTINE W REFLEX MICROSCOPIC
Bilirubin Urine: NEGATIVE
GLUCOSE, UA: NEGATIVE mg/dL
HGB URINE DIPSTICK: NEGATIVE
KETONES UR: NEGATIVE mg/dL
Nitrite: NEGATIVE
PH: 7 (ref 5.0–8.0)
PROTEIN: NEGATIVE mg/dL
Specific Gravity, Urine: 1.008 (ref 1.005–1.030)

## 2015-10-11 LAB — URINE MICROSCOPIC-ADD ON

## 2015-10-11 LAB — BRAIN NATRIURETIC PEPTIDE: B Natriuretic Peptide: 232.1 pg/mL — ABNORMAL HIGH (ref 0.0–100.0)

## 2015-10-11 NOTE — ED Notes (Signed)
Patient comes from [facility] after lowering himself to the floor due to weakness.  Patient was unsteady on his feet.  Patient alert and oriented x4

## 2015-10-11 NOTE — ED Provider Notes (Signed)
CSN: [id number]     Arrival date & time 10/11/15  1547 History  By signing my name below, I, Marisue Humble, attest that this documentation has been prepared under the direction and in the presence of Lavera Guise, MD . Electronically Signed: Marisue Humble, Scribe. 10/11/2015. 4:36 PM.   Chief Complaint  Patient presents with  . Fall  . Weakness     The history is provided by the patient. The history is limited by the condition of the patient. No language interpreter was used.    HPI Comments:  Donald Reynolds is a 82 y.o. male with PMHx of HTN, CAD, seizure disorder, PAF on pradaxa, and dementia who presents to the Emergency Department complaining of weakness in his legs earlier today. He states he feels normal currently and woke up this morning in his usual state of health. Pt states he takes a walking class inside every day and he felt normal during the class this morning. He states his sister put pillows under his feet while he was lying down earlier today; he was getting up to go to the bathroom when the pillow gave out and he fell out of bed to the floor. Then he states he fell while going to the bathroom. Denies syncope, head trauma, fever, nausea, vomiting, diarrhea, cough, chest pain, abdominal pain, difficulty breathing, or any pain currently. Pt is a poor historian.    Past Medical History  Diagnosis Date  . Hypertension   . Coronary artery disease   . Diarrhea     secondary to Salmonella species  . Hypokalemia   . History of seizure disorder   . Nausea & vomiting   . Vitiligo   . Acute renal failure (HCC)   . Deep venous thrombosis (HCC)     Deep venous thrombosis prophylaxis  . Hiatal hernia 2003  . Atrial fibrillation (HCC)   . CHF (congestive heart failure) (HCC)   . Anemia   . Arthritis   . Heart murmur   . Seizures (HCC)   . Enlarged prostate   . Dementia    Past Surgical History  Procedure Laterality Date  . Hernia repair     Family History   Problem Relation Age of Onset  . Stroke Mother   . Hyperlipidemia Mother   . Hypertension Mother   . Heart attack Father   . Kidney Stones Father   . Heart disease Father   . Colon cancer Neg Hx   . Diabetes Other     nephew  . Gout Brother   . Heart disease Brother    Social History  Substance Use Topics  . Smoking status: Former Games developer  . Smokeless tobacco: Never Used  . Alcohol Use: No    Review of Systems  10/14 systems reviewed and are negative other than those stated in the HPI  Allergies  Review of patient's allergies indicates no known allergies.  Home Medications   Prior to Admission medications   Medication Sig Start Date End Date Taking? Authorizing Provider  benazepril (LOTENSIN) 10 MG tablet Take 1 tablet (10 mg total) by mouth daily. 08/05/14   Duke Salvia, MD  cholecalciferol (VITAMIN D) 1000 units tablet Take 1,000 Units by mouth daily.    Historical Provider, MD  dabigatran (PRADAXA) 150 MG CAPS capsule Take 1 capsule (150 mg total) by mouth every 12 (twelve) hours. Patient taking differently: Take 150 mg by mouth daily.  08/05/14   Duke Salvia, MD  hydrochlorothiazide (MICROZIDE)  12.5 MG capsule Take 1 capsule (12.5 mg total) by mouth daily. 07/26/15   Duke SalviaSteven C Klein, MD  iron polysaccharides (FERREX 150) 150 MG capsule Take 150 mg by mouth daily.    Historical Provider, MD  PHENobarbital (LUMINAL) 97.2 MG tablet Take 97.2 mg by mouth at bedtime.     Historical Provider, MD  vitamin B-12 (CYANOCOBALAMIN) 500 MCG tablet Take 500 mcg by mouth daily.    Historical Provider, MD   BP 124/74 mmHg  Pulse 64  Temp(Src) 98.8 F (37.1 C) (Oral)  Resp 24  SpO2 99%   Physical Exam  Physical Exam  Nursing note and vitals reviewed. Constitutional: Well developed, well nourished, non-toxic, and in no acute distress. Head: Normocephalic and atraumatic.  Mouth/Throat: Oropharynx is clear and moist.  Neck: Normal range of motion. Neck supple.   Cardiovascular: Normal rate and regular rhythm.   Pulmonary/Chest: Effort normal and breath sounds normal.  Abdominal: Soft. There is no tenderness. There is no rebound and no guarding.  Musculoskeletal: Normal range of motion. +1 BL pitting edema. Neurological: Alert, no facial droop, fluent speech, moves all extremities symmetrically. No dysmetria with finger to nose, sensation to light touch intact throughout. Skin: Skin is warm and dry.  Psychiatric: Cooperative  ED Course  Procedures  DIAGNOSTIC STUDIES:  Oxygen Saturation is 99% on RA, normal by my interpretation.    COORDINATION OF CARE:  4:30 PM Will order CBC, CMP, UA, chest x-ray and head CT. Discussed treatment plan with pt at bedside and pt agreed to plan.  Labs Review Labs Reviewed  CBC WITH DIFFERENTIAL/PLATELET - Abnormal; Notable for the following:    RBC 3.69 (*)    Hemoglobin 10.8 (*)    HCT 33.2 (*)    Platelets 125 (*)    Lymphs Abs 0.5 (*)    All other components within normal limits  COMPREHENSIVE METABOLIC PANEL - Abnormal; Notable for the following:    Total Protein 6.2 (*)    ALT 14 (*)    GFR calc non Af Amer 53 (*)    All other components within normal limits  URINALYSIS, ROUTINE W REFLEX MICROSCOPIC (NOT AT [facility]) - Abnormal; Notable for the following:    Leukocytes, UA SMALL (*)    All other components within normal limits  BRAIN NATRIURETIC PEPTIDE - Abnormal; Notable for the following:    B Natriuretic Peptide 232.1 (*)    All other components within normal limits  URINE MICROSCOPIC-ADD ON - Abnormal; Notable for the following:    Squamous Epithelial / LPF 0-5 (*)    Bacteria, UA RARE (*)    All other components within normal limits    Imaging Review Dg Chest 2 View  10/11/2015  CLINICAL DATA:  Weakness. The patient had to subtly lower and self to the floor due to sudden onset of weakness. No chest pain. EXAM: CHEST  2 VIEW COMPARISON:  Two-view chest x-ray 03/17/2014 FINDINGS: Cardiac  enlargement is stable. Atherosclerotic changes are again noted at the aortic arch. Mild pulmonary vascular congestion is similar to the prior study. There is no frank edema or effusions. Chronic interstitial coarsening is noted. No focal airspace disease is present. Degenerative changes are noted in the thoracic spine and at the left shoulder. IMPRESSION: 1. Cardiomegaly and mild edema suggesting congestive heart failure. 2. No focal airspace disease. 3. Degenerative changes are worse in the left shoulder. Electronically Signed   By: Marin Robertshristopher  Mattern M.D.   On: 10/11/2015 17:21   Ct Head  Wo Contrast  10/11/2015  CLINICAL DATA:  Fall at home today. Weakness and unsteady gait. Initial encounter. EXAM: CT HEAD WITHOUT CONTRAST TECHNIQUE: Contiguous axial images were obtained from the base of the skull through the vertex without intravenous contrast. COMPARISON:  03/18/2014 FINDINGS: There is no evidence of intracranial hemorrhage, brain edema, or other signs of acute infarction. There is no evidence of intracranial mass lesion or mass effect. No abnormal extraaxial fluid collections are identified. Mild diffuse cerebral atrophy and chronic small vessel disease are stable in appearance. No evidence of hydrocephalus. No skull fracture or other bone lesions identified. IMPRESSION: No acute intracranial findings. Stable mild cerebral atrophy and chronic small vessel disease. Electronically Signed   By: Myles RosenthalJohn  Stahl M.D.   On: 10/11/2015 18:34   I have personally reviewed and evaluated these images and lab results as part of my medical decision-making.   EKG Interpretation None      MDM   Final diagnoses:  Weakness  Fall    I personally performed the services described in this documentation, which was scribed in my presence. The recorded information has been reviewed and is accurate.   82 year old male who presents with episode of generalized weakness and fall. States he is at baseline currently on  my evaluation. Is well appearing. Vitals normal. No evidence of trauma on exam. Exam otherwise unremarkable and nonfocal. CT head normal. No major electrolyte or metabolic derangements. CXR with infiltrate. No UTI on UA. He is able to ambulate steadily and states resolved symptoms. Felt appropriate for discharge home. Strict return and follow-up instructions reviewed. He expressed understanding of all discharge instructions and felt comfortable with the plan of care.    Lavera Guiseana Duo Anikin Prosser, MD 10/11/15 2325

## 2015-10-11 NOTE — ED Notes (Signed)
Pt ambulated in hall with walker, no difficulty. Steady gait noted. EDP aware

## 2015-10-11 NOTE — ED Notes (Signed)
Patient transported to CT 

## 2015-10-11 NOTE — Discharge Instructions (Signed)
Return for worsening symptoms, including fever, confusion, inability to walk, worsening weakness or frequent falls or any other symptoms concerning to you.  Weakness Weakness is a lack of strength. You may feel weak all over your body or just in one part of your body. Weakness can be serious. In some cases, you may need more medical tests. HOME CARE  Rest.  Eat a well-balanced diet.  Try to exercise every day.  Only take medicines as told by your doctor. GET HELP RIGHT AWAY IF:   You cannot do your normal daily activities.  You cannot walk up and down stairs, or you feel very tired when you do so.  You have shortness of breath or chest pain.  You have trouble moving parts of your body.  You have weakness in only one body part or on only one side of the body.  You have a fever.  You have trouble speaking or swallowing.  You cannot control when you pee (urinate) or poop (bowel movement).  You have black or bloody throw up (vomit) or poop.  Your weakness gets worse or spreads to other body parts.  You have new aches or pains. MAKE SURE YOU:   Understand these instructions.  Will watch your condition.  Will get help right away if you are not doing well or get worse.   This information is not intended to replace advice given to you by your health care provider. Make sure you discuss any questions you have with your health care provider.   Document Released: 03/15/2008 Document Revised: 10/02/2011 Document Reviewed: 06/01/2011 Elsevier Interactive Patient Education [employer]2016 Elsevier Inc.

## 2015-10-11 NOTE — ED Notes (Signed)
Called main lab to add on BNP 

## 2015-11-26 ENCOUNTER — Emergency Department (HOSPITAL_COMMUNITY)
Admission: EM | Admit: 2015-11-26 | Discharge: 2015-11-27 | Disposition: A | Discharge status: Home / Self Care | Payer: Medicare HMO | Attending: Emergency Medicine | Admitting: Emergency Medicine

## 2015-11-26 ENCOUNTER — Emergency Department (HOSPITAL_COMMUNITY): Payer: Medicare HMO

## 2015-11-26 ENCOUNTER — Encounter (HOSPITAL_COMMUNITY): Payer: Self-pay

## 2015-11-26 DIAGNOSIS — R609 Edema, unspecified: Secondary | ICD-10-CM | POA: Diagnosis not present

## 2015-11-26 DIAGNOSIS — Z87891 Personal history of nicotine dependence: Secondary | ICD-10-CM | POA: Insufficient documentation

## 2015-11-26 DIAGNOSIS — Z7901 Long term (current) use of anticoagulants: Secondary | ICD-10-CM | POA: Diagnosis not present

## 2015-11-26 DIAGNOSIS — R531 Weakness: Secondary | ICD-10-CM | POA: Diagnosis not present

## 2015-11-26 DIAGNOSIS — I2510 Atherosclerotic heart disease of native coronary artery without angina pectoris: Secondary | ICD-10-CM | POA: Insufficient documentation

## 2015-11-26 DIAGNOSIS — M7989 Other specified soft tissue disorders: Secondary | ICD-10-CM | POA: Diagnosis present

## 2015-11-26 DIAGNOSIS — I509 Heart failure, unspecified: Secondary | ICD-10-CM | POA: Diagnosis not present

## 2015-11-26 DIAGNOSIS — I110 Hypertensive heart disease with heart failure: Secondary | ICD-10-CM | POA: Diagnosis not present

## 2015-11-26 LAB — BRAIN NATRIURETIC PEPTIDE: B NATRIURETIC PEPTIDE 5: 334.3 pg/mL — AB (ref 0.0–100.0)

## 2015-11-26 LAB — CBC
HEMATOCRIT: 34.8 % — AB (ref 39.0–52.0)
Hemoglobin: 11.5 g/dL — ABNORMAL LOW (ref 13.0–17.0)
MCH: 30.1 pg (ref 26.0–34.0)
MCHC: 33 g/dL (ref 30.0–36.0)
MCV: 91.1 fL (ref 78.0–100.0)
PLATELETS: 113 10*3/uL — AB (ref 150–400)
RBC: 3.82 MIL/uL — ABNORMAL LOW (ref 4.22–5.81)
RDW: 14.1 % (ref 11.5–15.5)
WBC: 3.2 10*3/uL — AB (ref 4.0–10.5)

## 2015-11-26 LAB — COMPREHENSIVE METABOLIC PANEL
ALT: 14 U/L — ABNORMAL LOW (ref 17–63)
ANION GAP: 7 (ref 5–15)
AST: 19 U/L (ref 15–41)
Albumin: 3.8 g/dL (ref 3.5–5.0)
Alkaline Phosphatase: 76 U/L (ref 38–126)
BILIRUBIN TOTAL: 0.5 mg/dL (ref 0.3–1.2)
BUN: 23 mg/dL — ABNORMAL HIGH (ref 6–20)
CHLORIDE: 105 mmol/L (ref 101–111)
CO2: 26 mmol/L (ref 22–32)
Calcium: 9.2 mg/dL (ref 8.9–10.3)
Creatinine, Ser: 1.1 mg/dL (ref 0.61–1.24)
GFR, EST NON AFRICAN AMERICAN: 58 mL/min — AB (ref >60)
Glucose, Bld: 84 mg/dL (ref 65–99)
POTASSIUM: 4 mmol/L (ref 3.5–5.1)
Sodium: 138 mmol/L (ref 135–145)
TOTAL PROTEIN: 6.1 g/dL — AB (ref 6.5–8.1)

## 2015-11-26 LAB — TROPONIN I: Troponin I: <0.03 ng/mL (ref <0.03)

## 2015-11-26 NOTE — ED Notes (Signed)
Spoke with Daphine DeutscherAnne Wheller to confirm address and inform of planned discharge of patient. Thurston Holenne reported to this RN that she could no longer care for her brother(the patient) and that was a large part of the reason for her having him brought to the hospital. MD Denton LankSteinl informed of sisters concerns and MD spoke with sister via phone. At this time sending patient home may be unsafe. Patient remains in ED at this time.

## 2015-11-26 NOTE — ED Triage Notes (Signed)
Pt. Arrived with report from EMS of increased weakness, unsteady gait and increased bilateral lower extremity swelling over 1 week.  Pt. Denies any chest pain or sob.  Pt. Is a poor historian.  Pt. Is alert and oriented X4

## 2015-11-26 NOTE — ED Notes (Signed)
Pt stated he had an "accident this morning". Pt was taken to bathroom and found to be incontinent of urine and BM. Pt cleaned by EMT and Heriberto AntiguaLori RN.

## 2015-11-26 NOTE — ED Provider Notes (Addendum)
[facility]-EMERGENCY DEPT Provider Note   CSN: [id number] Arrival date & time: 11/26/15  1620  First Provider Contact:  First MD Initiated Contact with Patient 11/26/15 1632        History   Chief Complaint Chief Complaint  Patient presents with  . Leg Swelling    HPI Donald Reynolds is a 82 y.o. male.  Patient with hx afib, chf, cad, dementia, persents via ems with c/o generalized weakness, and bilateral foot/ankle swelling in the past 1-2 weeks. Patient is a very poor historian, who at times appears to want to have conversation about a variety of subjects, rather than discuss or answer questions related to current symptoms.  Denies chest pain or discomfort. No headache. No unilateral numbness or weakness. No apparent change in baseline functional ability. Indicates compliant w meds.  No fever or chills.      The history is provided by the patient and the EMS personnel.    Past Medical History:  Diagnosis Date  . Acute renal failure (HCC)   . Anemia   . Arthritis   . Atrial fibrillation (HCC)   . CHF (congestive heart failure) (HCC)   . Coronary artery disease   . Deep venous thrombosis (HCC)    Deep venous thrombosis prophylaxis  . Dementia   . Diarrhea    secondary to Salmonella species  . Enlarged prostate   . Heart murmur   . Hiatal hernia 2003  . History of seizure disorder   . Hypertension   . Hypokalemia   . Nausea & vomiting   . Seizures (HCC)   . Vitiligo     Patient Active Problem List   Diagnosis Date Noted  . Elevated TSH 03/20/2014  . Cellulitis 03/17/2014  . Dementia 03/17/2014  . Debility 03/17/2014  . Hematuria 03/17/2014  . Bradycardia 03/17/2014  . WEIGHT LOSS 02/28/2010  . URINALYSIS, ABNORMAL 12/30/2009  . BENIGN PROSTATIC HYPERTROPHY, WITH URINARY OBSTRUCTION 06/29/2009  . HYDROCELE, RIGHT 06/29/2009  . INCOMPLETE VOIDING 06/29/2009  . Atrial fibrillation (HCC) 04/14/2009  . PROSTATE SPECIFIC ANTIGEN, ELEVATED 12/30/2008  .  SALMONELLA INFECTION 10/21/2008  . ABNORMALITY OF GAIT 10/21/2008  . FREQUENCY, URINARY 10/21/2008  . CARIES, DENTAL, UNSPECIFIED 02/03/2007  . GINGIVITIS, CHRONIC, NON-PLAQUE INDUCED 02/03/2007  . HYPERLIPIDEMIA 08/13/2006  . ANEMIA, B12 DEFICIENCY 08/13/2006  . ANEMIA-NOS 08/13/2006  . Essential hypertension 08/13/2006  . GERD 08/13/2006  . VITILIGO 08/13/2006  . Seizure disorder (HCC) 08/13/2006    Past Surgical History:  Procedure Laterality Date  . HERNIA REPAIR         Home Medications    Prior to Admission medications   Medication Sig Start Date End Date Taking? Authorizing Provider  benazepril (LOTENSIN) 10 MG tablet Take 1 tablet (10 mg total) by mouth daily. 08/05/14  Yes Duke Salvia, MD  cholecalciferol (VITAMIN D) 1000 units tablet Take 1,000 Units by mouth daily.   Yes Historical Provider, MD  dabigatran (PRADAXA) 150 MG CAPS capsule Take 1 capsule (150 mg total) by mouth every 12 (twelve) hours. Patient taking differently: Take 150 mg by mouth daily.  08/05/14  Yes Duke Salvia, MD  iron polysaccharides (FERREX 150) 150 MG capsule Take 150 mg by mouth daily.   Yes Historical Provider, MD  PHENobarbital (LUMINAL) 97.2 MG tablet Take 97.2 mg by mouth every morning.    Yes Historical Provider, MD  vitamin B-12 (CYANOCOBALAMIN) 500 MCG tablet Take 500 mcg by mouth daily.   Yes Historical Provider, MD  hydrochlorothiazide (MICROZIDE)  12.5 MG capsule Take 1 capsule (12.5 mg total) by mouth daily. Patient not taking: Reported on 11/26/2015 07/26/15   Duke SalviaSteven C Klein, MD    Family History Family History  Problem Relation Age of Onset  . Stroke Mother   . Hyperlipidemia Mother   . Hypertension Mother   . Heart attack Father   . Kidney Stones Father   . Heart disease Father   . Gout Brother   . Heart disease Brother   . Diabetes Other     nephew  . Colon cancer Neg Hx     Social History Social History  Substance Use Topics  . Smoking status: Former Games developermoker    . Smokeless tobacco: Never Used  . Alcohol use No     Allergies   Review of patient's allergies indicates no known allergies.   Review of Systems Review of Systems  Constitutional: Negative for fever.  HENT: Negative for sore throat.   Eyes: Negative for visual disturbance.  Respiratory: Negative for shortness of breath.   Cardiovascular: Negative for chest pain.  Gastrointestinal: Negative for abdominal pain, diarrhea and vomiting.  Genitourinary: Negative for dysuria and flank pain.  Musculoskeletal: Negative for back pain and neck pain.  Skin: Negative for rash.  Neurological: Negative for numbness and headaches.  Hematological: Does not bruise/bleed easily.  Psychiatric/Behavioral: The patient is not nervous/anxious.      Physical Exam Updated Vital Signs BP 152/94   Pulse (!) 32   Temp 97.4 F (36.3 C) (Oral)   Resp 14   Ht 6' 0.5" (1.842 m)   Wt 80.4 kg   SpO2 (!) 82%   BMI 23.71 kg/m   Physical Exam  Constitutional: He is oriented to person, place, and time. He appears well-developed and well-nourished. No distress.  HENT:  Head: Atraumatic.  Mouth/Throat: Oropharynx is clear and moist.  Eyes: Conjunctivae are normal. Pupils are equal, round, and reactive to light.  Neck: Neck supple. No tracheal deviation present.  Cardiovascular: Normal rate, regular rhythm, normal heart sounds and intact distal pulses.  Exam reveals no gallop and no friction rub.   No murmur heard. Pulmonary/Chest: Effort normal and breath sounds normal. No accessory muscle usage. No respiratory distress.  Abdominal: Soft. Bowel sounds are normal. He exhibits no distension. There is no tenderness.  Genitourinary:  Genitourinary Comments: No cva tenderness  Musculoskeletal:  Mild bilateral foot and ankle edema.   Neurological: He is alert and oriented to person, place, and time.  Skin: Skin is warm and dry. He is not diaphoretic. No erythema.  Psychiatric: He has a normal mood and  affect.  Nursing note and vitals reviewed.    ED Treatments / Results  Labs (all labs ordered are listed, but only abnormal results are displayed) Results for orders placed or performed during the hospital encounter of 11/26/15  CBC  Result Value Ref Range   WBC 3.2 (L) 4.0 - 10.5 K/uL   RBC 3.82 (L) 4.22 - 5.81 MIL/uL   Hemoglobin 11.5 (L) 13.0 - 17.0 g/dL   HCT 16.134.8 (L) 09.639.0 - 04.552.0 %   MCV 91.1 78.0 - 100.0 fL   MCH 30.1 26.0 - 34.0 pg   MCHC 33.0 30.0 - 36.0 g/dL   RDW 40.914.1 81.111.5 - 91.415.5 %   Platelets 113 (L) 150 - 400 K/uL  Comprehensive metabolic panel  Result Value Ref Range   Sodium 138 135 - 145 mmol/L   Potassium 4.0 3.5 - 5.1 mmol/L   Chloride 105 101 -  111 mmol/L   CO2 26 22 - 32 mmol/L   Glucose, Bld 84 65 - 99 mg/dL   BUN 23 (H) 6 - 20 mg/dL   Creatinine, Ser 1.61 0.61 - 1.24 mg/dL   Calcium 9.2 8.9 - 09.6 mg/dL   Total Protein 6.1 (L) 6.5 - 8.1 g/dL   Albumin 3.8 3.5 - 5.0 g/dL   AST 19 15 - 41 U/L   ALT 14 (L) 17 - 63 U/L   Alkaline Phosphatase 76 38 - 126 U/L   Total Bilirubin 0.5 0.3 - 1.2 mg/dL   GFR calc non Af Amer 58 (L) >60 mL/min   GFR calc Af Amer >60 >60 mL/min   Anion gap 7 5 - 15  Brain natriuretic peptide  Result Value Ref Range   B Natriuretic Peptide 334.3 (H) 0.0 - 100.0 pg/mL  Troponin I  Result Value Ref Range   Troponin I <0.03 <0.03 ng/mL   Dg Chest 2 View  Result Date: 11/26/2015 CLINICAL DATA:  Pt c/o left leg swelling x 1 day. No chest complaints. Hx of AFIB, CHF, CAD, HTN, AND heart murmur. Pt is a nonsmoker. EXAM: CHEST  2 VIEW COMPARISON:  10/11/2015 FINDINGS: Cardiac silhouette is mildly enlarged. No mediastinal or hilar masses. No convincing adenopathy. There are increased interstitial markings in the lungs. No evidence of pneumonia or pulmonary edema. No pleural effusion or pneumothorax. Skeletal structures are demineralized but intact. No change from the prior study. IMPRESSION: No acute cardiopulmonary disease.  Stable  cardiomegaly. Electronically Signed   By: Amie Portland M.D.   On: 11/26/2015 18:11    EKG  EKG Interpretation  Date/Time:  Saturday November 26 2015 16:52:45 EDT Ventricular Rate:  68 PR Interval:    QRS Duration: 95 QT Interval:  420 QTC Calculation: 437 R Axis:   0 Text Interpretation:  Atrial fibrillation Low voltage, extremity leads Nonspecific T wave abnormality No significant change since last tracing Confirmed by Denton Lank  MD, Caryn Bee ([id number]) on 11/26/2015 4:55:08 PM       Radiology Dg Chest 2 View  Result Date: 11/26/2015 CLINICAL DATA:  Pt c/o left leg swelling x 1 day. No chest complaints. Hx of AFIB, CHF, CAD, HTN, AND heart murmur. Pt is a nonsmoker. EXAM: CHEST  2 VIEW COMPARISON:  10/11/2015 FINDINGS: Cardiac silhouette is mildly enlarged. No mediastinal or hilar masses. No convincing adenopathy. There are increased interstitial markings in the lungs. No evidence of pneumonia or pulmonary edema. No pleural effusion or pneumothorax. Skeletal structures are demineralized but intact. No change from the prior study. IMPRESSION: No acute cardiopulmonary disease.  Stable cardiomegaly. Electronically Signed   By: Amie Portland M.D.   On: 11/26/2015 18:11    Procedures Procedures (including critical care time)  Medications Ordered in ED Medications - No data to display   Initial Impression / Assessment and Plan / ED Course  I have reviewed the triage vital signs and the nursing notes.  Pertinent labs & imaging results that were available during my care of the patient were reviewed by me and considered in my medical decision making (see chart for details).  Clinical Course    Labs. Cxr.  Reviewed nursing notes and prior charts for additional history.   Recheck, patient eating/drinking, and appears in no acute distress.   No cp or sob. Afeb.  No new c/o.    Patient currently appears stable for d/c.   Rec close pcp f/u.   Upon d/c, rn called pts home to  facilitate pt  back to home - family member indicates her main reason for sending patient to ED was to place him in ECF, she denies acute or abrupt change in condition, but states gradual decline over months/years, and that she is in 36s, and has trouble giving him adequate assistance at home.  No CM in ED. SW consulted.   Will make home health referral, including sw, pt, ot, rn.   Patient does have pcp who can also assist with ecf placement.     Final Clinical Impressions(s) / ED Diagnoses   Final diagnoses:  None    New Prescriptions New Prescriptions   No medications on file         Cathren Laine, MD 11/26/15 2023

## 2015-11-26 NOTE — Discharge Instructions (Signed)
It was our pleasure to provide your ER care today - we hope that you feel better.  Rest. Drink adequate fluids.  Limit salt intake. Elevate legs as need to help with mild swelling.  Follow up with your primary care doctor in the next few days - call office this Monday to arrange appointment.  Return to ER if worse, new symptoms, fevers, chest pain, trouble breathing, other concern.

## 2015-11-26 NOTE — ED Notes (Signed)
Spoke with Donald DeutscherAnne Reynolds regarding plan of care and discharge. Informed patient of MD Steinl's plan to have home health come see patient and consult for social work which  Has been placed. Family member agreeable but disappointed with plan. Ms. Donald Reynolds continues to request that we keep patient in hospital and attempt to find placement for him from there. Address confirmed by family member. [facility] contacted for transport.

## 2015-11-27 NOTE — ED Notes (Signed)
Patient given instruction about condom catheter use and emptying. Verbalized understanding.

## 2015-12-15 ENCOUNTER — Telehealth: Payer: Self-pay | Admitting: Internal Medicine

## 2015-12-15 NOTE — Telephone Encounter (Signed)
I called and spoke with the patient's sister. She reports that the patient is having swelling to his left leg/ losing weight/ "going down hill." [facility]since [facility]). He was seen in the ER on 8/12 for similar symptoms.  Per Ms. Neuwirth, she is concerned about the patient's current condition with his swelling, although she does not report any acute symptoms or signs of distress that the patient is having.  He does have history of a-fib and is on Pradaxa.  She is requesting cardiology follow up for the patient. I advised her that Dr. Odessa FlemingKlein's schedule is currently booked out, but offered a follow up with the PA/ NP- she is agreeable. I advised her I will have Dr. Odessa FlemingKlein's scheduler give her a call to arrange a follow up appt with a NP/ PA and call her back. [facility] but does not have a follow up until about a month out.

## 2015-12-15 NOTE — Telephone Encounter (Signed)
New message   Pt sister verbalized that pt has swelling and he is in a facility  Pt c/o swelling: STAT is pt has developed SOB within 24 hours  1. How long have you been experiencing swelling? 2 or 3 weeks  2. Where is the swelling located?  Left foot and up around his chest  3.  Are you currently taking a "fluid pill"? yes  4.  Are you currently SOB? no  5.  Have you traveled recently? no

## 2016-01-10 ENCOUNTER — Other Ambulatory Visit: Payer: Self-pay | Admitting: Cardiology

## 2016-01-10 DIAGNOSIS — I872 Venous insufficiency (chronic) (peripheral): Secondary | ICD-10-CM

## 2016-02-07 ENCOUNTER — Ambulatory Visit
Admission: RE | Admit: 2016-02-07 | Discharge: 2016-02-07 | Disposition: A | Discharge status: Home / Self Care | Payer: Medicare HMO | Source: Ambulatory Visit | Attending: Cardiology | Admitting: Cardiology

## 2016-02-07 DIAGNOSIS — I872 Venous insufficiency (chronic) (peripheral): Secondary | ICD-10-CM

## 2016-02-07 NOTE — Consult Note (Signed)
Chief Complaint: Patient was seen in consultation today for  Chief Complaint  Patient presents with  . Venous Insufficiency    Consult   at the request of Ganji,Jay  Referring Physician(s): Ganji,Jay  History of Present Illness: Donald Reynolds is a 82 y.o. male with history of chronic diastolic heart failure, chronic atrial fibrillation and hypertension. The patient is being evaluated for lower extremity edema. Patient was accompanied by his sister. Patient says that the lower extremity edema has improved since he started wearing bilateral compression stockings. The patient has knee-high 15-20 mmHg compression stockings. Patient does not complain of any pain. Patient denies any bleeding or ulcerations. Patient is able to ambulate with a walker.  Past Medical History:  Diagnosis Date  . Acute renal failure (HCC)   . Anemia   . Arthritis   . Atrial fibrillation (HCC)   . CHF (congestive heart failure) (HCC)   . Coronary artery disease   . Deep venous thrombosis (HCC)    Deep venous thrombosis prophylaxis  . Dementia   . Diarrhea    secondary to Salmonella species  . Enlarged prostate   . Heart murmur   . Hiatal hernia 2003  . History of seizure disorder   . Hypertension   . Hypokalemia   . Nausea & vomiting   . Seizures (HCC)   . Vitiligo     Past Surgical History:  Procedure Laterality Date  . HERNIA REPAIR      Allergies: Review of patient's allergies indicates no known allergies.  Medications: Prior to Admission medications   Medication Sig Start Date End Date Taking? Authorizing Provider  benazepril (LOTENSIN) 10 MG tablet Take 1 tablet (10 mg total) by mouth daily. 08/05/14  Yes Duke Salvia, MD  cholecalciferol (VITAMIN D) 1000 units tablet Take 1,000 Units by mouth daily.   Yes Historical Provider, MD  dabigatran (PRADAXA) 150 MG CAPS capsule Take 1 capsule (150 mg total) by mouth every 12 (twelve) hours. Patient taking differently: Take 150 mg by  mouth daily.  08/05/14  Yes Duke Salvia, MD  fesoterodine (TOVIAZ) 4 MG TB24 tablet Take 4 mg by mouth daily.   Yes Historical Provider, MD  PHENobarbital (LUMINAL) 97.2 MG tablet Take 97.2 mg by mouth every morning.    Yes Historical Provider, MD  spironolactone (ALDACTONE) 25 MG tablet Take 25 mg by mouth daily.   Yes Historical Provider, MD  tamsulosin (FLOMAX) 0.4 MG CAPS capsule Take 0.4 mg by mouth daily.   Yes Historical Provider, MD  vitamin B-12 (CYANOCOBALAMIN) 500 MCG tablet Take 500 mcg by mouth daily.   Yes Historical Provider, MD  hydrochlorothiazide (MICROZIDE) 12.5 MG capsule Take 1 capsule (12.5 mg total) by mouth daily. Patient not taking: Reported on 02/07/2016 07/26/15   Duke Salvia, MD  iron polysaccharides (FERREX 150) 150 MG capsule Take 150 mg by mouth daily.    Historical Provider, MD     Family History  Problem Relation Age of Onset  . Stroke Mother   . Hyperlipidemia Mother   . Hypertension Mother   . Heart attack Father   . Kidney Stones Father   . Heart disease Father   . Gout Brother   . Heart disease Brother   . Diabetes Other     nephew  . Colon cancer Neg Hx     Social History   Social History  . Marital status: Single    Spouse name: N/A  . Number of children: N/A  .  Years of education: N/A   Social History Main Topics  . Smoking status: Former Games developer  . Smokeless tobacco: Never Used  . Alcohol use No  . Drug use: No  . Sexual activity: No   Other Topics Concern  . Not on file   Social History Narrative  . No narrative on file      Review of Systems  Musculoskeletal:       Bilateral leg swelling. No significant leg pain.     Vital Signs: BP 100/64 (BP Location: Left Arm, Patient Position: Sitting, Cuff Size: Normal)   Pulse 84   Temp 97.4 F (36.3 C) (Oral)   Resp 16   Ht 6' 0.5" (1.842 m)   Wt 177 lb (80.3 kg)   BMI 23.68 kg/m   Physical Exam  Cardiovascular:  Unable to palpable pedal pulses due to edema.    Musculoskeletal: He exhibits edema. He exhibits no tenderness.  Bilateral lower leg edema. Pitting edema in both ankles  No ulcerations. Visible and palpable varicosities in medial right calf.  Skin:  Multiple areas of pigment loss throughout body, including both legs and face.   :     Imaging: US Venous Img Lower Bilateral  Result Date: 02/07/2016 CLINICAL DATA:  82 year old with bilateral lower extremity swelling. Evaluate for venous insufficiency. EXAM: BILATERAL LOWER EXTREMITY VENOUS DUPLEX ULTRASOUND TECHNIQUE: Gray-scale sonography with graded compression, as well as color Doppler and duplex ultrasound, were performed to evaluate the deep and superficial veins of both lower extremities. Spectral Doppler was utilized to evaluate flow at rest and with distal augmentation maneuvers. A complete superficial venous insufficiency exam was performed in the upright standing position. I personally performed the technical portion of the exam. COMPARISON:  None. FINDINGS: Right lower extremity: Deep system: Normal compressibility, augmentation and color Doppler flow in the right common femoral vein, right femoral vein and right popliteal vein. The right saphenofemoral junction is patent. Right profunda femoral vein is patent without thrombus. Superficial system: Normal caliber of the right great saphenous vein in the thigh. There is no reflux in the right thigh great saphenous vein. However, there are multiple varicosities identified in the medial right calf. These varicosities are connected to the right calf great saphenous vein. There is significant reflux in the right calf great saphenous vein measuring greater than 5 seconds. Normal caliber of the right short saphenous vein without reflux. Left lower extremity: Deep system: Normal compressibility, augmentation and color Doppler flow in the left common femoral vein, left femoral vein and left popliteal vein. The left saphenofemoral junction is  patent. Left profunda femoral vein is patent without thrombus. Superficial system: Normal caliber of the left great saphenous vein without reflux. No significant varicosities identified in the left lower extremity. The left short saphenous vein is normal caliber without reflux. IMPRESSION: Positive for superficial venous insufficiency in the right calf. There is reflux in the right calf great saphenous vein with varicosities. No significant superficial venous insufficiency in the left lower extremity. Negative for deep vein thrombosis. Electronically Signed   By: Richarda Overlie M.D.   On: 02/07/2016 13:03    Labs:  CBC:  Recent Labs  07/26/15 0925 10/11/15 1739 11/26/15 1705  WBC 3.8 4.7 3.2*  HGB 12.3* 10.8* 11.5*  HCT 36.2* 33.2* 34.8*  PLT 124* 125* 113*    COAGS: No results for input(s): INR, APTT in the last 8760 hours.  BMP:  Recent Labs  07/26/15 0925 10/11/15 1739 11/26/15 1705  NA 140 137  138  K 4.1 4.0 4.0  CL 105 108 105  CO2 24 22 26   GLUCOSE 90 93 84  BUN 19 18 23*  CALCIUM 9.5 9.3 9.2  CREATININE 1.06 1.19 1.10  GFRNONAA  --  53* 58*  GFRAA  --  >60 >60    LIVER FUNCTION TESTS:  Recent Labs  10/11/15 1739 11/26/15 1705  BILITOT 0.9 0.5  AST 21 19  ALT 14* 14*  ALKPHOS 84 76  PROT 6.2* 6.1*  ALBUMIN 3.8 3.8    TUMOR MARKERS: No results for input(s): AFPTM, CEA, CA199, CHROMGRNA in the last 8760 hours.  Assessment and Plan:  82 year old with bilateral lower extremity edema. Ultrasound demonstrates superficial venous insufficiency in the right calf great saphenous vein with varicosities. Patient does not have point tenderness or pain in the right lower extremity. The venous insufficiency is probably contributing to the swelling in the right leg but according to the patient, swelling is actually worse in the left lower extremity. Therefore, suspect that the lower extremity edema is more related to the patient's cardiovascular disease rather than venous  insufficiency.  Recommend continuing to wear of the knee-high compression stockings. He seems to be getting symptomatic improvement since he started wearing the stockings. If the patient becomes more symptomatic or develops pain in the right calf, we could consider treatment of these varicosities and the right great saphenous vein.  Thank you for this interesting consult.  I greatly enjoyed meeting Donald Reynolds and look forward to participating in their care.  A copy of this report was sent to the requesting provider on this date.  Electronically Signed: Abundio MiuHENN, Meosha Castanon RYAN 02/07/2016, 1:31 PM   I spent a total of  15 Minutes   in face to face in clinical consultation, greater than 50% of which was counseling/coordinating care for leg swelling.

## 2016-03-04 ENCOUNTER — Encounter (HOSPITAL_COMMUNITY): Payer: Self-pay | Admitting: *Deleted

## 2016-03-04 ENCOUNTER — Emergency Department (HOSPITAL_COMMUNITY)
Admission: EM | Admit: 2016-03-04 | Discharge: 2016-03-04 | Disposition: A | Discharge status: Home / Self Care | Payer: Medicare HMO | Attending: Emergency Medicine | Admitting: Emergency Medicine

## 2016-03-04 DIAGNOSIS — I2510 Atherosclerotic heart disease of native coronary artery without angina pectoris: Secondary | ICD-10-CM | POA: Diagnosis not present

## 2016-03-04 DIAGNOSIS — R531 Weakness: Secondary | ICD-10-CM | POA: Diagnosis not present

## 2016-03-04 DIAGNOSIS — I110 Hypertensive heart disease with heart failure: Secondary | ICD-10-CM | POA: Insufficient documentation

## 2016-03-04 DIAGNOSIS — G479 Sleep disorder, unspecified: Secondary | ICD-10-CM | POA: Diagnosis not present

## 2016-03-04 DIAGNOSIS — I509 Heart failure, unspecified: Secondary | ICD-10-CM | POA: Diagnosis not present

## 2016-03-04 DIAGNOSIS — Z87891 Personal history of nicotine dependence: Secondary | ICD-10-CM | POA: Insufficient documentation

## 2016-03-04 DIAGNOSIS — R400 Somnolence: Secondary | ICD-10-CM

## 2016-03-04 LAB — BASIC METABOLIC PANEL
Anion gap: 7 (ref 5–15)
BUN: 23 mg/dL — ABNORMAL HIGH (ref 6–20)
CO2: 26 mmol/L (ref 22–32)
Calcium: 9.1 mg/dL (ref 8.9–10.3)
Chloride: 105 mmol/L (ref 101–111)
Creatinine, Ser: 1.17 mg/dL (ref 0.61–1.24)
GFR calc Af Amer: >60 mL/min (ref >60)
GFR calc non Af Amer: 54 mL/min — ABNORMAL LOW (ref >60)
Glucose, Bld: 162 mg/dL — ABNORMAL HIGH (ref 65–99)
Potassium: 4 mmol/L (ref 3.5–5.1)
Sodium: 138 mmol/L (ref 135–145)

## 2016-03-04 LAB — URINALYSIS, ROUTINE W REFLEX MICROSCOPIC
Bilirubin Urine: NEGATIVE
Glucose, UA: NEGATIVE mg/dL
Hgb urine dipstick: NEGATIVE
Ketones, ur: NEGATIVE mg/dL
Leukocytes, UA: NEGATIVE
Nitrite: NEGATIVE
Protein, ur: NEGATIVE mg/dL
Specific Gravity, Urine: 1.021 (ref 1.005–1.030)
pH: 6 (ref 5.0–8.0)

## 2016-03-04 LAB — CBC WITH DIFFERENTIAL/PLATELET
Basophils Absolute: 0 10*3/uL (ref 0.0–0.1)
Basophils Relative: 0 %
Eosinophils Absolute: 0 10*3/uL (ref 0.0–0.7)
Eosinophils Relative: 0 %
HCT: 33.7 % — ABNORMAL LOW (ref 39.0–52.0)
Hemoglobin: 11.7 g/dL — ABNORMAL LOW (ref 13.0–17.0)
Lymphocytes Relative: 3 %
Lymphs Abs: 0.4 10*3/uL — ABNORMAL LOW (ref 0.7–4.0)
MCH: 31.1 pg (ref 26.0–34.0)
MCHC: 34.7 g/dL (ref 30.0–36.0)
MCV: 89.6 fL (ref 78.0–100.0)
Monocytes Absolute: 0.4 10*3/uL (ref 0.1–1.0)
Monocytes Relative: 3 %
Neutro Abs: 12.5 10*3/uL — ABNORMAL HIGH (ref 1.7–7.7)
Neutrophils Relative %: 94 %
Platelets: 132 10*3/uL — ABNORMAL LOW (ref 150–400)
RBC: 3.76 MIL/uL — ABNORMAL LOW (ref 4.22–5.81)
RDW: 14.5 % (ref 11.5–15.5)
WBC: 13.3 10*3/uL — ABNORMAL HIGH (ref 4.0–10.5)

## 2016-03-04 NOTE — ED Provider Notes (Signed)
Medical screening examination/treatment/procedure(s) were conducted as a shared visit with non-physician practitioner(s) and myself.  I personally evaluated the patient during the encounter.   EKG Interpretation None      82 year old male who presents with "confusion." History of hypertension, atrial fibrillation on pradaxa, and CAD. Was found napping at church today and his pastor tried to wake him up. He was reportedly very groggy while they were waking him up and EMS was subsequently called. The patient states he has not slept for past 3 night because his roommate has been keeping him up. States that he remembers getting woken up by his pastor today, and had tried to get up but was unsteady. States that he feels like his normal self now. Denies chest pain, syncope or near syncope, dyspnea, fever or chills, n/v/d or urinary complaints. Able to ambulate steadily in ED. Exam otherwise unremarkable and non focal. UA unremarkable. No s/s of infection. No major electrolyte or metabolic derangements. Given he is baseline and able to ambulate, he is felt stable for discharge home.    Lavera Guiseana Duo Kalyani Maeda, MD 03/04/16 [phone]

## 2016-03-04 NOTE — Discharge Instructions (Signed)
Return here as needed.  Follow-up with your primary care doctor.  Your chest here today did not show any significant abnormality

## 2016-03-04 NOTE — ED Notes (Signed)
Bed: WA05 Expected date:  Expected time:  Means of arrival:  Comments: EMS 

## 2016-03-04 NOTE — ED Triage Notes (Signed)
Per EMS report[facility]  Pt was at church and was found to be sleeping.  People around pt attempted to wake him up and he was hard to arouse.  When pt was finally arouse, people states he was confused.  On EMS arrival, pt was a/o to his normal, which is oriented x 3.

## 2016-03-13 NOTE — ED Provider Notes (Signed)
[facility]-EMERGENCY DEPT Provider Note   CSN: [id number] Arrival date & time: 03/04/16  1325     History   Chief Complaint No chief complaint on file.   HPI Donald Reynolds is a 82 y.o. male.  HPI Patient presents to the emergency department with sleepiness at church.  The patient states that he was tired because he has a roommate that keeps him up at night.  The patient states that he is feeling no symptoms at this time.  The patient denies chest pain, shortness of breath, headache,blurred vision, neck pain, fever, cough, weakness, numbness, dizziness, anorexia, edema, abdominal pain, nausea, vomiting, diarrhea, rash, back pain, dysuria, hematemesis, bloody stool, near syncope, or syncope. Past Medical History:  Diagnosis Date  . Acute renal failure (HCC)   . Anemia   . Arthritis   . Atrial fibrillation (HCC)   . CHF (congestive heart failure) (HCC)   . Coronary artery disease   . Deep venous thrombosis (HCC)    Deep venous thrombosis prophylaxis  . Dementia   . Diarrhea    secondary to Salmonella species  . Enlarged prostate   . Heart murmur   . Hiatal hernia 2003  . History of seizure disorder   . Hypertension   . Hypokalemia   . Nausea & vomiting   . Seizures (HCC)   . Vitiligo     Patient Active Problem List   Diagnosis Date Noted  . Elevated TSH 03/20/2014  . Cellulitis 03/17/2014  . Dementia 03/17/2014  . Debility 03/17/2014  . Hematuria 03/17/2014  . Bradycardia 03/17/2014  . WEIGHT LOSS 02/28/2010  . URINALYSIS, ABNORMAL 12/30/2009  . BENIGN PROSTATIC HYPERTROPHY, WITH URINARY OBSTRUCTION 06/29/2009  . HYDROCELE, RIGHT 06/29/2009  . INCOMPLETE VOIDING 06/29/2009  . Atrial fibrillation (HCC) 04/14/2009  . PROSTATE SPECIFIC ANTIGEN, ELEVATED 12/30/2008  . SALMONELLA INFECTION 10/21/2008  . ABNORMALITY OF GAIT 10/21/2008  . FREQUENCY, URINARY 10/21/2008  . CARIES, DENTAL, UNSPECIFIED 02/03/2007  . GINGIVITIS, CHRONIC, NON-PLAQUE INDUCED 02/03/2007  .  HYPERLIPIDEMIA 08/13/2006  . ANEMIA, B12 DEFICIENCY 08/13/2006  . ANEMIA-NOS 08/13/2006  . Essential hypertension 08/13/2006  . GERD 08/13/2006  . VITILIGO 08/13/2006  . Seizure disorder (HCC) 08/13/2006    Past Surgical History:  Procedure Laterality Date  . HERNIA REPAIR         Home Medications    Prior to Admission medications   Medication Sig Start Date End Date Taking? Authorizing Provider  benazepril-hydrochlorthiazide (LOTENSIN HCT) 10-12.5 MG tablet Take 1 tablet by mouth daily.   Yes Historical Provider, MD  cholecalciferol (VITAMIN D) 1000 units tablet Take 1,000 Units by mouth daily.   Yes Historical Provider, MD  dabigatran (PRADAXA) 150 MG CAPS capsule Take 1 capsule (150 mg total) by mouth every 12 (twelve) hours. Patient taking differently: Take 150 mg by mouth 2 (two) times daily.  08/05/14  Yes Duke Salvia, MD  fesoterodine (TOVIAZ) 4 MG TB24 tablet Take 4 mg by mouth daily.   Yes Historical Provider, MD  iron polysaccharides (FERREX 150) 150 MG capsule Take 150 mg by mouth daily.   Yes Historical Provider, MD  oxybutynin (DITROPAN-XL) 5 MG 24 hr tablet Take 5 mg by mouth daily. 02/23/16  Yes Historical Provider, MD  PHENobarbital (LUMINAL) 97.2 MG tablet Take 97.2 mg by mouth every morning.    Yes Historical Provider, MD  spironolactone (ALDACTONE) 25 MG tablet Take 25 mg by mouth daily.   Yes Historical Provider, MD  tamsulosin (FLOMAX) 0.4 MG CAPS capsule Take 0.4  mg by mouth daily.   Yes Historical Provider, MD  vitamin B-12 (CYANOCOBALAMIN) 500 MCG tablet Take 500 mcg by mouth daily.   Yes Historical Provider, MD  hydrochlorothiazide (MICROZIDE) 12.5 MG capsule Take 1 capsule (12.5 mg total) by mouth daily. Patient not taking: Reported on 02/07/2016 07/26/15   Duke SalviaSteven C Klein, MD    Family History Family History  Problem Relation Age of Onset  . Stroke Mother   . Hyperlipidemia Mother   . Hypertension Mother   . Heart attack Father   . Kidney Stones  Father   . Heart disease Father   . Gout Brother   . Heart disease Brother   . Diabetes Other     nephew  . Colon cancer Neg Hx     Social History Social History  Substance Use Topics  . Smoking status: Former Games developermoker  . Smokeless tobacco: Never Used  . Alcohol use No     Allergies   Patient has no known allergies.   Review of Systems Review of Systems All other systems negative except as documented in the HPI. All pertinent positives and negatives as reviewed in the HPI.   Physical Exam Updated Vital Signs BP 104/59 (BP Location: Left Arm)   Pulse 79   Temp 98.3 F (36.8 C) (Oral)   Resp 16   SpO2 96%   Physical Exam  Constitutional: He is oriented to person, place, and time. He appears well-developed and well-nourished. No distress.  HENT:  Head: Normocephalic and atraumatic.  Mouth/Throat: Oropharynx is clear and moist.  Eyes: Pupils are equal, round, and reactive to light.  Neck: Normal range of motion. Neck supple.  Cardiovascular: Normal rate, regular rhythm and normal heart sounds.  Exam reveals no gallop and no friction rub.   No murmur heard. Pulmonary/Chest: Effort normal and breath sounds normal. No respiratory distress. He has no wheezes.  Abdominal: Soft. Bowel sounds are normal. He exhibits no distension. There is no tenderness.  Neurological: He is alert and oriented to person, place, and time. He exhibits normal muscle tone. Coordination normal.  Skin: Skin is warm and dry. No rash noted. No erythema.  Psychiatric: He has a normal mood and affect. His behavior is normal.  Nursing note and vitals reviewed.    ED Treatments / Results  Labs (all labs ordered are listed, but only abnormal results are displayed) Labs Reviewed  BASIC METABOLIC PANEL - Abnormal; Notable for the following:       Result Value   Glucose, Bld 162 (*)    BUN 23 (*)    GFR calc non Af Amer 54 (*)    All other components within normal limits  CBC WITH  DIFFERENTIAL/PLATELET - Abnormal; Notable for the following:    WBC 13.3 (*)    RBC 3.76 (*)    Hemoglobin 11.7 (*)    HCT 33.7 (*)    Platelets 132 (*)    Neutro Abs 12.5 (*)    Lymphs Abs 0.4 (*)    All other components within normal limits  URINALYSIS, ROUTINE W REFLEX MICROSCOPIC (NOT AT [facility]) - Abnormal; Notable for the following:    Color, Urine AMBER (*)    All other components within normal limits    EKG  EKG Interpretation None       Radiology No results found.  Procedures Procedures (including critical care time)  Medications Ordered in ED Medications - No data to display   Initial Impression / Assessment and Plan / ED  Course  I have reviewed the triage vital signs and the nursing notes.  Pertinent labs & imaging results that were available during my care of the patient were reviewed by me and considered in my medical decision making (see chart for details).  Clinical Course     Patient is stable and did not feel that he had a syncopal event, feel that he is sleeping at church patient be discharged back to his assisted living.  Told to return here as needed.  Patient is advised follow-up with his primary care doctor  Final Clinical Impressions(s) / ED Diagnoses   Final diagnoses:  Sleepiness  Weakness    New Prescriptions Discharge Medication List as of 03/04/2016  6:16 PM       Charlestine Nighthristopher Tanishi Nault, PA-C 03/13/16 [id number]    Lavera Guiseana Duo Liu, MD 03/15/16 2009

## 2016-06-22 ENCOUNTER — Encounter (HOSPITAL_COMMUNITY): Payer: Self-pay | Admitting: Emergency Medicine

## 2016-06-22 ENCOUNTER — Emergency Department (HOSPITAL_COMMUNITY): Payer: Medicare HMO

## 2016-06-22 ENCOUNTER — Emergency Department (HOSPITAL_COMMUNITY)
Admission: EM | Admit: 2016-06-22 | Discharge: 2016-06-23 | Disposition: A | Discharge status: Home / Self Care | Payer: Medicare HMO | Attending: Emergency Medicine | Admitting: Emergency Medicine

## 2016-06-22 DIAGNOSIS — Z87891 Personal history of nicotine dependence: Secondary | ICD-10-CM | POA: Diagnosis not present

## 2016-06-22 DIAGNOSIS — Z79899 Other long term (current) drug therapy: Secondary | ICD-10-CM | POA: Insufficient documentation

## 2016-06-22 DIAGNOSIS — Y939 Activity, unspecified: Secondary | ICD-10-CM | POA: Diagnosis not present

## 2016-06-22 DIAGNOSIS — S0990XA Unspecified injury of head, initial encounter: Secondary | ICD-10-CM

## 2016-06-22 DIAGNOSIS — Y999 Unspecified external cause status: Secondary | ICD-10-CM | POA: Insufficient documentation

## 2016-06-22 DIAGNOSIS — I2510 Atherosclerotic heart disease of native coronary artery without angina pectoris: Secondary | ICD-10-CM | POA: Insufficient documentation

## 2016-06-22 DIAGNOSIS — I10 Essential (primary) hypertension: Secondary | ICD-10-CM | POA: Insufficient documentation

## 2016-06-22 DIAGNOSIS — Y92009 Unspecified place in unspecified non-institutional (private) residence as the place of occurrence of the external cause: Secondary | ICD-10-CM

## 2016-06-22 DIAGNOSIS — Y929 Unspecified place or not applicable: Secondary | ICD-10-CM | POA: Insufficient documentation

## 2016-06-22 DIAGNOSIS — W19XXXA Unspecified fall, initial encounter: Secondary | ICD-10-CM | POA: Insufficient documentation

## 2016-06-22 NOTE — ED Triage Notes (Signed)
Per EMS, patient from [facility], had unwitnessed fall over walker, c/o laceration to inside of top right lip. Denies head injury and LOC. A&Ox3. Ambulatory with EMS. Patient reports he does not take blood thinners.

## 2016-06-22 NOTE — ED Provider Notes (Signed)
[facility]-EMERGENCY DEPT Provider Note   CSN: [id number] Arrival date & time: 06/22/16  1859   By signing my name below, I, Talbert Nan, attest that this documentation has been prepared under the direction and in the presence of [employer], PA-C. Electronically Signed: Talbert Nan, Scribe. 06/22/16. 10:57 PM.    History   Chief Complaint Chief Complaint  Patient presents with  . Fall    HPI Donald Reynolds, a patient from [facility], is a 83 y.o. male with h/o dementia, HTN, HLD brought in by ambulance, who presents to the Emergency Department complaining of a laceration to the inside of top right lip s/p fall from standing to the ground. The fall was unwitnessed from standing at his walker to the ground. Pt states that he is not in any pain at this point in time. Pt reports that he does not take blood thinners. Pt denies LOC, nose bleed, hip or leg pain, neck pain, head injury.    The history is provided by the patient. No language interpreter was used.    Past Medical History:  Diagnosis Date  . Acute renal failure (HCC)   . Anemia   . Arthritis   . Atrial fibrillation (HCC)   . CHF (congestive heart failure) (HCC)   . Coronary artery disease   . Deep venous thrombosis (HCC)    Deep venous thrombosis prophylaxis  . Dementia   . Diarrhea    secondary to Salmonella species  . Enlarged prostate   . Heart murmur   . Hiatal hernia 2003  . History of seizure disorder   . Hypertension   . Hypokalemia   . Nausea & vomiting   . Seizures (HCC)   . Vitiligo     Patient Active Problem List   Diagnosis Date Noted  . Elevated TSH 03/20/2014  . Cellulitis 03/17/2014  . Dementia 03/17/2014  . Debility 03/17/2014  . Hematuria 03/17/2014  . Bradycardia 03/17/2014  . WEIGHT LOSS 02/28/2010  . URINALYSIS, ABNORMAL 12/30/2009  . BENIGN PROSTATIC HYPERTROPHY, WITH URINARY OBSTRUCTION 06/29/2009  . HYDROCELE, RIGHT 06/29/2009  . INCOMPLETE VOIDING 06/29/2009  . Atrial  fibrillation (HCC) 04/14/2009  . PROSTATE SPECIFIC ANTIGEN, ELEVATED 12/30/2008  . SALMONELLA INFECTION 10/21/2008  . ABNORMALITY OF GAIT 10/21/2008  . FREQUENCY, URINARY 10/21/2008  . CARIES, DENTAL, UNSPECIFIED 02/03/2007  . GINGIVITIS, CHRONIC, NON-PLAQUE INDUCED 02/03/2007  . HYPERLIPIDEMIA 08/13/2006  . ANEMIA, B12 DEFICIENCY 08/13/2006  . ANEMIA-NOS 08/13/2006  . Essential hypertension 08/13/2006  . GERD 08/13/2006  . VITILIGO 08/13/2006  . Seizure disorder (HCC) 08/13/2006    Past Surgical History:  Procedure Laterality Date  . HERNIA REPAIR         Home Medications    Prior to Admission medications   Medication Sig Start Date End Date Taking? Authorizing Provider  benazepril-hydrochlorthiazide (LOTENSIN HCT) 10-12.5 MG tablet Take 1 tablet by mouth daily.    Historical Provider, MD  cholecalciferol (VITAMIN D) 1000 units tablet Take 1,000 Units by mouth daily.    Historical Provider, MD  dabigatran (PRADAXA) 150 MG CAPS capsule Take 1 capsule (150 mg total) by mouth every 12 (twelve) hours. Patient taking differently: Take 150 mg by mouth 2 (two) times daily.  08/05/14   Duke Salvia, MD  fesoterodine (TOVIAZ) 4 MG TB24 tablet Take 4 mg by mouth daily.    Historical Provider, MD  hydrochlorothiazide (MICROZIDE) 12.5 MG capsule Take 1 capsule (12.5 mg total) by mouth daily. Patient not taking: Reported on 02/07/2016 07/26/15  Duke Salvia, MD  iron polysaccharides (FERREX 150) 150 MG capsule Take 150 mg by mouth daily.    Historical Provider, MD  oxybutynin (DITROPAN-XL) 5 MG 24 hr tablet Take 5 mg by mouth daily. 02/23/16   Historical Provider, MD  PHENobarbital (LUMINAL) 97.2 MG tablet Take 97.2 mg by mouth every morning.     Historical Provider, MD  spironolactone (ALDACTONE) 25 MG tablet Take 25 mg by mouth daily.    Historical Provider, MD  tamsulosin (FLOMAX) 0.4 MG CAPS capsule Take 0.4 mg by mouth daily.    Historical Provider, MD  vitamin B-12  (CYANOCOBALAMIN) 500 MCG tablet Take 500 mcg by mouth daily.    Historical Provider, MD    Family History Family History  Problem Relation Age of Onset  . Stroke Mother   . Hyperlipidemia Mother   . Hypertension Mother   . Heart attack Father   . Kidney Stones Father   . Heart disease Father   . Gout Brother   . Heart disease Brother   . Diabetes Other     nephew  . Colon cancer Neg Hx     Social History Social History  Substance Use Topics  . Smoking status: Former Games developer  . Smokeless tobacco: Never Used  . Alcohol use No     Allergies   Patient has no known allergies.   Review of Systems Review of Systems  HENT: Negative for nosebleeds.   Musculoskeletal: Negative for arthralgias, back pain, myalgias and neck pain.  Skin: Positive for wound.  Neurological: Negative for headaches.   A complete 10 system review of systems was obtained and all systems are negative except as noted in the HPI and PMH.    Physical Exam Updated Vital Signs BP 155/93 (BP Location: Left Arm)   Pulse 76   Temp 98.3 F (36.8 C) (Oral)   Resp 18   SpO2 100%   Physical Exam  Constitutional: He is oriented to person, place, and time. No distress.  HENT:  Head: Normocephalic and atraumatic.  Mouth/Throat: Oropharynx is clear and moist.  No external lacerations. No bony tenderness of the neck.  Eyes: Conjunctivae and EOM are normal. Pupils are equal, round, and reactive to light.  Neck: Normal range of motion. Neck supple.  Cardiovascular: Normal rate, regular rhythm and normal heart sounds.  Exam reveals no gallop and no friction rub.   No murmur heard. Pulmonary/Chest: Effort normal and breath sounds normal. No respiratory distress. He has no wheezes.  Abdominal: Soft. Bowel sounds are normal. He exhibits no distension. There is no tenderness.  Musculoskeletal:  No hip pain.  Neurological: He is alert and oriented to person, place, and time. He exhibits normal muscle tone.  Coordination normal.  Skin: Skin is warm and dry. Capillary refill takes less than 2 seconds. No rash noted. No erythema.  Psychiatric: He has a normal mood and affect. His behavior is normal.  Nursing note and vitals reviewed.    ED Treatments / Results   DIAGNOSTIC STUDIES: Oxygen Saturation is 100% on room air, normal by my interpretation.    COORDINATION OF CARE: 10:30 PM Discussed treatment plan with pt at bedside and pt agreed to plan, which include XRs.    Labs (all labs ordered are listed, but only abnormal results are displayed) Labs Reviewed - No data to display  EKG  EKG Interpretation None       Radiology No results found.  Procedures Procedures (including critical care time)  Medications Ordered  in ED Medications - No data to display   Initial Impression / Assessment and Plan / ED Course  I have reviewed the triage vital signs and the nursing notes.  Pertinent labs & imaging results that were available during my care of the patient were reviewed by me and considered in my medical decision making (see chart for details).     Patient has a normal neurological exam.  The CT scans were negative as well.  Told to return here as needed.  Patient agrees the plan and all questions were answered  Final Clinical Impressions(s) / ED Diagnoses   Final diagnoses:  None    New Prescriptions New Prescriptions   No medications on file   I personally performed the services described in this documentation, which was scribed in my presence. The recorded information has been reviewed and is accurate.   Charlestine Night, PA-C 06/23/16 1610    Raeford Razor, MD 07/03/16 [phone]

## 2016-06-22 NOTE — ED Notes (Signed)
Patient called three times with no answer. 

## 2016-06-23 NOTE — Discharge Instructions (Signed)
Follow up with your PCP. Return here as needed.

## 2016-10-18 ENCOUNTER — Emergency Department (HOSPITAL_COMMUNITY)
Admission: EM | Admit: 2016-10-18 | Discharge: 2016-10-18 | Disposition: A | Discharge status: Home / Self Care | Payer: Medicare HMO | Attending: Emergency Medicine | Admitting: Emergency Medicine

## 2016-10-18 ENCOUNTER — Emergency Department (HOSPITAL_COMMUNITY): Payer: Medicare HMO

## 2016-10-18 DIAGNOSIS — W07XXXA Fall from chair, initial encounter: Secondary | ICD-10-CM | POA: Diagnosis not present

## 2016-10-18 DIAGNOSIS — S0990XA Unspecified injury of head, initial encounter: Secondary | ICD-10-CM | POA: Diagnosis present

## 2016-10-18 DIAGNOSIS — Z79899 Other long term (current) drug therapy: Secondary | ICD-10-CM | POA: Diagnosis not present

## 2016-10-18 DIAGNOSIS — Y92199 Unspecified place in other specified residential institution as the place of occurrence of the external cause: Secondary | ICD-10-CM | POA: Diagnosis not present

## 2016-10-18 DIAGNOSIS — Y9389 Activity, other specified: Secondary | ICD-10-CM | POA: Diagnosis not present

## 2016-10-18 DIAGNOSIS — I110 Hypertensive heart disease with heart failure: Secondary | ICD-10-CM | POA: Diagnosis not present

## 2016-10-18 DIAGNOSIS — I2510 Atherosclerotic heart disease of native coronary artery without angina pectoris: Secondary | ICD-10-CM | POA: Diagnosis not present

## 2016-10-18 DIAGNOSIS — Z87891 Personal history of nicotine dependence: Secondary | ICD-10-CM | POA: Diagnosis not present

## 2016-10-18 DIAGNOSIS — I509 Heart failure, unspecified: Secondary | ICD-10-CM | POA: Diagnosis not present

## 2016-10-18 DIAGNOSIS — Y999 Unspecified external cause status: Secondary | ICD-10-CM | POA: Diagnosis not present

## 2016-10-18 DIAGNOSIS — W19XXXA Unspecified fall, initial encounter: Secondary | ICD-10-CM

## 2016-10-18 LAB — CBG MONITORING, ED: GLUCOSE-CAPILLARY: 67 mg/dL (ref 65–99)

## 2016-10-18 NOTE — ED Provider Notes (Signed)
[facility]ERG[facility]Y DEPT Provider Note   CSN: [id number] Arrival date & time: 10/18/16  1105     History   Chief Complaint Chief Complaint  Patient presents with  . Fall    HPI Donald Reynolds is a 83 y.o. male.  HPI level V caveat dementia history is obtained from EMS. Patient was at an adult daycare center today and he fell asleep and fell off of a chair striking his head. Patient denies pain anywhere. He was treated with hard cervical collar by EMS.  Past Medical History:  Diagnosis Date  . Acute renal failure (HCC)   . Anemia   . Arthritis   . Atrial fibrillation (HCC)   . CHF (congestive heart failure) (HCC)   . Coronary artery disease   . Deep venous thrombosis (HCC)    Deep venous thrombosis prophylaxis  . Dementia   . Diarrhea    secondary to Salmonella species  . Enlarged prostate   . Heart murmur   . Hiatal hernia 2003  . History of seizure disorder   . Hypertension   . Hypokalemia   . Nausea & vomiting   . Seizures (HCC)   . Vitiligo     Patient Active Problem List   Diagnosis Date Noted  . Elevated TSH 03/20/2014  . Cellulitis 03/17/2014  . Dementia 03/17/2014  . Debility 03/17/2014  . Hematuria 03/17/2014  . Bradycardia 03/17/2014  . WEIGHT LOSS 02/28/2010  . URINALYSIS, ABNORMAL 12/30/2009  . BENIGN PROSTATIC HYPERTROPHY, WITH URINARY OBSTRUCTION 06/29/2009  . HYDROCELE, RIGHT 06/29/2009  . INCOMPLETE VOIDING 06/29/2009  . Atrial fibrillation (HCC) 04/14/2009  . PROSTATE SPECIFIC ANTIGEN, ELEVATED 12/30/2008  . SALMONELLA INFECTION 10/21/2008  . ABNORMALITY OF GAIT 10/21/2008  . FREQUENCY, URINARY 10/21/2008  . CARIES, DENTAL, UNSPECIFIED 02/03/2007  . GINGIVITIS, CHRONIC, NON-PLAQUE INDUCED 02/03/2007  . HYPERLIPIDEMIA 08/13/2006  . ANEMIA, B12 DEFICIENCY 08/13/2006  . ANEMIA-NOS 08/13/2006  . Essential hypertension 08/13/2006  . GERD 08/13/2006  . VITILIGO 08/13/2006  . Seizure disorder (HCC) 08/13/2006    Past Surgical History:    Procedure Laterality Date  . HERNIA REPAIR         Home Medications    Prior to Admission medications   Medication Sig Start Date End Date Taking? Authorizing Provider  cholecalciferol (VITAMIN D) 1000 units tablet Take 1,000 Units by mouth every morning.     [provider]  dabigatran (PRADAXA) 150 MG CAPS capsule Take 1 capsule (150 mg total) by mouth every 12 (twelve) hours. Patient taking differently: Take 150 mg by mouth 2 (two) times daily.  08/05/14   Duke Salvia, MD  fesoterodine (TOVIAZ) 4 MG TB24 tablet Take 4 mg by mouth every morning.     [provider]  hydrochlorothiazide (MICROZIDE) 12.5 MG capsule Take 1 capsule (12.5 mg total) by mouth daily. Patient not taking: Reported on 02/07/2016 07/26/15   Duke Salvia, MD  PHENobarbital (LUMINAL) 97.2 MG tablet Take 97.2 mg by mouth every morning.     [provider]  tamsulosin (FLOMAX) 0.4 MG CAPS capsule Take 0.4 mg by mouth daily after breakfast.     [provider]  vitamin B-12 (CYANOCOBALAMIN) 500 MCG tablet Take 500 mcg by mouth every morning.     [provider]    Family History Family History  Problem Relation Age of Onset  . Stroke Mother   . Hyperlipidemia Mother   . Hypertension Mother   . Heart attack Father   . Kidney Stones Father   .  Heart disease Father   . Gout Brother   . Heart disease Brother   . Diabetes Other        nephew  . Colon cancer Neg Hx     Social History Social History  Substance Use Topics  . Smoking status: Former Games developer  . Smokeless tobacco: Never Used  . Alcohol use No     Allergies   Patient has no known allergies.   Review of Systems Review of Systems  Unable to perform ROS: Dementia  Musculoskeletal: Positive for gait problem.       Walks with walker     Physical Exam Updated Vital Signs BP (!) 138/93 (BP Location: Right Arm)   Pulse 76   Temp 98.4 F (36.9 C) (Oral)   Resp 18   SpO2 100%   Physical  Exam  Constitutional: He appears well-developed and well-nourished. No distress.  HENT:  Right Ear: External ear normal.  Left Ear: External ear normal.  Golf ball sized hematoma at right frontotemporal area area otherwise normocephalic atraumatic. Bilateral tympanic membranes normal  Eyes: EOM are normal.  Neck: Neck supple. No tracheal deviation present. No thyromegaly present.  Nontender  Cardiovascular: Normal rate and regular rhythm.   No murmur heard. Pulmonary/Chest: Effort normal and breath sounds normal. He exhibits no tenderness.  Abdominal: Soft. Bowel sounds are normal. He exhibits no distension. There is no tenderness.  Musculoskeletal: Normal range of motion. He exhibits no edema or tenderness.  Tire spine is nontender. Pelvis stable nontender. All 4 extremity is a contusion abrasion or tenderness neurovascularly intact  Neurological: He is alert. Coordination normal.  Motor strength 5 over 5 overall moves all extremities cranial nerves II through XII grossly intact  Skin: Skin is warm and dry. No rash noted.  Psychiatric: He has a normal mood and affect.  Nursing note and vitals reviewed.    ED Treatments / Results  Labs (all labs ordered are listed, but only abnormal results are displayed) Labs Reviewed  CBG MONITORING, ED    EKG  EKG Interpretation None       Radiology No results found.  Procedures Procedures (including critical care time)  Medications Ordered in ED Medications - No data to display Results for orders placed or performed during the hospital encounter of 10/18/16  CBG monitoring, ED  Result Value Ref Range   Glucose-Capillary 67 65 - 99 mg/dL   Ct Head Wo Contrast  Result Date: 10/18/2016 CLINICAL DATA:  Fall from chair EXAM: CT HEAD WITHOUT CONTRAST CT CERVICAL SPINE WITHOUT CONTRAST TECHNIQUE: Multidetector CT imaging of the head and cervical spine was performed following the standard protocol without intravenous contrast.  Multiplanar CT image reconstructions of the cervical spine were also generated. COMPARISON:  06/22/2016 FINDINGS: CT HEAD FINDINGS Brain: Mild atrophic changes are noted. No findings to suggest acute hemorrhage, acute infarction or space-occupying mass lesion is seen. Mild chronic white matter ischemic change is noted. Vascular: No hyperdense vessel or unexpected calcification. Skull: Normal. Negative for fracture or focal lesion. Sinuses/Orbits: No acute finding. Other: Right forehead scalp hematoma is noted. CT CERVICAL SPINE FINDINGS Alignment: Within normal limits. Skull base and vertebrae: 7 cervical segments are well visualized. Vertebral body height is well maintained. Disc space narrowing is noted from C3-T1. Mild degenerative anterolisthesis of C7 on T1 is seen. Facet hypertrophic changes are noted. No acute fracture or facet abnormality is seen. Soft tissues and spinal canal: Bilateral carotid calcifications are noted. No other significant soft tissue abnormality is noted.  Disc levels:  The disc space narrowing as described. Upper chest: Within normal limits. Other: None IMPRESSION: CT of the head: Right forehead scalp hematoma. No acute intracranial abnormality is noted. CT of the cervical spine: Degenerative changes without acute abnormality. Electronically Signed   By: Alcide CleverMark  Lukens M.D.   On: 10/18/2016 13:00   Ct Cervical Spine Wo Contrast  Result Date: 10/18/2016 CLINICAL DATA:  Fall from chair EXAM: CT HEAD WITHOUT CONTRAST CT CERVICAL SPINE WITHOUT CONTRAST TECHNIQUE: Multidetector CT imaging of the head and cervical spine was performed following the standard protocol without intravenous contrast. Multiplanar CT image reconstructions of the cervical spine were also generated. COMPARISON:  06/22/2016 FINDINGS: CT HEAD FINDINGS Brain: Mild atrophic changes are noted. No findings to suggest acute hemorrhage, acute infarction or space-occupying mass lesion is seen. Mild chronic white matter ischemic  change is noted. Vascular: No hyperdense vessel or unexpected calcification. Skull: Normal. Negative for fracture or focal lesion. Sinuses/Orbits: No acute finding. Other: Right forehead scalp hematoma is noted. CT CERVICAL SPINE FINDINGS Alignment: Within normal limits. Skull base and vertebrae: 7 cervical segments are well visualized. Vertebral body height is well maintained. Disc space narrowing is noted from C3-T1. Mild degenerative anterolisthesis of C7 on T1 is seen. Facet hypertrophic changes are noted. No acute fracture or facet abnormality is seen. Soft tissues and spinal canal: Bilateral carotid calcifications are noted. No other significant soft tissue abnormality is noted. Disc levels:  The disc space narrowing as described. Upper chest: Within normal limits. Other: None IMPRESSION: CT of the head: Right forehead scalp hematoma. No acute intracranial abnormality is noted. CT of the cervical spine: Degenerative changes without acute abnormality. Electronically Signed   By: Alcide CleverMark  Lukens M.D.   On: 10/18/2016 13:00    Initial Impression / Assessment and Plan / ED Course  I have reviewed the triage vital signs and the nursing notes.  Pertinent labs & imaging results that were available during my care of the patient were reviewed by me and considered in my medical decision making (see chart for details).     2:45 PM patient is alert and relates with his walker without difficulty. He ate 2 sandwiches while here. He is in no distress. He denies pain anywhere Plan return to home Final Clinical Impressions(s) / ED Diagnoses  Diagnosis #1 fall #2 minor head injury Final diagnoses:  None    New Prescriptions New Prescriptions   No medications on file     Doug SouJacubowitz, Aly Seidenberg, MD 10/18/16 1452

## 2016-10-18 NOTE — ED Notes (Signed)
[facility] Expected date:  Expected time:  Means of arrival:  Comments: held

## 2016-10-18 NOTE — Discharge Instructions (Signed)
CT scans of head and brain and cervical spine showed no brain injury or fracture.

## 2016-10-18 NOTE — ED Notes (Signed)
Bed: [id number] Expected date:  Expected time:  Means of arrival:  Comments: EMS 83 y/o fall

## 2016-10-18 NOTE — ED Triage Notes (Signed)
Pt was sitting in a recliner while sleep and fell forward striking head on floor. Pt presents with hematoma to right scalp.

## 2016-10-23 ENCOUNTER — Emergency Department (HOSPITAL_COMMUNITY)
Admission: EM | Admit: 2016-10-23 | Discharge: 2016-10-23 | Disposition: A | Discharge status: Home / Self Care | Payer: Medicare HMO | Attending: Emergency Medicine | Admitting: Emergency Medicine

## 2016-10-23 ENCOUNTER — Encounter (HOSPITAL_COMMUNITY): Payer: Self-pay | Admitting: Emergency Medicine

## 2016-10-23 DIAGNOSIS — Z7901 Long term (current) use of anticoagulants: Secondary | ICD-10-CM | POA: Insufficient documentation

## 2016-10-23 DIAGNOSIS — I110 Hypertensive heart disease with heart failure: Secondary | ICD-10-CM | POA: Diagnosis not present

## 2016-10-23 DIAGNOSIS — Z87891 Personal history of nicotine dependence: Secondary | ICD-10-CM | POA: Insufficient documentation

## 2016-10-23 DIAGNOSIS — I509 Heart failure, unspecified: Secondary | ICD-10-CM | POA: Diagnosis not present

## 2016-10-23 DIAGNOSIS — I2510 Atherosclerotic heart disease of native coronary artery without angina pectoris: Secondary | ICD-10-CM | POA: Insufficient documentation

## 2016-10-23 DIAGNOSIS — W19XXXA Unspecified fall, initial encounter: Secondary | ICD-10-CM | POA: Insufficient documentation

## 2016-10-23 DIAGNOSIS — Z79899 Other long term (current) drug therapy: Secondary | ICD-10-CM | POA: Diagnosis not present

## 2016-10-23 DIAGNOSIS — Z043 Encounter for examination and observation following other accident: Secondary | ICD-10-CM | POA: Diagnosis present

## 2016-10-23 NOTE — ED Provider Notes (Signed)
[facility]ER[facility] DEPT Provider Note   CSN: [id number] Arrival date & time: 10/23/16  0306     History   Chief Complaint Chief Complaint  Patient presents with  . Fall    HPI Donald Reynolds is a [age] y.o. male.  HPI Patient presents after fall. Reportedly slipped will try to go to the bathroom. Landed on his rear end. Patient currently has no complaints. No head neck or back pain. No loss consciousness. States he would just like to eat breakfast.   Past Medical History:  Diagnosis Date  . Acute renal failure (HCC)   . Anemia   . Arthritis   . Atrial fibrillation (HCC)   . CHF (congestive heart failure) (HCC)   . Coronary artery disease   . Deep venous thrombosis (HCC)    Deep venous thrombosis prophylaxis  . Dementia   . Diarrhea    secondary to Salmonella species  . Enlarged prostate   . Heart murmur   . Hiatal hernia 2003  . History of seizure disorder   . Hypertension   . Hypokalemia   . Nausea & vomiting   . Seizures (HCC)   . Vitiligo     Patient Active Problem List   Diagnosis Date Noted  . Elevated TSH 03/20/2014  . Cellulitis 03/17/2014  . Dementia 03/17/2014  . Debility 03/17/2014  . Hematuria 03/17/2014  . Bradycardia 03/17/2014  . WEIGHT LOSS 02/28/2010  . URINALYSIS, ABNORMAL 12/30/2009  . BENIGN PROSTATIC HYPERTROPHY, WITH URINARY OBSTRUCTION 06/29/2009  . HYDROCELE, RIGHT 06/29/2009  . INCOMPLETE VOIDING 06/29/2009  . Atrial fibrillation (HCC) 04/14/2009  . PROSTATE SPECIFIC ANTIGEN, ELEVATED 12/30/2008  . SALMONELLA INFECTION 10/21/2008  . ABNORMALITY OF GAIT 10/21/2008  . FREQUENCY, URINARY 10/21/2008  . CARIES, DENTAL, UNSPECIFIED 02/03/2007  . GINGIVITIS, CHRONIC, NON-PLAQUE INDUCED 02/03/2007  . HYPERLIPIDEMIA 08/13/2006  . ANEMIA, B12 DEFICIENCY 08/13/2006  . ANEMIA-NOS 08/13/2006  . Essential hypertension 08/13/2006  . GERD 08/13/2006  . VITILIGO 08/13/2006  . Seizure disorder (HCC) 08/13/2006    Past Surgical History:    Procedure Laterality Date  . HERNIA REPAIR         Home Medications    Prior to Admission medications   Medication Sig Start Date End Date Taking? Authorizing Provider  cholecalciferol (VITAMIN D) 1000 units tablet Take 1,000 Units by mouth daily.    Yes [provider]  dabigatran (PRADAXA) 150 MG CAPS capsule Take 1 capsule (150 mg total) by mouth every 12 (twelve) hours. 08/05/14  Yes Duke SalviaKlein, Steven C, MD  fesoterodine (TOVIAZ) 4 MG TB24 tablet Take 4 mg by mouth daily.    Yes [provider]  iron polysaccharides (NIFEREX) 150 MG capsule Take 150 mg by mouth daily.   Yes [provider]  PHENobarbital (LUMINAL) 97.2 MG tablet Take 97.2 mg by mouth daily.    Yes [provider]  tamsulosin (FLOMAX) 0.4 MG CAPS capsule Take 0.4 mg by mouth daily.    Yes [provider]  vitamin B-12 (CYANOCOBALAMIN) 500 MCG tablet Take 500 mcg by mouth daily.    Yes [provider]    Family History Family History  Problem Relation Age of Onset  . Stroke Mother   . Hyperlipidemia Mother   . Hypertension Mother   . Heart attack Father   . Kidney Stones Father   . Heart disease Father   . Gout Brother   . Heart disease Brother   . Diabetes Other        nephew  .  Colon cancer Neg Hx     Social History Social History  Substance Use Topics  . Smoking status: Former Games developer  . Smokeless tobacco: Never Used  . Alcohol use No     Allergies   Patient has no known allergies.   Review of Systems Review of Systems  Unable to perform ROS: Dementia  Musculoskeletal: Negative for back pain and neck pain.  Neurological: Negative for headaches.     Physical Exam Updated Vital Signs BP 123/76 (BP Location: Left Arm)   Pulse 70   Temp 98.2 F (36.8 C)   Resp 15   Ht 6' 0.5" (1.842 m)   Wt 79.4 kg (175 lb)   SpO2 94%   BMI 23.41 kg/m   Physical Exam  Constitutional: He appears well-developed and well-nourished.  HENT:  Bruised  area to right temple area from previous fall  Eyes: Pupils are equal, round, and reactive to light.  Cardiovascular: Normal rate.   Pulmonary/Chest: Effort normal.  Abdominal: Soft.  Musculoskeletal: He exhibits no edema or tenderness.  No cervicothoracic or lumbar tenderness. No tenderness over pelvis. Good range of motion in hips knees and elbows and shoulders.  Neurological: He is alert.  Skin: Skin is warm. Capillary refill takes less than 2 seconds.  Psychiatric: He has a normal mood and affect.     ED Treatments / Results  Labs (all labs ordered are listed, but only abnormal results are displayed) Labs Reviewed - No data to display  EKG  EKG Interpretation None       Radiology No results found.  Procedures Procedures (including critical care time)  Medications Ordered in ED Medications - No data to display   Initial Impression / Assessment and Plan / ED Course  I have reviewed the triage vital signs and the nursing notes.  Pertinent labs & imaging results that were available during my care of the patient were reviewed by me and considered in my medical decision making (see chart for details).     Patient with apparent fall. Has been here for 5 hours. At baseline. No tenderness. No evidence of new head injury. Patient is on anticoagulation. Discharge back to nursing home.  Final Clinical Impressions(s) / ED Diagnoses   Final diagnoses:  Fall, initial encounter    New Prescriptions New Prescriptions   No medications on file     Benjiman Core, MD 10/23/16 [phone]

## 2016-10-23 NOTE — ED Triage Notes (Signed)
Pt presents by [facility] for evaluation of fall from standing that occurred today. EMS advised pt has been having frequent falls. Pt reports pain in back at this time. C-Collar applied by EMS. Pt currently alert to person, and place but confused to time and situation. Prior hx of Dementia. Pt has bruising yellow to purple in color to right temple that was from fall on 10/18/16.

## 2016-10-23 NOTE — ED Notes (Signed)
Patient is alert and oriented to baseline he was given DC instructions with follow up instructions. Patient DC via stretcher to nursing home V/S stable.  he was not showing any signs of distress on DC

## 2016-10-23 NOTE — ED Notes (Signed)
Bed: [id number] Expected date:  Expected time:  Means of arrival:  Comments: EM<S 83 yo male unwitnessed fall-hip and back pain-on blood thinners

## 2017-03-04 ENCOUNTER — Emergency Department (HOSPITAL_COMMUNITY): Payer: Medicare HMO

## 2017-03-04 ENCOUNTER — Other Ambulatory Visit: Payer: Self-pay

## 2017-03-04 ENCOUNTER — Inpatient Hospital Stay (HOSPITAL_COMMUNITY)
Admission: EM | Admit: 2017-03-04 | Discharge: 2017-03-16 | DRG: 870 | Disposition: E | Payer: Medicare HMO | Attending: Internal Medicine | Admitting: Internal Medicine

## 2017-03-04 ENCOUNTER — Encounter (HOSPITAL_COMMUNITY): Payer: Self-pay | Admitting: Emergency Medicine

## 2017-03-04 DIAGNOSIS — Z823 Family history of stroke: Secondary | ICD-10-CM | POA: Diagnosis not present

## 2017-03-04 DIAGNOSIS — G40909 Epilepsy, unspecified, not intractable, without status epilepticus: Secondary | ICD-10-CM | POA: Diagnosis present

## 2017-03-04 DIAGNOSIS — I2510 Atherosclerotic heart disease of native coronary artery without angina pectoris: Secondary | ICD-10-CM | POA: Diagnosis present

## 2017-03-04 DIAGNOSIS — Z8249 Family history of ischemic heart disease and other diseases of the circulatory system: Secondary | ICD-10-CM

## 2017-03-04 DIAGNOSIS — R4182 Altered mental status, unspecified: Secondary | ICD-10-CM | POA: Diagnosis present

## 2017-03-04 DIAGNOSIS — Z86718 Personal history of other venous thrombosis and embolism: Secondary | ICD-10-CM

## 2017-03-04 DIAGNOSIS — R001 Bradycardia, unspecified: Secondary | ICD-10-CM | POA: Diagnosis present

## 2017-03-04 DIAGNOSIS — J15211 Pneumonia due to Methicillin susceptible Staphylococcus aureus: Secondary | ICD-10-CM | POA: Diagnosis present

## 2017-03-04 DIAGNOSIS — R6521 Severe sepsis with septic shock: Secondary | ICD-10-CM | POA: Diagnosis present

## 2017-03-04 DIAGNOSIS — R338 Other retention of urine: Secondary | ICD-10-CM | POA: Diagnosis present

## 2017-03-04 DIAGNOSIS — I110 Hypertensive heart disease with heart failure: Secondary | ICD-10-CM | POA: Diagnosis present

## 2017-03-04 DIAGNOSIS — E785 Hyperlipidemia, unspecified: Secondary | ICD-10-CM | POA: Diagnosis present

## 2017-03-04 DIAGNOSIS — K219 Gastro-esophageal reflux disease without esophagitis: Secondary | ICD-10-CM | POA: Diagnosis present

## 2017-03-04 DIAGNOSIS — Z931 Gastrostomy status: Secondary | ICD-10-CM

## 2017-03-04 DIAGNOSIS — E162 Hypoglycemia, unspecified: Secondary | ICD-10-CM | POA: Diagnosis present

## 2017-03-04 DIAGNOSIS — E8809 Other disorders of plasma-protein metabolism, not elsewhere classified: Secondary | ICD-10-CM

## 2017-03-04 DIAGNOSIS — M1990 Unspecified osteoarthritis, unspecified site: Secondary | ICD-10-CM | POA: Diagnosis present

## 2017-03-04 DIAGNOSIS — L80 Vitiligo: Secondary | ICD-10-CM | POA: Diagnosis present

## 2017-03-04 DIAGNOSIS — I5084 End stage heart failure: Secondary | ICD-10-CM | POA: Diagnosis present

## 2017-03-04 DIAGNOSIS — Z0189 Encounter for other specified special examinations: Secondary | ICD-10-CM

## 2017-03-04 DIAGNOSIS — Z7901 Long term (current) use of anticoagulants: Secondary | ICD-10-CM

## 2017-03-04 DIAGNOSIS — R339 Retention of urine, unspecified: Secondary | ICD-10-CM | POA: Diagnosis not present

## 2017-03-04 DIAGNOSIS — Z01818 Encounter for other preprocedural examination: Secondary | ICD-10-CM

## 2017-03-04 DIAGNOSIS — J9601 Acute respiratory failure with hypoxia: Secondary | ICD-10-CM | POA: Diagnosis present

## 2017-03-04 DIAGNOSIS — Z452 Encounter for adjustment and management of vascular access device: Secondary | ICD-10-CM

## 2017-03-04 DIAGNOSIS — I340 Nonrheumatic mitral (valve) insufficiency: Secondary | ICD-10-CM | POA: Diagnosis not present

## 2017-03-04 DIAGNOSIS — F0390 Unspecified dementia without behavioral disturbance: Secondary | ICD-10-CM | POA: Diagnosis present

## 2017-03-04 DIAGNOSIS — E871 Hypo-osmolality and hyponatremia: Secondary | ICD-10-CM | POA: Diagnosis not present

## 2017-03-04 DIAGNOSIS — M7989 Other specified soft tissue disorders: Secondary | ICD-10-CM | POA: Diagnosis present

## 2017-03-04 DIAGNOSIS — A4101 Sepsis due to Methicillin susceptible Staphylococcus aureus: Principal | ICD-10-CM | POA: Diagnosis present

## 2017-03-04 DIAGNOSIS — N401 Enlarged prostate with lower urinary tract symptoms: Secondary | ICD-10-CM | POA: Diagnosis present

## 2017-03-04 DIAGNOSIS — A419 Sepsis, unspecified organism: Secondary | ICD-10-CM

## 2017-03-04 DIAGNOSIS — E861 Hypovolemia: Secondary | ICD-10-CM | POA: Diagnosis present

## 2017-03-04 DIAGNOSIS — R6889 Other general symptoms and signs: Secondary | ICD-10-CM

## 2017-03-04 DIAGNOSIS — R011 Cardiac murmur, unspecified: Secondary | ICD-10-CM | POA: Diagnosis present

## 2017-03-04 DIAGNOSIS — I4581 Long QT syndrome: Secondary | ICD-10-CM | POA: Diagnosis present

## 2017-03-04 DIAGNOSIS — I5032 Chronic diastolic (congestive) heart failure: Secondary | ICD-10-CM | POA: Diagnosis present

## 2017-03-04 DIAGNOSIS — Z8349 Family history of other endocrine, nutritional and metabolic diseases: Secondary | ICD-10-CM

## 2017-03-04 DIAGNOSIS — Z66 Do not resuscitate: Secondary | ICD-10-CM | POA: Diagnosis not present

## 2017-03-04 DIAGNOSIS — Z4659 Encounter for fitting and adjustment of other gastrointestinal appliance and device: Secondary | ICD-10-CM

## 2017-03-04 DIAGNOSIS — D649 Anemia, unspecified: Secondary | ICD-10-CM | POA: Diagnosis present

## 2017-03-04 DIAGNOSIS — Z515 Encounter for palliative care: Secondary | ICD-10-CM | POA: Diagnosis not present

## 2017-03-04 DIAGNOSIS — D6959 Other secondary thrombocytopenia: Secondary | ICD-10-CM | POA: Diagnosis present

## 2017-03-04 DIAGNOSIS — R609 Edema, unspecified: Secondary | ICD-10-CM | POA: Diagnosis not present

## 2017-03-04 DIAGNOSIS — Z87891 Personal history of nicotine dependence: Secondary | ICD-10-CM

## 2017-03-04 DIAGNOSIS — I4891 Unspecified atrial fibrillation: Secondary | ICD-10-CM | POA: Diagnosis present

## 2017-03-04 DIAGNOSIS — Y95 Nosocomial condition: Secondary | ICD-10-CM | POA: Diagnosis present

## 2017-03-04 DIAGNOSIS — R680 Hypothermia, not associated with low environmental temperature: Secondary | ICD-10-CM | POA: Diagnosis present

## 2017-03-04 DIAGNOSIS — T68XXXA Hypothermia, initial encounter: Secondary | ICD-10-CM

## 2017-03-04 DIAGNOSIS — R601 Generalized edema: Secondary | ICD-10-CM

## 2017-03-04 DIAGNOSIS — J9600 Acute respiratory failure, unspecified whether with hypoxia or hypercapnia: Secondary | ICD-10-CM

## 2017-03-04 DIAGNOSIS — I9589 Other hypotension: Secondary | ICD-10-CM

## 2017-03-04 LAB — COMPREHENSIVE METABOLIC PANEL
ALK PHOS: 110 U/L (ref 38–126)
ALT: 40 U/L (ref 17–63)
AST: 45 U/L — ABNORMAL HIGH (ref 15–41)
Albumin: 2.8 g/dL — ABNORMAL LOW (ref 3.5–5.0)
Anion gap: 6 (ref 5–15)
BUN: 21 mg/dL — ABNORMAL HIGH (ref 6–20)
CALCIUM: 9 mg/dL (ref 8.9–10.3)
CO2: 26 mmol/L (ref 22–32)
CREATININE: 0.74 mg/dL (ref 0.61–1.24)
Chloride: 99 mmol/L — ABNORMAL LOW (ref 101–111)
Glucose, Bld: 66 mg/dL (ref 65–99)
Potassium: 4.1 mmol/L (ref 3.5–5.1)
Sodium: 131 mmol/L — ABNORMAL LOW (ref 135–145)
TOTAL PROTEIN: 5.2 g/dL — AB (ref 6.5–8.1)
Total Bilirubin: 0.6 mg/dL (ref 0.3–1.2)

## 2017-03-04 LAB — URINALYSIS, ROUTINE W REFLEX MICROSCOPIC
BACTERIA UA: NONE SEEN
Bilirubin Urine: NEGATIVE
GLUCOSE, UA: NEGATIVE mg/dL
KETONES UR: NEGATIVE mg/dL
Leukocytes, UA: NEGATIVE
NITRITE: NEGATIVE
PROTEIN: NEGATIVE mg/dL
Specific Gravity, Urine: 1.02 (ref 1.005–1.030)
pH: 5 (ref 5.0–8.0)

## 2017-03-04 LAB — CBC WITH DIFFERENTIAL/PLATELET
BASOS PCT: 0 %
Basophils Absolute: 0 10*3/uL (ref 0.0–0.1)
EOS ABS: 0 10*3/uL (ref 0.0–0.7)
Eosinophils Relative: 0 %
HCT: 28 % — ABNORMAL LOW (ref 39.0–52.0)
HEMOGLOBIN: 9.9 g/dL — AB (ref 13.0–17.0)
Lymphocytes Relative: 3 %
Lymphs Abs: 0.2 10*3/uL — ABNORMAL LOW (ref 0.7–4.0)
MCH: 31.9 pg (ref 26.0–34.0)
MCHC: 35.4 g/dL (ref 30.0–36.0)
MCV: 90.3 fL (ref 78.0–100.0)
MONO ABS: 0.2 10*3/uL (ref 0.1–1.0)
Monocytes Relative: 3 %
NEUTROS PCT: 94 %
Neutro Abs: 7.2 10*3/uL (ref 1.7–7.7)
Platelets: 88 10*3/uL — ABNORMAL LOW (ref 150–400)
RBC: 3.1 MIL/uL — AB (ref 4.22–5.81)
RDW: 15.6 % — ABNORMAL HIGH (ref 11.5–15.5)
WBC: 7.6 10*3/uL (ref 4.0–10.5)

## 2017-03-04 LAB — INFLUENZA PANEL BY PCR (TYPE A & B)
Influenza A By PCR: NEGATIVE
Influenza B By PCR: NEGATIVE

## 2017-03-04 LAB — POC OCCULT BLOOD, ED: FECAL OCCULT BLD: NEGATIVE

## 2017-03-04 LAB — I-STAT TROPONIN, ED: Troponin i, poc: 0.02 ng/mL (ref 0.00–0.08)

## 2017-03-04 LAB — MAGNESIUM: MAGNESIUM: 1.7 mg/dL (ref 1.7–2.4)

## 2017-03-04 LAB — I-STAT CG4 LACTIC ACID, ED: LACTIC ACID, VENOUS: 1.52 mmol/L (ref 0.5–1.9)

## 2017-03-04 LAB — CBG MONITORING, ED: GLUCOSE-CAPILLARY: 84 mg/dL (ref 65–99)

## 2017-03-04 LAB — PROCALCITONIN: PROCALCITONIN: 0.65 ng/mL

## 2017-03-04 LAB — BRAIN NATRIURETIC PEPTIDE: B NATRIURETIC PEPTIDE 5: 300.7 pg/mL — AB (ref 0.0–100.0)

## 2017-03-04 MED ORDER — PIPERACILLIN-TAZOBACTAM 3.375 G IVPB 30 MIN
3.3750 g | Freq: Once | INTRAVENOUS | Status: AC
  Administered 2017-03-04: 3.375 g via INTRAVENOUS
  Filled 2017-03-04: qty 50

## 2017-03-04 MED ORDER — PIPERACILLIN-TAZOBACTAM 3.375 G IVPB
3.3750 g | Freq: Three times a day (TID) | INTRAVENOUS | Status: DC
  Administered 2017-03-05: 3.375 g via INTRAVENOUS
  Filled 2017-03-04 (×2): qty 50

## 2017-03-04 MED ORDER — VANCOMYCIN HCL IN DEXTROSE 1-5 GM/200ML-% IV SOLN
1000.0000 mg | Freq: Once | INTRAVENOUS | Status: AC
  Administered 2017-03-04: 1000 mg via INTRAVENOUS
  Filled 2017-03-04: qty 200

## 2017-03-04 MED ORDER — SODIUM CHLORIDE 0.9 % IV BOLUS (SEPSIS)
500.0000 mL | Freq: Once | INTRAVENOUS | Status: AC
  Administered 2017-03-04: 500 mL via INTRAVENOUS

## 2017-03-04 MED ORDER — PHENYLEPHRINE HCL 10 MG/ML IJ SOLN
0.0000 ug/min | INTRAVENOUS | Status: DC
  Filled 2017-03-04 (×6): qty 1

## 2017-03-04 MED ORDER — SODIUM CHLORIDE 0.9 % IV BOLUS (SEPSIS): 1000.0000 mL | Freq: Once | INTRAVENOUS | Status: DC

## 2017-03-04 MED ORDER — DEXTROSE 5 % IV SOLN
0.0000 ug/min | Freq: Once | INTRAVENOUS | Status: AC
  Administered 2017-03-04: 20 ug/min via INTRAVENOUS
  Filled 2017-03-04 (×2): qty 1

## 2017-03-04 MED ORDER — SODIUM CHLORIDE 0.9 % IV BOLUS (SEPSIS)
1000.0000 mL | Freq: Once | INTRAVENOUS | Status: AC
  Administered 2017-03-04: 1000 mL via INTRAVENOUS

## 2017-03-04 MED ORDER — TAMSULOSIN HCL 0.4 MG PO CAPS
0.4000 mg | ORAL_CAPSULE | Freq: Every day | ORAL | Status: DC
  Filled 2017-03-04: qty 1

## 2017-03-04 MED ORDER — PHENOBARBITAL 32.4 MG PO TABS: 97.2000 mg | ORAL_TABLET | Freq: Every day | ORAL | Status: DC

## 2017-03-04 MED ORDER — VANCOMYCIN HCL IN DEXTROSE 750-5 MG/150ML-% IV SOLN
750.0000 mg | Freq: Two times a day (BID) | INTRAVENOUS | Status: DC
  Administered 2017-03-05: 750 mg via INTRAVENOUS
  Filled 2017-03-04 (×3): qty 150

## 2017-03-04 MED ORDER — HEPARIN SODIUM (PORCINE) 5000 UNIT/ML IJ SOLN
5000.0000 [IU] | Freq: Three times a day (TID) | INTRAMUSCULAR | Status: DC
  Administered 2017-03-04: 5000 [IU] via SUBCUTANEOUS
  Filled 2017-03-04: qty 1

## 2017-03-04 MED ORDER — MAGNESIUM SULFATE 2 GM/50ML IV SOLN
2.0000 g | Freq: Once | INTRAVENOUS | Status: AC
  Administered 2017-03-04: 2 g via INTRAVENOUS
  Filled 2017-03-04: qty 50

## 2017-03-04 MED ORDER — SODIUM CHLORIDE 0.9 % IV SOLN
250.0000 mL | INTRAVENOUS | Status: DC | PRN
  Administered 2017-03-10: 250 mL via INTRAVENOUS

## 2017-03-04 MED ORDER — SODIUM CHLORIDE 0.9 % IV SOLN
Freq: Once | INTRAVENOUS | Status: AC
  Administered 2017-03-04: 18:00:00 via INTRAVENOUS

## 2017-03-04 NOTE — ED Notes (Signed)
Per patient's weight of 79.4kg, pt needs a total of 2382ml o S.  Patient has already been given of NS.

## 2017-03-04 NOTE — ED Provider Notes (Addendum)
Face-to-face evaluation   History: She presents for evaluation of confusion, worse than his usual dementia, by report.  Physical exam: Alert elderly man who answers questions but is confused.  Skin is warm and dry.  Abdomen is soft and nontender.  Perineum with redness consistent with superficial diaper rash.  No evidence for Fournier's gangrene.  Penis scrotum and scrotal contents appear normal.  Left great toe somewhat erythematous in the cuticle region, without evidence for frank ascending infection.  Lower extremities with moderate peripheral edema.   Medical screening examination/treatment/procedure(s) were conducted as a shared visit with non-physician practitioner(s) and myself.  I personally evaluated the patient during the encounter   Clinical Course as of Mar 05 20  Mon Mar 04, 2017  1731 At this time the patient is suspected to have an undifferentiated infection, causing confusion, low blood pressure, and low core temperature.  Initial lactate is normal, which is reassuring.  Patient has been given IV fluid bolus of 500 cc saline. At 17:05 BP 82/54.  Initial lactate is normal.  Chest x-ray concerning for CHF. Will give second IV bolus 500 cc. Empiric ABX have been ordered.  [EW]  1849 Volume Status assessment with the assistance of Dr. Rubin PayorPickering, using U/S. Exam limited d/t bowel gas. Possible small IVC seen, but could not assess for respiration variation in caliber. Therefore, will assume intravascular volume depletion in this patient with persistent hypotension, and give up to 30cc/kg IV saline bolus.  [EW]    Clinical Course User Index [EW] Mancel BaleWentz, Adden Strout, MD    EKG Interpretation  Date/Time:  Monday March 04 2017 15:54:45 EST Ventricular Rate:  93 PR Interval:    QRS Duration: 100 QT Interval:  398 QTC Calculation: 496 R Axis:   -88 Text Interpretation:  Atrial fibrillation Inferior infarct, old since last tracing no significant change Confirmed by Mancel BaleWentz, Shanita Kanan  [phone]) on 04-11-17 9:14:32 PM       EKG Interpretation  Date/Time:  Monday March 04 2017 21:08:15 EST Ventricular Rate:  44 PR Interval:    QRS Duration: 91 QT Interval:  687 QTC Calculation: 588 R Axis:   -106 Text Interpretation:  Atrial fibrillation Low voltage, extremity and precordial leads Consider inferior infarct Nonspecific repol abnormality, diffuse leads Prolonged QT interval Baseline wander in lead(s) V6 Since last tracing rate slower Confirmed by Mancel BaleWentz, Quamel Fitzmaurice [phone]) on 04-11-17 9:16:13 PM      9:25 PM-patient now with bradycardia associated with a long QT on EKG.  Continues to have strong left femoral pulse.  He continues to be alert as per prior.  Systolic blood pressure 89 at this time.  9:34 PM-Neo-Synephrine ordered.  IV access currently 2 peripheral lines.  .Critical Care Performed by: Mancel BaleWentz, Elta Angell, MD Authorized by: Mancel BaleWentz, Bakari Nikolai, MD   Critical care provider statement:    Critical care time (minutes):  85   Critical care start time:  04-11-17 4:45 PM   Critical care end time:  04-11-17 10:15 AM   Critical care time was exclusive of:  Separately billable procedures and treating other patients   Critical care was necessary to treat or prevent imminent or life-threatening deterioration of the following conditions:  Cardiac failure   Critical care was time spent personally by me on the following activities:  Blood draw for specimens, development of treatment plan with patient or surrogate, discussions with consultants, examination of patient, evaluation of patient's response to treatment, obtaining history from patient or surrogate, ordering and performing treatments and interventions, ordering and review of  laboratory studies, ordering and review of radiographic studies, pulse oximetry, re-evaluation of patient's condition and review of old charts   I assumed direction of critical care for this patient from another provider in my specialty: no        Patient Vitals for the past 24 hrs:  BP Temp Temp src Pulse Resp SpO2 Weight  03/05/17 0000 95/67 - - - - 91 % -  03/13/2017 2345 95/64 - - (!) 107 17 (!) 89 % -  02/24/2017 2340 (!) 88/59 - - (!) 122 14 92 % -  03/07/2017 2315 114/73 - - (!) 103 20 97 % -  02/24/2017 2258 103/64 - - 99 - - -  03/12/2017 2251 97/71 - - (!) 109 - - -  03/15/2017 2247 92/74 - - 98 - - -  03/01/2017 2245 (!) 89/71 - - - - - -  03/08/2017 2233 94/70 - - 78 - - -  02/21/2017 2230 91/64 - - (!) 103 - - -  03/13/2017 2230 91/64 - - (!) 103 14 (!) 89 % -  02/16/2017 2228 (!) 89/58 - - (!) 105 - - -  03/08/2017 2225 (!) 84/64 - - 69 - - -  03/01/2017 2222 (!) 89/68 - - (!) 50 - - -  03/15/2017 2221 (!) 86/67 - - - - - -  02/24/2017 2215 (!) 83/55 - - (!) 59 20 90 % -  02/19/2017 2200 (!) 80/61 - - 71 15 (!) 89 % -  03/03/2017 2130 97/83 - - (!) 52 15 90 % -  03/01/2017 2124 (!) 83/60 (!) 94.6 F (34.8 C) Rectal - 18 95 % -  03/03/2017 2115 90/60 - - 78 20 95 % -  02/24/2017 2030 91/75 - - (!) 105 18 97 % -  02/18/2017 1945 92/60 - - (!) 102 15 99 % -  02/26/2017 1942 98/72 (!) 92.1 F (33.4 C) Oral - 12 98 % -  02/20/2017 1915 91/69 - - 81 15 98 % -  02/23/2017 1800 (!) 87/48 - - (!) 45 12 91 % 79.4 kg (175 lb)  03/13/2017 1730 (!) 77/61 - - (!) 108 12 95 % -  03/02/2017 1700 100/69 - - 60 18 (!) 72 % -  03/02/2017 1642 - (!) 91.8 F (33.2 C) Rectal - - - -  02/23/2017 1630 102/74 - - - 12 - -  02/24/2017 1621 - - - 69 - 95 % -  03/08/2017 1621 92/74 - - - - - -  03/09/2017 1601 - (!) 92 F (33.3 C) Rectal - - - -         Mancel BaleWentz, Jerrell Hart, MD 03/05/17 [id number]    Mancel BaleWentz, Britney Newstrom, MD 03/21/17 1004

## 2017-03-04 NOTE — Progress Notes (Signed)
Pharmacy Antibiotic Note  Donald Reynolds is a 83 y.o. male admitted on April 12, 2017 with sepsis.  Pharmacy has been consulted for vancomycin and Zosyn dosing. WBC 7.6, Tmax 92, BP 77/61  Plan: Vancomycin 1000mg  IV x1 then vancomycin 750mg  IV every 12 hours.  Goal trough 15-20 mcg/mL. Zosyn 3.375g IV q8h (4 hour infusion).     Temp (24hrs), Avg:91.9 F (33.3 C), Min:91.8 F (33.2 C), Max:92 F (33.3 C)  Recent Labs  Lab April 20, 2016 1551 April 20, 2016 1613  WBC 7.6  --   CREATININE 0.74  --   LATICACIDVEN  --  1.52    CrCl cannot be calculated (Unknown ideal weight.).    No Known Allergies  Thank you for allowing pharmacy to be a part of this patient's care.  Donald Reynolds L Donald Reynolds April 12, 2017 6:29 PM

## 2017-03-04 NOTE — ED Notes (Signed)
Spoke with SNF, patient is a full code.  Donald Reynolds (sister) is only emergency contact.  Her numbers are in demographics.

## 2017-03-04 NOTE — ED Notes (Signed)
EDP made aware of patient's hypotension.  EDP at bedside with ultrasound.

## 2017-03-04 NOTE — ED Provider Notes (Signed)
[facility] EMERGENCY DEPARTMENT Provider Note   CSN: [id number] Arrival date & time: 03/13/2017  1540     History   Chief Complaint Chief Complaint  Patient presents with  . Facial Swelling    Edema in arms, legs, face    HPI Donald Reynolds is a 83 y.o. male with past medical history significant for CAD, CHF, DVT, A. Fib, hypertension, dementia and seizure disorder pesenting via EMS from [facility] complaining of upper and lower extremity swelling bilaterally and facial swelling with increased confusion. Patient is not currently on a diuretic.   HPI  Past Medical History:  Diagnosis Date  . Acute renal failure (HCC)   . Anemia   . Arthritis   . Atrial fibrillation (HCC)   . CHF (congestive heart failure) (HCC)   . Coronary artery disease   . Deep venous thrombosis (HCC)    Deep venous thrombosis prophylaxis  . Dementia   . Diarrhea    secondary to Salmonella species  . Enlarged prostate   . Heart murmur   . Hiatal hernia 2003  . History of seizure disorder   . Hypertension   . Hypokalemia   . Nausea & vomiting   . Seizures (HCC)   . Vitiligo     Patient Active Problem List   Diagnosis Date Noted  . Sepsis (HCC) 03/05/2017  . Elevated TSH 03/20/2014  . Cellulitis 03/17/2014  . Dementia 03/17/2014  . Debility 03/17/2014  . Hematuria 03/17/2014  . Bradycardia 03/17/2014  . WEIGHT LOSS 02/28/2010  . URINALYSIS, ABNORMAL 12/30/2009  . BENIGN PROSTATIC HYPERTROPHY, WITH URINARY OBSTRUCTION 06/29/2009  . HYDROCELE, RIGHT 06/29/2009  . INCOMPLETE VOIDING 06/29/2009  . Atrial fibrillation (HCC) 04/14/2009  . PROSTATE SPECIFIC ANTIGEN, ELEVATED 12/30/2008  . SALMONELLA INFECTION 10/21/2008  . ABNORMALITY OF GAIT 10/21/2008  . FREQUENCY, URINARY 10/21/2008  . CARIES, DENTAL, UNSPECIFIED 02/03/2007  . GINGIVITIS, CHRONIC, NON-PLAQUE INDUCED 02/03/2007  . HYPERLIPIDEMIA 08/13/2006  . ANEMIA, B12 DEFICIENCY 08/13/2006  . ANEMIA-NOS  08/13/2006  . Essential hypertension 08/13/2006  . GERD 08/13/2006  . VITILIGO 08/13/2006  . Seizure disorder (HCC) 08/13/2006    Past Surgical History:  Procedure Laterality Date  . HERNIA REPAIR         Home Medications    Prior to Admission medications   Medication Sig Start Date End Date Taking? Authorizing Provider  Cholecalciferol (VITAMIN D) 2000 units CAPS Take 2,000 Units daily by mouth.    Yes [provider]  dabigatran (PRADAXA) 150 MG CAPS capsule Take 1 capsule (150 mg total) by mouth every 12 (twelve) hours. 08/05/14  Yes Duke Salvia, MD  fesoterodine (TOVIAZ) 4 MG TB24 tablet Take 4 mg by mouth daily.    Yes [provider]  iron polysaccharides (NIFEREX) 150 MG capsule Take 150 mg by mouth daily.   Yes [provider]  PHENobarbital (LUMINAL) 97.2 MG tablet Take 97.2 mg by mouth daily.    Yes [provider]  tamsulosin (FLOMAX) 0.4 MG CAPS capsule Take 0.4 mg by mouth daily.    Yes [provider]  vitamin B-12 (CYANOCOBALAMIN) 500 MCG tablet Take 500 mcg by mouth daily.    Yes [provider]    Family History Family History  Problem Relation Age of Onset  . Stroke Mother   . Hyperlipidemia Mother   . Hypertension Mother   . Heart attack Father   . Kidney Stones Father   . Heart disease Father   .  Gout Brother   . Heart disease Brother   . Diabetes Other        nephew  . Colon cancer Neg Hx     Social History Social History   Tobacco Use  . Smoking status: Former Games developermoker  . Smokeless tobacco: Never Used  Substance Use Topics  . Alcohol use: No  . Drug use: No     Allergies   Patient has no known allergies.   Review of Systems Review of Systems  Constitutional: Negative for chills.  Respiratory: Negative for chest tightness, shortness of breath, wheezing and stridor.   Cardiovascular: Positive for leg swelling. Negative for chest pain and palpitations.  Musculoskeletal: Negative  for myalgias.  Neurological: Negative for dizziness and headaches.  Psychiatric/Behavioral: Positive for confusion.     Physical Exam Updated Vital Signs BP 103/64   Pulse 99   Temp (!) 94.6 F (34.8 C) (Rectal)   Resp 14   Wt 79.4 kg (175 lb)   SpO2 (!) 89%   BMI 23.41 kg/m   Physical Exam  Constitutional: He appears well-developed and well-nourished. No distress.  Hypothermic, following commands, no acute distress  HENT:  Head: Normocephalic and atraumatic.  Eyes: EOM are normal. Pupils are equal, round, and reactive to light.  Cardiovascular: Normal rate and normal heart sounds.  Pulmonary/Chest: Effort normal. No stridor. No respiratory distress. He has no wheezes. He has rales.  bibasilar crackles worse on the left  Abdominal: Soft. He exhibits no distension. There is no tenderness. There is no guarding.  Genitourinary: Penis normal. No penile tenderness.  Genitourinary Comments: Erythema to the groin bilaterally  Musculoskeletal: He exhibits edema.  Left large toe with erythema and onychomycosis. Full rom. 3+ pitting edema to lower extremities bilaterally involving his thighs  Neurological: He is alert.  5 out of 5 strength to upper and lower extremities bilaterally  Skin: Skin is warm and dry. No rash noted. He is not diaphoretic. There is erythema. There is pallor.  Psychiatric: He has a normal mood and affect.  Nursing note and vitals reviewed.    ED Treatments / Results  Labs (all labs ordered are listed, but only abnormal results are displayed) Labs Reviewed  COMPREHENSIVE METABOLIC PANEL - Abnormal; Notable for the following components:      Result Value   Sodium 131 (*)    Chloride 99 (*)    BUN 21 (*)    Total Protein 5.2 (*)    Albumin 2.8 (*)    AST 45 (*)    All other components within normal limits  CBC WITH DIFFERENTIAL/PLATELET - Abnormal; Notable for the following components:   RBC 3.10 (*)    Hemoglobin 9.9 (*)    HCT 28.0 (*)    RDW  15.6 (*)    Platelets 88 (*)    Lymphs Abs 0.2 (*)    All other components within normal limits  BRAIN NATRIURETIC PEPTIDE - Abnormal; Notable for the following components:   B Natriuretic Peptide 300.7 (*)    All other components within normal limits  URINALYSIS, ROUTINE W REFLEX MICROSCOPIC - Abnormal; Notable for the following components:   APPearance HAZY (*)    Hgb urine dipstick LARGE (*)    Squamous Epithelial / LPF 0-5 (*)    All other components within normal limits  CULTURE, BLOOD (ROUTINE X 2)  CULTURE, BLOOD (ROUTINE X 2)  INFLUENZA PANEL BY PCR (TYPE A & B)  MAGNESIUM  MAGNESIUM  PROCALCITONIN  PROCALCITONIN  CORTISOL  BASIC METABOLIC PANEL  CBC  MAGNESIUM  PHOSPHORUS  TSH  I-STAT TROPONIN, ED  I-STAT CG4 LACTIC ACID, ED  CBG MONITORING, ED  POC OCCULT BLOOD, ED    EKG  EKG Interpretation  Date/Time:  Monday March 04 2017 21:08:15 EST Ventricular Rate:  44 PR Interval:    QRS Duration: 91 QT Interval:  687 QTC Calculation: 588 R Axis:   -106 Text Interpretation:  Atrial fibrillation Low voltage, extremity and precordial leads Consider inferior infarct Nonspecific repol abnormality, diffuse leads Prolonged QT interval Baseline wander in lead(s) V6 Since last tracing rate slower Confirmed by Mancel BaleWentz, Elliott [phone]([id number]) on 02-03-2017 9:16:13 PM       Radiology Dg Chest 2 View  Result Date: 02-03-2017 CLINICAL DATA:  Edema. EXAM: CHEST  2 VIEW COMPARISON:  11/26/2015 FINDINGS: Cardiac silhouette is mildly enlarged. There is enlargement of the superior mediastinum increased when compared the prior chest radiograph. This may reflect a mass or adenopathy. It may be due to prominent vascularity or enlarged thyroid. No other evidence of mediastinal mass or adenopathy. There is interstitial thickening with hazy airspace opacity, the latter most evident in the right lung base. Small pleural effusions are noted. No pneumothorax. Skeletal structures are demineralized  but grossly intact. IMPRESSION: 1. Cardiomegaly with interstitial thickening, hazy lower lung zone airspace opacity and small pleural effusions. Findings consistent with congestive heart failure. 2. Soft tissue enlargement of the superior mediastinum. This could reflect a mass or adenopathy. Consider follow-up chest CT for further assessment on a nonemergent basis. Electronically Signed   By: Amie Portlandavid  Ormond M.D.   On: 02-03-2017 17:19    Procedures Procedures (including critical care time) CRITICAL CARE Performed by: Georgiana ShoreJessica B Moua Rasmusson   Total critical care time: 30 minutes  Critical care time was exclusive of separately billable procedures and treating other patients.  Critical care was necessary to treat or prevent imminent or life-threatening deterioration.  Critical care was time spent personally by me on the following activities: development of treatment plan with patient and/or surrogate as well as nursing, discussions with consultants, evaluation of patient's response to treatment, examination of patient, obtaining history from patient or surrogate, ordering and performing treatments and interventions, ordering and review of laboratory studies, ordering and review of radiographic studies, pulse oximetry and re-evaluation of patient's condition.  Medications Ordered in ED Medications  vancomycin (VANCOCIN) IVPB 750 mg/150 ml premix (not administered)  piperacillin-tazobactam (ZOSYN) IVPB 3.375 g (not administered)  sodium chloride 0.9 % bolus 1,000 mL (0 mLs Intravenous Stopped 2016/04/19 1943)    And  sodium chloride 0.9 % bolus 1,000 mL (1,000 mLs Intravenous Not Given 2016/04/19 1907)    And  sodium chloride 0.9 % bolus 500 mL (0 mLs Intravenous Stopped 2016/04/19 2113)  0.9 %  sodium chloride infusion (not administered)  heparin injection 5,000 Units (not administered)  phenylephrine (NEO-SYNEPHRINE) 10 mg in dextrose 5 % 250 mL (0.04 mg/mL) infusion (90 mcg/min Intravenous Rate/Dose  Change 2016/04/19 2252)  sodium chloride 0.9 % bolus 500 mL (0 mLs Intravenous Stopped 2016/04/19 1744)  0.9 %  sodium chloride infusion ( Intravenous New Bag/Given 2016/04/19 1753)  sodium chloride 0.9 % bolus 500 mL (0 mLs Intravenous Stopped 2016/04/19 1824)  vancomycin (VANCOCIN) IVPB 1000 mg/200 mL premix (0 mg Intravenous Stopped 2016/04/19 2023)  piperacillin-tazobactam (ZOSYN) IVPB 3.375 g (0 g Intravenous Stopped 2016/04/19 1943)  phenylephrine (NEO-SYNEPHRINE) 10 mg in dextrose 5 % 250 mL (0.04 mg/mL) infusion (0 mcg/min Intravenous Stopped 2016/04/19 2252)  Initial Impression / Assessment and Plan / ED Course  I have reviewed the triage vital signs and the nursing notes.  Pertinent labs & imaging results that were available during my care of the patient were reviewed by me and considered in my medical decision making (see chart for details).  Clinical Course as of Mar 04 2320  Mon Mar 04, 2017  1731 At this time the patient is suspected to have an undifferentiated infection, causing confusion, low blood pressure, and low core temperature.  Initial lactate is normal, which is reassuring.  Patient has been given IV fluid bolus of 500 cc saline. At 17:05 BP 82/54.  Initial lactate is normal.  Chest x-ray concerning for CHF. Will give second IV bolus 500 cc. Empiric ABX have been ordered.  [EW]  1849 Volume Status assessment with the assistance of Dr. Rubin Payor, using U/S. Exam limited d/t bowel gas. Possible small IVC seen, but could not assess for respiration variation in caliber. Therefore, will assume intravascular volume depletion in this patient with persistent hypotension, and give up to 30cc/kg IV saline bolus.  [EW]    Clinical Course User Index [EW] Mancel Bale, MD   Patient presenting with increased confusion, hypothermic on rectal temp 92, 3+ pitting edema in lower extremities bilaterally.  Denies any chest pain or shortness of breath. Erythema in the inguinal region and to the  left toe with evidence of onychomycosis  Obtained EKG, chest x-ray, labs and started gentle hydration.  Initial lactate negative, hypotensive  Hypoalbuminemia  hgb 9.9, rectal hemoccult negative  On reassessment, patient continues to be hypotensive. He is alert and responding to commands, asking for food.  Patient discussed with Dr. Effie Shy Started ns infusion at 125 mL/hr On further reassessments he continues to be hypotensive, initiated IV fluids at 45mL/kg. U/S with small IVC suspecting low intravascular volume.  On reassessment, patient continues to have rectal temp of 92 even after significant amount of time under the warming blanket. Blood pressure improved to 98/72.  Spoke to hospitalist Dr. Toniann Fail who requested Code status Attempted to reach the facility and sister multiple times without response to clarify patient's code status.   Nurse was able to obtain code status and Full code  Episodic bradycardia and prolonged QT on repeat EKG. Temperature started to trend up. Patient resting comfortably.  Dr. Effie Shy spoke to intensivist who accepted patient for admission.  Final Clinical Impressions(s) / ED Diagnoses   Final diagnoses:  Generalized edema  Hypothermia, initial encounter  Hypoalbuminemia  Hypotension due to hypovolemia    ED Discharge Orders    None       Gregary Cromer 02/14/2017 2344    Mancel Bale, MD 03/05/17 [phone]

## 2017-03-04 NOTE — H&P (Addendum)
PULMONARY / CRITICAL CARE MEDICINE   Name: Donald Reynolds MRN: [id number] DOB: [birth year]    ADMISSION DATE:  03/25/2017 CONSULTATION DATE:  03/25/2017  REFERRING MD:  Dr. Effie Shy   CHIEF COMPLAINT:  AMS  HISTORY OF PRESENT ILLNESS:   83 year old male with PMH of CHF, CAD, DVT, A.Fib (history of pradaxa), HTN, Dementia, and Seizure Disorder (diagnosed in 20's and treated with phenobarbital-seizure free >50 years).   Presents to ED from SNF with increased confusion and bilateral upper/lower extremity edema and facial swelling. Upon arrival to ED patient is hypothermic (Temp 91.8 Rectal), Hypotensive (Systolic 80-90), and Bradycardia (HR 40-50). LA 1.52, WBC 7.6. Patient given 2.5L NS and Vancomycin/Zosyn and ultimately Neo Gtt. After fluid became hypoxic requiring NRB. CXR with small pleural effusions and hazy lower lung zone airspace opacity consistent with heart failure. PCCM asked to admit.     Patient is very difficult to understand but was able to deny having any chest pain or shortness of breath. Told the ED staff he wanted pancakes.   PAST MEDICAL HISTORY :  He  has a past medical history of Acute renal failure (HCC), Anemia, Arthritis, Atrial fibrillation (HCC), CHF (congestive heart failure) (HCC), Coronary artery disease, Deep venous thrombosis (HCC), Dementia, Diarrhea, Enlarged prostate, Heart murmur, Hiatal hernia (2003), History of seizure disorder, Hypertension, Hypokalemia, Nausea & vomiting, Seizures (HCC), and Vitiligo.  PAST SURGICAL HISTORY: He  has a past surgical history that includes Hernia repair.  No Known Allergies  No current facility-administered medications on file prior to encounter.    Current Outpatient Medications on File Prior to Encounter  Medication Sig  . Cholecalciferol (VITAMIN D) 2000 units CAPS Take 2,000 Units daily by mouth.   . dabigatran (PRADAXA) 150 MG CAPS capsule Take 1 capsule (150 mg total) by mouth every 12 (twelve) hours.  .  fesoterodine (TOVIAZ) 4 MG TB24 tablet Take 4 mg by mouth daily.   . iron polysaccharides (NIFEREX) 150 MG capsule Take 150 mg by mouth daily.  Marland Kitchen PHENobarbital (LUMINAL) 97.2 MG tablet Take 97.2 mg by mouth daily.   . tamsulosin (FLOMAX) 0.4 MG CAPS capsule Take 0.4 mg by mouth daily.   . vitamin B-12 (CYANOCOBALAMIN) 500 MCG tablet Take 500 mcg by mouth daily.     FAMILY HISTORY:  His indicated that his mother is deceased. He indicated that his father is deceased. He indicated that his brother is deceased. He indicated that his maternal grandmother is deceased. He indicated that his maternal grandfather is deceased. He indicated that his paternal grandmother is deceased. He indicated that his paternal grandfather is deceased. He indicated that the status of his neg hx is unknown. He indicated that the status of his other is unknown.   SOCIAL HISTORY: He  reports that he has quit smoking. he has never used smokeless tobacco. He reports that he does not drink alcohol or use drugs.  REVIEW OF SYSTEMS:   Unable to review as patient is confused   SUBJECTIVE:   VITAL SIGNS: BP 114/73   Pulse (!) 103   Temp (!) 94.6 F (34.8 C) (Rectal)   Resp 20   Wt 79.4 kg (175 lb)   SpO2 97%   BMI 23.41 kg/m   HEMODYNAMICS:    VENTILATOR SETTINGS: FiO2 (%):  [35 %-100 %] 100 %  INTAKE / OUTPUT: I/O last 3 completed shifts: In: 1000 [IV Piggyback:1000] Out: -   PHYSICAL EXAMINATION: General:  Elderly male, no distress words difficult to understand appropriate  for age  Neuro:  Confused, follows commands, alert Opens eyes to verbal stimuli  HEENT:  Dry MM  Cardiovascular:  Huston FoleyBrady, A.Fib  Lungs:  Crackles to bases, diminished, non-labored Abdomen:  Non-tender, active bowel sounds  Musculoskeletal:  +3 Edema to BLE  Skin:  Warm, Dry   LABS:  BMET Recent Labs  Lab 03/09/2017 1551  NA 131*  K 4.1  CL 99*  CO2 26  BUN 21*  CREATININE 0.74  GLUCOSE 66    Electrolytes Recent Labs   Lab 02/16/2017 1551  CALCIUM 9.0  MG 1.7    CBC Recent Labs  Lab 03/03/2017 1551  WBC 7.6  HGB 9.9*  HCT 28.0*  PLT 88*    Coag's No results for input(s): APTT, INR in the last 168 hours.  Sepsis Markers Recent Labs  Lab 03/12/2017 1613  LATICACIDVEN 1.52    ABG No results for input(s): PHART, PCO2ART, PO2ART in the last 168 hours.  Liver Enzymes Recent Labs  Lab 02/14/2017 1551  AST 45*  ALT 40  ALKPHOS 110  BILITOT 0.6  ALBUMIN 2.8*    Cardiac Enzymes No results for input(s): TROPONINI, PROBNP in the last 168 hours.  Glucose Recent Labs  Lab 03/08/2017 1629  GLUCAP 84    Imaging Dg Chest 2 View  Result Date: 03/15/2017 CLINICAL DATA:  Edema. EXAM: CHEST  2 VIEW COMPARISON:  11/26/2015 FINDINGS: Cardiac silhouette is mildly enlarged. There is enlargement of the superior mediastinum increased when compared the prior chest radiograph. This may reflect a mass or adenopathy. It may be due to prominent vascularity or enlarged thyroid. No other evidence of mediastinal mass or adenopathy. There is interstitial thickening with hazy airspace opacity, the latter most evident in the right lung base. Small pleural effusions are noted. No pneumothorax. Skeletal structures are demineralized but grossly intact. IMPRESSION: 1. Cardiomegaly with interstitial thickening, hazy lower lung zone airspace opacity and small pleural effusions. Findings consistent with congestive heart failure. 2. Soft tissue enlargement of the superior mediastinum. This could reflect a mass or adenopathy. Consider follow-up chest CT for further assessment on a nonemergent basis. Electronically Signed   By: Amie Portlandavid  Ormond M.D.   On: 03/03/2017 17:19     STUDIES:  CXR 11/19 > Cardiomegaly with interstitial thickening, hazy lower lung zone airspace opacity and small pleural effusions. Findings consistent with congestive heart failure. Soft tissue enlargement of the superior mediastinum. This could reflect a  mass or adenopathy.  ECHO 11/19 >> LE Dopplers 11/19 >>   CULTURES: U/A 11/19 > Negative  Flu 11/19 > Negative  Blood 11/19 >> RVP 11/19 >>  ANTIBIOTICS: Vancomycin 11/19 >> Zosyn 11/19 >>   SIGNIFICANT EVENTS: 11/19 > Presents to ED   LINES/TUBES: PIV  DISCUSSION: 83 year old male presents 11/19 from SNF with increased swelling of extremities. Found to be hypotensive, bradycardiac, and hypothermic. Given Vancomycin and Zosyn in ED with 2.5 L NS and Neo gtt. After Fluid became hypoxic requiring NRB.   ASSESSMENT / PLAN:  PULMONARY A: Acute Hypoxic Respiratory Failure multifactorial:  acute on chronic heart failure and r/o hospital associated pneumonia  P:   Maintain Oxygen Saturation >92  Pulmonary Hygiene  Patient is not in respiratory distress but saturating <85% on NRB. Started on BIPAP in the ED. Due to advanced age and possible poor outcome will be aggressive with conservative measures. BIPAP setting of 12/8 100%. Patient is tolerating it will and appears very comfortable. Goal SaO2>88%. Repeat ABG in 30 mins. If unable  to maintain saturation then will consider intubation.   Patient is full code.   CARDIOVASCULAR A:  Acute on Chronic HF (No ECHO in EMR) Bradycardia, Hypotension in setting of septic (most likely)  vs cardiogenic shock  H/O HTN, CAD, HLD, A.Fib    -BNP 300.7 P:  Cardiac Monitoring  Started on neo in the ED. Switch to levophed.  MAP >65 or Systolic >90 (Through PIV for dose less than 200 mcg/min)  Coox pending  ECHO pending  Patient has history of being on Pradaxa from cardiology note 4/17. Patient current medication list does not include this. Unsure why. Will hold for now and try to obtain better history from nursing home.   RENAL A:   Hypomagnesemia  S/P 2.5L in ED  P:   Trend BMP  Replace electrolytes as indicated  KVO Fluids Continue Flomax  Monitor I/O  GASTROINTESTINAL A:   No issues  P:   NPO  HEMATOLOGIC A:   Normocytic  Anemia  Thrombocytopenia  H/O DVT  P:  Trend CBC  Hg stable. No signs of bleeding at this time  U/S LU Pending   INFECTIOUS A:   Hypothermic  -U/A Negative  -Flu negative  P:   Trend WBC and Fever Curve  Trend PCT  Follow Culture Data  RVP Pending  Continue Antibiotics as above   ENDOCRINE A:   Hypoglycemia    H/O Elevated TSH  P:   Trend Glucose  TSH Pending > Given H/O of Elevated TSH and current bradycardia/hypotheremia/edema need to rule out hypothyroidism/ myxedema  Cortisol Pending   NEUROLOGIC A:   Dementia  H/O Multiple Falls, Seizures   P:   Monitor. No focal deficits seen at this time  Hold sedating Medications  Phenobarbital level pending Continue Phenobarbital 97.2 mg Daily   FAMILY  - Updates: No family at bedside, unable to reach sister who is listed on Facesheet. SNF verifies patient is a Full Code.   - Inter-disciplinary family meet or Palliative Care meeting due by: 03/11/2017    CC Time: 45 minutes  Jovita KussmaulKatalina Eubanks, AGACNP-BC [facility]  Pgr: [phone]  PCCM Pgr: [phone]  Lionel Decemberijo Niyati Heinke D.O [facility] Pager: [phone]

## 2017-03-04 NOTE — ED Notes (Signed)
Intensivist at bedside.

## 2017-03-04 NOTE — ED Notes (Signed)
Patient placed on zoll and code cart outside of room.  EDP has been made aware of HR changes and hypotension.

## 2017-03-04 NOTE — ED Notes (Signed)
Pts oxygen dropping to 89% on 5L. EDP made aware. Advised to place patient on venti mask.

## 2017-03-04 NOTE — ED Triage Notes (Signed)
Per EMS, [facility] [place] with complaint of leg, arm, and face swelling.  Hx of CHF and Afib. Not on diuretics.  SNF states he is more confused than normal.  104/68, 70-100 HR AFIB, 94% RA, CBG 112, RR 16, 18G RFA.

## 2017-03-04 NOTE — ED Notes (Signed)
Paged intensivist.  Patient is still staying around 84% on venturi mask at 40% FIO2.

## 2017-03-05 ENCOUNTER — Inpatient Hospital Stay (HOSPITAL_COMMUNITY): Payer: Medicare HMO

## 2017-03-05 DIAGNOSIS — A419 Sepsis, unspecified organism: Secondary | ICD-10-CM

## 2017-03-05 DIAGNOSIS — R6521 Severe sepsis with septic shock: Secondary | ICD-10-CM

## 2017-03-05 DIAGNOSIS — I340 Nonrheumatic mitral (valve) insufficiency: Secondary | ICD-10-CM

## 2017-03-05 LAB — GLUCOSE, CAPILLARY
Glucose-Capillary: 96 mg/dL (ref 65–99)
Glucose-Capillary: 94 mg/dL (ref 65–99)
Glucose-Capillary: 117 mg/dL — ABNORMAL HIGH (ref 65–99)
Glucose-Capillary: 154 mg/dL — ABNORMAL HIGH (ref 65–99)
Glucose-Capillary: 93 mg/dL (ref 65–99)

## 2017-03-05 LAB — RESPIRATORY PANEL BY PCR

## 2017-03-05 LAB — BASIC METABOLIC PANEL
Anion gap: 7 (ref 5–15)
BUN: 21 mg/dL — AB (ref 6–20)
CO2: 22 mmol/L (ref 22–32)
Calcium: 8.7 mg/dL — ABNORMAL LOW (ref 8.9–10.3)
Chloride: 102 mmol/L (ref 101–111)
Creatinine, Ser: 0.75 mg/dL (ref 0.61–1.24)
Glucose, Bld: 76 mg/dL (ref 65–99)
POTASSIUM: 3.9 mmol/L (ref 3.5–5.1)
SODIUM: 131 mmol/L — AB (ref 135–145)

## 2017-03-05 LAB — CBC
HEMATOCRIT: 28.5 % — AB (ref 39.0–52.0)
Hemoglobin: 10 g/dL — ABNORMAL LOW (ref 13.0–17.0)
MCH: 31.5 pg (ref 26.0–34.0)
MCHC: 35.1 g/dL (ref 30.0–36.0)
MCV: 89.9 fL (ref 78.0–100.0)
PLATELETS: 90 10*3/uL — AB (ref 150–400)
RBC: 3.17 MIL/uL — AB (ref 4.22–5.81)
RDW: 15.5 % (ref 11.5–15.5)
WBC: 2.5 10*3/uL — AB (ref 4.0–10.5)

## 2017-03-05 LAB — I-STAT ARTERIAL BLOOD GAS, ED
Bicarbonate: 24.9 mmol/L (ref 20.0–28.0)
O2 SAT: 91 %
TCO2: 26 mmol/L (ref 22–32)
pCO2 arterial: 38.3 mmHg (ref 32.0–48.0)
pH, Arterial: 7.421 (ref 7.350–7.450)
pO2, Arterial: 60 mmHg — ABNORMAL LOW (ref 83.0–108.0)

## 2017-03-05 LAB — BLOOD GAS, ARTERIAL
Acid-Base Excess: 0.1 mmol/L (ref 0.0–2.0)
Bicarbonate: 23.9 mmol/L (ref 20.0–28.0)
Delivery systems: POSITIVE
Drawn by: 42624
Expiratory PAP: 8
FIO2: 1
Inspiratory PAP: 12
O2 Saturation: 83.5 %
Patient temperature: 98.6
RATE: 8 resp/min
pCO2 arterial: 36.7 mmHg (ref 32.0–48.0)
pH, Arterial: 7.43 (ref 7.350–7.450)
pO2, Arterial: 49.6 mmHg — ABNORMAL LOW (ref 83.0–108.0)

## 2017-03-05 LAB — POCT I-STAT 3, ART BLOOD GAS (G3+)
ACID-BASE DEFICIT: 2 mmol/L (ref 0.0–2.0)
BICARBONATE: 23.1 mmol/L (ref 20.0–28.0)
O2 Saturation: 85 %
PO2 ART: 50 mmHg — AB (ref 83.0–108.0)
TCO2: 24 mmol/L (ref 22–32)
pCO2 arterial: 38 mmHg (ref 32.0–48.0)
pH, Arterial: 7.391 (ref 7.350–7.450)

## 2017-03-05 LAB — MRSA PCR SCREENING: MRSA by PCR: NEGATIVE

## 2017-03-05 LAB — MAGNESIUM: Magnesium: 1.8 mg/dL (ref 1.7–2.4)

## 2017-03-05 LAB — COOXEMETRY PANEL
Carboxyhemoglobin: 1 % (ref 0.5–1.5)
METHEMOGLOBIN: 1.2 % (ref 0.0–1.5)
O2 SAT: 88 %
TOTAL HEMOGLOBIN: 7.2 g/dL — AB (ref 12.0–16.0)

## 2017-03-05 LAB — DIC (DISSEMINATED INTRAVASCULAR COAGULATION) PANEL
APTT: 104 s — AB (ref 24–36)
D DIMER QUANT: 1.02 ug{FEU}/mL — AB (ref 0.00–0.50)
PLATELETS: 109 10*3/uL — AB (ref 150–400)
PROTHROMBIN TIME: 20 s — AB (ref 11.4–15.2)

## 2017-03-05 LAB — STREP PNEUMONIAE URINARY ANTIGEN: Strep Pneumo Urinary Antigen: NEGATIVE

## 2017-03-05 LAB — DIC (DISSEMINATED INTRAVASCULAR COAGULATION)PANEL
Fibrinogen: 452 mg/dL (ref 210–475)
INR: 1.72
Smear Review: NONE SEEN

## 2017-03-05 LAB — ECHOCARDIOGRAM COMPLETE
Height: 72 in
WEIGHTICAEL: 3012.37 [oz_av]

## 2017-03-05 LAB — LACTIC ACID, PLASMA: Lactic Acid, Venous: 1.1 mmol/L (ref 0.5–1.9)

## 2017-03-05 LAB — TSH: TSH: 2.872 u[IU]/mL (ref 0.350–4.500)

## 2017-03-05 LAB — PHENOBARBITAL LEVEL: PHENOBARBITAL: 15.6 ug/mL (ref 15.0–30.0)

## 2017-03-05 LAB — CORTISOL: CORTISOL PLASMA: 24.1 ug/dL

## 2017-03-05 LAB — BRAIN NATRIURETIC PEPTIDE: B Natriuretic Peptide: 309.1 pg/mL — ABNORMAL HIGH (ref 0.0–100.0)

## 2017-03-05 LAB — PROCALCITONIN: PROCALCITONIN: 0.24 ng/mL

## 2017-03-05 LAB — VANCOMYCIN, RANDOM: VANCOMYCIN RM: 11

## 2017-03-05 LAB — PHOSPHORUS: PHOSPHORUS: 3 mg/dL (ref 2.5–4.6)

## 2017-03-05 MED ORDER — MIDAZOLAM HCL 2 MG/2ML IJ SOLN
1.0000 mg | INTRAMUSCULAR | Status: DC | PRN
  Administered 2017-03-05: 1 mg via INTRAVENOUS

## 2017-03-05 MED ORDER — NOREPINEPHRINE BITARTRATE 1 MG/ML IV SOLN
0.0000 ug/min | INTRAVENOUS | Status: DC
  Administered 2017-03-05: 2 ug/min via INTRAVENOUS
  Administered 2017-03-05: 8 ug/min via INTRAVENOUS
  Administered 2017-03-05: 18 ug/min via INTRAVENOUS
  Filled 2017-03-05 (×5): qty 4

## 2017-03-05 MED ORDER — ETOMIDATE 2 MG/ML IV SOLN
20.0000 mg | Freq: Once | INTRAVENOUS | Status: AC
  Administered 2017-03-05: 20 mg via INTRAVENOUS

## 2017-03-05 MED ORDER — MIDAZOLAM HCL 2 MG/2ML IJ SOLN
1.0000 mg | INTRAMUSCULAR | Status: DC | PRN
  Administered 2017-03-06: 2 mg via INTRAVENOUS
  Administered 2017-03-06: 1 mg via INTRAVENOUS
  Filled 2017-03-05 (×2): qty 2

## 2017-03-05 MED ORDER — VANCOMYCIN HCL IN DEXTROSE 750-5 MG/150ML-% IV SOLN
750.0000 mg | Freq: Two times a day (BID) | INTRAVENOUS | Status: DC
  Administered 2017-03-05 – 2017-03-08 (×6): 750 mg via INTRAVENOUS
  Filled 2017-03-05 (×6): qty 150

## 2017-03-05 MED ORDER — SUCCINYLCHOLINE CHLORIDE 20 MG/ML IJ SOLN
100.0000 mg | Freq: Once | INTRAMUSCULAR | Status: AC
  Administered 2017-03-05: 100 mg via INTRAVENOUS

## 2017-03-05 MED ORDER — DEXTROSE 5 % IV SOLN: 1.0000 g | Freq: Two times a day (BID) | INTRAVENOUS | Status: DC

## 2017-03-05 MED ORDER — ORAL CARE MOUTH RINSE
15.0000 mL | Freq: Four times a day (QID) | OROMUCOSAL | Status: DC
  Administered 2017-03-05 – 2017-03-11 (×26): 15 mL via OROMUCOSAL

## 2017-03-05 MED ORDER — HEPARIN SODIUM (PORCINE) 5000 UNIT/ML IJ SOLN
5000.0000 [IU] | Freq: Three times a day (TID) | INTRAMUSCULAR | Status: DC
  Administered 2017-03-05 – 2017-03-12 (×22): 5000 [IU] via SUBCUTANEOUS
  Filled 2017-03-05 (×26): qty 1

## 2017-03-05 MED ORDER — DEXTROSE 5 % IV SOLN
500.0000 mg | INTRAVENOUS | Status: DC
  Administered 2017-03-05 – 2017-03-06 (×2): 500 mg via INTRAVENOUS
  Filled 2017-03-05 (×2): qty 500

## 2017-03-05 MED ORDER — FENTANYL CITRATE (PF) 100 MCG/2ML IJ SOLN
50.0000 ug | INTRAMUSCULAR | Status: DC | PRN
  Administered 2017-03-05: 50 ug via INTRAVENOUS
  Filled 2017-03-05 (×2): qty 2

## 2017-03-05 MED ORDER — SODIUM CHLORIDE 0.9 % IV SOLN
0.0000 ug/min | INTRAVENOUS | Status: DC
  Administered 2017-03-05: 130 ug/min via INTRAVENOUS
  Filled 2017-03-05 (×5): qty 1

## 2017-03-05 MED ORDER — MIDAZOLAM HCL 2 MG/2ML IJ SOLN: 1.0000 mg | INTRAMUSCULAR | Status: DC | PRN

## 2017-03-05 MED ORDER — PANTOPRAZOLE SODIUM 40 MG IV SOLR
40.0000 mg | INTRAVENOUS | Status: DC
  Administered 2017-03-05 – 2017-03-06 (×2): 40 mg via INTRAVENOUS
  Filled 2017-03-05 (×2): qty 40

## 2017-03-05 MED ORDER — MIDAZOLAM HCL 2 MG/2ML IJ SOLN
INTRAMUSCULAR | Status: AC
  Filled 2017-03-05: qty 2

## 2017-03-05 MED ORDER — PHENOBARBITAL 60 MG/ML ORAL SUSPENSION: 97.2000 mg | Freq: Every day | ORAL | Status: DC

## 2017-03-05 MED ORDER — FENTANYL CITRATE (PF) 100 MCG/2ML IJ SOLN
25.0000 ug | INTRAMUSCULAR | Status: DC | PRN
  Administered 2017-03-05 – 2017-03-07 (×9): 50 ug via INTRAVENOUS
  Administered 2017-03-08: 25 ug via INTRAVENOUS
  Filled 2017-03-05 (×10): qty 2

## 2017-03-05 MED ORDER — FENTANYL CITRATE (PF) 100 MCG/2ML IJ SOLN
50.0000 ug | INTRAMUSCULAR | Status: DC | PRN
  Administered 2017-03-05 (×2): 50 ug via INTRAVENOUS
  Filled 2017-03-05: qty 2

## 2017-03-05 MED ORDER — NOREPINEPHRINE BITARTRATE 1 MG/ML IV SOLN
0.0000 ug/min | INTRAVENOUS | Status: DC
  Administered 2017-03-05: 18 ug/min via INTRAVENOUS
  Administered 2017-03-07: 12 ug/min via INTRAVENOUS
  Filled 2017-03-05 (×2): qty 16

## 2017-03-05 MED ORDER — FENTANYL CITRATE (PF) 100 MCG/2ML IJ SOLN
50.0000 ug | Freq: Once | INTRAMUSCULAR | Status: AC
  Administered 2017-03-05: 50 ug via INTRAVENOUS

## 2017-03-05 MED ORDER — PHENOBARBITAL 32.4 MG PO TABS
97.2000 mg | ORAL_TABLET | Freq: Every day | ORAL | Status: DC
  Administered 2017-03-05 – 2017-03-10 (×6): 97.2 mg via ORAL
  Filled 2017-03-05 (×6): qty 3

## 2017-03-05 MED ORDER — CHLORHEXIDINE GLUCONATE 0.12% ORAL RINSE (MEDLINE KIT)
15.0000 mL | Freq: Two times a day (BID) | OROMUCOSAL | Status: DC
  Administered 2017-03-05 – 2017-03-11 (×13): 15 mL via OROMUCOSAL

## 2017-03-05 MED ORDER — DEXTROSE 5 % IV SOLN
1.0000 g | Freq: Three times a day (TID) | INTRAVENOUS | Status: DC
  Administered 2017-03-05: 1 g via INTRAVENOUS
  Filled 2017-03-05 (×2): qty 1

## 2017-03-05 MED ORDER — MIDAZOLAM HCL 2 MG/2ML IJ SOLN
1.0000 mg | INTRAMUSCULAR | Status: DC | PRN
  Administered 2017-03-05 (×2): 1 mg via INTRAVENOUS
  Filled 2017-03-05 (×2): qty 2

## 2017-03-05 MED ORDER — FENTANYL CITRATE (PF) 100 MCG/2ML IJ SOLN
INTRAMUSCULAR | Status: AC
  Administered 2017-03-05: 50 ug via INTRAVENOUS
  Filled 2017-03-05: qty 2

## 2017-03-05 NOTE — Progress Notes (Signed)
Pt arrived to 2M03 with order for RVP.  Patient placed on droplet precautions and educated.

## 2017-03-05 NOTE — Progress Notes (Signed)
eLink Physician-Brief Progress Note Patient Name: Donald BerlinWilliam E Reynolds DOB: Oct 24, 1925 MRN: [id number]   Date of Service  03/05/2017  HPI/Events of Note  Hypoxemia - Sat = 83%.   eICU Interventions  Will order: 1. Trial of BiPAP.  2. Ground team notified of clinical decompensation. May require intubation if he fails or doesn't tolerate BiPAP.      Intervention Category Major Interventions: Hypoxemia - evaluation and management  Sommer,Steven Eugene 03/05/2017, 12:26 AM

## 2017-03-05 NOTE — Plan of Care (Signed)
  Interdisciplinary Goals of Care Family Meeting   Date carried out:: 03/05/2017  Location of the meeting: Bedside  Member's involved: Physician, Family Member or next of kin and Other: Pharmacy  Durable Power of Attorney or acting medical decision maker: Nephew    Discussion: We discussed goals of care for [name] .  We reviewed his current state of septic shock. Nephew informs me that his previous caretaker was his mother and father followed by his younger sister. Patient does not have dementia but rather an intellectual handicap. Ideal setting for this patient would be to return to his previous state of health valuing quality of life rather than quantity. We will reassess his clinical progress on Friday. We discussed resuscitation and have transitioned him over to a limited code while continuing vasopressor infusions for blood pressure support and endotracheal intubation.  Code status: Limited Code or DNR with short term  Disposition: Continue current acute care  Time spent for the meeting: 6 minutes   Donald CousinsJennings Reynolds 03/05/2017, 4:24 PM

## 2017-03-05 NOTE — Progress Notes (Signed)
Spoke to patient nephew and HCPOA. States that he wants to continue full aggressive care, however, if patient's heart were to stop would not want to undergo chest compressions. Code Status updated to Partial Code and nursing staff notified.   Jovita KussmaulKatalina Eubanks, AGACNP-BC [facility]  Pgr: [phone]  PCCM Pgr: [phone]

## 2017-03-05 NOTE — Progress Notes (Signed)
Pharmacy Antibiotic Note  Donald Reynolds is a 83 y.o. male admitted on 03/13/2017 with pneumonia.  Pharmacy has been consulted for change of Zosyn to Ceftazidime dosing. Patient is currently on Vancomycin per pharmacy dosing and Azithromycin per MD.   SCr is stable at 0.75 but may be falsely elevated. Unable to quantify UOP at this time- bladder scan this AM with ~250 mL but this is since admission. No other urine occurrences except for on admission when urine was documented cloudy, orange, and maladorous. Edematous. Urology consulted.  Low temperatures. Hypotensive on Levophed.  VT 11 after 2 doses vancomycin - will continue same dose but monitor closely as may accumulate  Plan: D/C Zosyn  Ceftazidime to 1g IV every 12 hours for now.  Continue Vancomycin 750mg  IV q12hr for now.  Continue Azithromycin 500 mg IV every 24 hours.  Monitor QTc on Azithromycin - F/up repeat 12L EKG in AM Monitor renal function, clinical status, and culture results.  Vancomycin trough at Css as appropriate  Height: 6' (182.9 cm) Weight: 188 lb 4.4 oz (85.4 kg) IBW/kg (Calculated) : 77.6  Temp (24hrs), Avg:97.1 F (36.2 C), Min:94.6 F (34.8 C), Max:97.7 F (36.5 C)  Recent Labs  Lab 03/15/2017 1551 03/10/2017 1613 03/05/17 0317 03/05/17 1825  WBC 7.6  --  2.5*  --   CREATININE 0.74  --  0.75  --   LATICACIDVEN  --  1.52 1.1  --   VANCORANDOM  --   --   --  11    Estimated Creatinine Clearance: 68.7 mL/min (by C-G formula based on SCr of 0.75 mg/dL).    No Known Allergies  Antimicrobials this admission: 11/19 Vanc >> 11/19 Zosyn >>11/20 11/20 Fortaz >> 11/20 Azithromycin >>  Dose adjustments this admission:  Microbiology results: 11/19 MRSA pcr negative, Flu negative 11/19 Blood >> 11/19 RVP >> 11/20 Legionella >> 11/20 Sputum >>  Donald SauersLisa Raeford Reynolds Pharm.D. CPP, BCPS Clinical Pharmacist [phone] 03/05/2017 8:45 PM

## 2017-03-05 NOTE — Progress Notes (Signed)
PULMONARY / CRITICAL CARE MEDICINE   Name: Donald Reynolds MRN: [id number] DOB: 07/30/1925    ADMISSION DATE:  03/02/17 CONSULTATION DATE:  03/02/17  REFERRING MD:  Dr. Effie ShyWentz   CHIEF COMPLAINT:  AMS  HISTORY OF PRESENT ILLNESS:   Patient is an 83 year old male with PMH of CHF, CAD, DVT, A.Fib (history of pradaxa), HTN, Dementia, and Seizure Disorder (diagnosed in 20's and treated with phenobarbital-seizure free >50 years). Presents to ED from SNF with increased confusion and bilateral upper/lower extremity edema and facial swelling. Upon arrival to ED patient is hypothermic (Temp 91.8 Rectal), Hypotensive (Systolic 80-90), and Bradycardia (HR 40-50). LA 1.52, WBC 7.6. Patient given 2.5L NS and Vancomycin/Zosyn and ultimately Neo Gtt. After fluid became hypoxic requiring NRB. CXR with small pleural effusions and hazy lower lung zone airspace opacity consistent with heart failure. PCCM asked to admit. Patient is very difficult to understand but was able to deny having any chest pain or shortness of breath. Told the ED staff he wanted pancakes.  PAST MEDICAL HISTORY :  He  has a past medical history of Acute renal failure (HCC), Anemia, Arthritis, Atrial fibrillation (HCC), CHF (congestive heart failure) (HCC), Coronary artery disease, Deep venous thrombosis (HCC), Dementia, Diarrhea, Enlarged prostate, Heart murmur, Hiatal hernia (2003), History of seizure disorder, Hypertension, Hypokalemia, Nausea & vomiting, Seizures (HCC), and Vitiligo.  PAST SURGICAL HISTORY: He  has a past surgical history that includes Hernia repair.  No Known Allergies  No current facility-administered medications on file prior to encounter.    Current Outpatient Medications on File Prior to Encounter  Medication Sig  . Cholecalciferol (VITAMIN D) 2000 units CAPS Take 2,000 Units daily by mouth.   . dabigatran (PRADAXA) 150 MG CAPS capsule Take 1 capsule (150 mg total) by mouth every 12 (twelve) hours.  .  fesoterodine (TOVIAZ) 4 MG TB24 tablet Take 4 mg by mouth daily.   . iron polysaccharides (NIFEREX) 150 MG capsule Take 150 mg by mouth daily.  Marland Kitchen. PHENobarbital (LUMINAL) 97.2 MG tablet Take 97.2 mg by mouth daily.   . tamsulosin (FLOMAX) 0.4 MG CAPS capsule Take 0.4 mg by mouth daily.   . vitamin B-12 (CYANOCOBALAMIN) 500 MCG tablet Take 500 mcg by mouth daily.     FAMILY HISTORY:  His indicated that his mother is deceased. He indicated that his father is deceased. He indicated that his brother is deceased. He indicated that his maternal grandmother is deceased. He indicated that his maternal grandfather is deceased. He indicated that his paternal grandmother is deceased. He indicated that his paternal grandfather is deceased. He indicated that the status of his neg hx is unknown. He indicated that the status of his other is unknown.   SOCIAL HISTORY: He  reports that he has quit smoking. he has never used smokeless tobacco. He reports that he does not drink alcohol or use drugs.  REVIEW OF SYSTEMS:   Unable to obtain given sedation with intubation.  SUBJECTIVE:  Hypothermic overnight though improved with BP 90s/70s on pressors. Tolerating ETT with sedation.  VITAL SIGNS: BP 99/67   Pulse (!) 54   Temp 97.7 F (36.5 C) (Oral)   Resp (!) 22   Ht 6' (1.829 m)   Wt 188 lb 4.4 oz (85.4 kg)   SpO2 100%   BMI 25.53 kg/m   HEMODYNAMICS:    VENTILATOR SETTINGS: Vent Mode: PRVC FiO2 (%):  [35 %-100 %] 100 % Set Rate:  [12 bmp] 12 bmp Vt Set:  [470 mL] 470  mL PEEP:  [10 cmH20-12 cmH20] 12 cmH20 Plateau Pressure:  [14 cmH20-21 cmH20] 14 cmH20  INTAKE / OUTPUT: I/O last 3 completed shifts: In: 2819.8 [I.V.:19.8; IV Piggyback:2800] Out: -   PHYSICAL EXAMINATION: General: elderly male, NAD HEENT: normocephalic, atraumatic, moist mucous membranes Neck: supple, non-tender without lymphadenopathy Cardiovascular: regular rate and rhythm without murmurs, rubs, or gallops Lungs:  crackles bilaterally with normal work of breathing Abdomen: soft, non-tender, non-distended, normoactive bowel sounds Skin: warm, dry, no rashes or lesions, cap refill < 2 seconds Extremities: warm and well perfused, normal tone, 1+ bilateral lower extremity edema Neuro: sedated on vent, does not move extremities  LABS:  BMET Recent Labs  Lab 03/07/2017 1551 03/05/17 0317  NA 131* 131*  K 4.1 3.9  CL 99* 102  CO2 26 22  BUN 21* 21*  CREATININE 0.74 0.75  GLUCOSE 66 76    Electrolytes Recent Labs  Lab 02/16/2017 1551 03/05/17 0317  CALCIUM 9.0 8.7*  MG 1.7 1.8  PHOS  --  3.0    CBC Recent Labs  Lab 03/15/2017 1551 03/05/17 0317  WBC 7.6 2.5*  HGB 9.9* 10.0*  HCT 28.0* 28.5*  PLT 88* 90*    Coag's No results for input(s): APTT, INR in the last 168 hours.  Sepsis Markers Recent Labs  Lab 02/17/2017 1551 03/13/2017 1613 03/05/17 0317  LATICACIDVEN  --  1.52 1.1  PROCALCITON 0.65  --  0.24    ABG Recent Labs  Lab 03/05/17 0011 03/05/17 0200 03/05/17 0529  PHART 7.421 7.430 7.391  PCO2ART 38.3 36.7 38.0  PO2ART 60.0* 49.6* 50.0*    Liver Enzymes Recent Labs  Lab 02/27/2017 1551  AST 45*  ALT 40  ALKPHOS 110  BILITOT 0.6  ALBUMIN 2.8*    Cardiac Enzymes No results for input(s): TROPONINI, PROBNP in the last 168 hours.  Glucose Recent Labs  Lab 02/21/2017 1629 03/05/17 0144 03/05/17 0755  GLUCAP 84 96 94    Imaging Dg Chest 2 View  Result Date: 03/13/2017 CLINICAL DATA:  Edema. EXAM: CHEST  2 VIEW COMPARISON:  11/26/2015 FINDINGS: Cardiac silhouette is mildly enlarged. There is enlargement of the superior mediastinum increased when compared the prior chest radiograph. This may reflect a mass or adenopathy. It may be due to prominent vascularity or enlarged thyroid. No other evidence of mediastinal mass or adenopathy. There is interstitial thickening with hazy airspace opacity, the latter most evident in the right lung base. Small pleural  effusions are noted. No pneumothorax. Skeletal structures are demineralized but grossly intact. IMPRESSION: 1. Cardiomegaly with interstitial thickening, hazy lower lung zone airspace opacity and small pleural effusions. Findings consistent with congestive heart failure. 2. Soft tissue enlargement of the superior mediastinum. This could reflect a mass or adenopathy. Consider follow-up chest CT for further assessment on a nonemergent basis. Electronically Signed   By: Amie Portland M.D.   On: 02/14/2017 17:19   Dg Chest Port 1 View  Result Date: 03/05/2017 CLINICAL DATA:  Sepsis, status post central line placement. EXAM: PORTABLE CHEST 1 VIEW COMPARISON:  Portable chest x-ray of earlier today FINDINGS: A left internal jugular venous catheter has been placed whose tip projects over the junction of the right and left brachiocephalic veins. The lungs are well-expanded. Confluent alveolar opacities are present in the right mid and lower lung. There is likely pleural fluid layering posteriorly on the right. The retrocardiac region on the left remains dense. The cardiac silhouette is enlarged. The pulmonary vascularity is not clearly engorged. There is  calcification in the wall of the aortic arch. External pacemaker defibrillator pads are present. The endotracheal tube tip projects 4.2 cm above the carina. The esophagogastric tube tip projects below the GE junction but the proximal port may lie above the GE junction. IMPRESSION: Confluent alveolar opacity in the right mid and lower lung worrisome for pneumonia though asymmetric pulmonary edema could produce a similar pattern. Probable moderate size right pleural effusion layering posteriorly. Probable left lower lobe atelectasis. Cardiomegaly without significant pulmonary vascular congestion. Thoracic aortic atherosclerosis. No postprocedure complication following left internal jugular venous catheter placement. Advancement of the esophagogastric tube by 10 cm would  assure that the proximal port is positioned below the GE junction. Electronically Signed   By: Nina Hoar  [place] M.D.   On: 03/05/2017 07:17   Dg Chest Port 1 View  Result Date: 03/05/2017 CLINICAL DATA:  Endotracheal tube placement EXAM: PORTABLE CHEST 1 VIEW COMPARISON:  2016-12-25 FINDINGS: Endotracheal tube is been placed with tip measuring 4.8 cm above the carina. Enteric tube tip is off the field of view but below the left hemidiaphragm. Shallow inspiration. Mild cardiac enlargement. No vascular congestion. There is prominent perihilar airspace infiltration with patchy infiltrate in the left perihilar region. This could represent pneumonia or asymmetrical edema. There are probable bilateral pleural effusions, greater on the right. No pneumothorax. Calcification of the aorta. IMPRESSION: Appliances appear in satisfactory position. Cardiac enlargement. Airspace disease in the right lung, likely pneumonia. Also could consider asymmetrical edema. Progression since previous study. Probable small pleural effusions, greater on the right. Electronically Signed   By: Burman NievesWilliam  Stevens M.D.   On: 03/05/2017 04:19   Dg Abd Portable 1v  Result Date: 03/05/2017 CLINICAL DATA:  OG tube placement EXAM: PORTABLE ABDOMEN - 1 VIEW COMPARISON:  09/28/2008 FINDINGS: An enteric tube has been placed with tip in the left upper quadrant consistent with location in the upper stomach. Endotracheal tube tip is above the carina. Right perihilar and basilar infiltration likely representing pneumonia although asymmetrical edema could also have this appearance. Probable right pleural effusion. IMPRESSION: Enteric tube tip in the left upper quadrant consistent with location in the upper stomach. Right perihilar and basilar infiltration likely pneumonia. Probable pleural effusions. Electronically Signed   By: Burman NievesWilliam  Stevens M.D.   On: 03/05/2017 04:18     STUDIES:  DG Chest 2 View (11/19): Cardiomegaly with interstitial  thickening, hazy lower lung zone airspace opacities and small pleural effusions consistent with CHF, soft tissue enlargement superior mediastinum could reflect mass or adenopathy, recommend follow-up chest CT DG Abd Portable 1V (11/20): Stable NG tube and ETT, right perihilar and basilar infiltration likely representing pneumonia versus asymmetrical edema, probable right pleural effusion DG CHEST PORT 1 VIEW (11/20): Cardiac enlargement, airspace disease in right lung likely pneumonia, could consider asymmetrical edema, progression since previous study with probable small pleural effusions greater on right DG CHEST PORT 1 VIEW (11/20): Confluent alveolar opacity in the right mid and lower lung worrisome for pneumonia vs asymmetric pulmonary edema, moderate size right pleural effusion layering posteriorly, cardiomegaly without significant pulmonary vascular congestion  CULTURES: Blood culture x2 (11/19): Pending RVP PCR (11/19): Pending MRSA PCR (11/19): Negative Respiratory culture (11/20): Pending Strep pneumo urine (11/20): Pending Legionella urine (11/20): Pending  ANTIBIOTICS: Zosyn 11/19-11/20 Vancomycin 11/19 >> Ceftazidime 11/20 >> Azithromycin 11/20 >>  SIGNIFICANT EVENTS: 11/19 admit and intubate for AMS, hypothermia, hypotension 11/20 switched Zosyn to ceftazidime and azithromycin  LINES/TUBES: ETT 11/20 >> PIV R-forearm 11/19 >> PIV L-forearm 11/19 >> Ext urinary  cath 11/20 >> NG/OG 11/20 >>  DISCUSSION: Patient is a PMH of CHF, CAD, DVT, A. fib, HTN, dementia, seizure disorder presenting from SNF with AMS, hypothermia, hypotension, bradycardia and bilateral upper/lower extremity edema and facial swelling requiring intubation on 11/20.  ASSESSMENT / PLAN:  PULMONARY Acute hypoxic respiratory failure: Likely HAP though heart failure exacerbation is possible - Full vent support, wean as tolerated keeping sats >92% - Pulmonary hygiene - Treatment for HAP per  below  CARDIOVASCULAR Acute on chronic heart failure (no echo per EMR): Likely contributing to respiratory failure Bradycardia with hypotension: Likely sepsis versus cardiogenic shock (BNP 300 on admission) H/o HTN, CAD, HLD, A. fib - Telemetry - Phenylephrine, keep MAP >65 & SBP >90 - Echocardiogram pending - Holding home Pradaxa due to no mention on med list though found on cardiology note 4/17  RENAL Hypomagnesemia: Improving Hyponatremia  - Trend RFP and mag level, replace electrolytes as indicated - KVO - Continue home Flomax - Daily weights / strict I&Os  GASTROINTESTINAL Nutrition - NPO - Protonix IV for SUP  HEMATOLOGIC Normocytic anemia: Hemoglobin stable signs of active bleeding Thrombocytopenia: Uncertain etiology though infection may be contributing H/o DVT - Trend CBC, DIC panel pending - Pending LE [place] - Heparin SQ  INFECTIOUS  Sepsis shock secondary to HAP: Negative UA and influenza on admission - Pending blood culture, RVP, step pneumo, and legionella - Trend fever curve / WBC / PCT - Day 2 of vancomycin and Zosyn - Droplet precaution  ENDOCRINE Hypoglycemia: Low 80s to 90s, cortisol 24.1 on admission H/o elevated TSH: TSH WNL on admission - Trending CBGs  NEUROLOGIC Dementia H/o multiple falls, seizures - RASS goal: 0 to -1 - Continue home phenobarbital per tube - Sedation: Fentanyl PRN / midazolam PRN   FAMILY  - Updates: nephew at bedside this morning (only family member), decision to make partial DNR (no chest compressions) - Inter-disciplinary family meet or Palliative Care meeting due by: 03/11/2017   Durward Parcel, DO [place]

## 2017-03-05 NOTE — Consult Note (Signed)
H&P Physician requesting consult: Tera Partridge MD  Chief Complaint: Difficult Foley catheter placement  History of Present Illness: 83 year old male with history of CHF, CAD, A. fib, seizures, mental disability currently admitted with confusion, shock, hypothermia and is currently in the ICU on a ventilator.  Nursing attempted Foley catheter placement but this was unsuccessful.  Past medical history, past surgical history, medications, allergies, social history are reviewed in the chart but unable to be confirmed with the patient.  Past Medical History:  Diagnosis Date  . Acute renal failure ([place])   . Anemia   . Arthritis   . Atrial fibrillation ([place])   . CHF (congestive heart failure) ([place])   . Coronary artery disease   . Deep venous thrombosis ([facility])    Deep venous thrombosis prophylaxis  . Dementia   . Diarrhea    secondary to Salmonella species  . Enlarged prostate   . Heart murmur   . Hiatal hernia 2003  . History of seizure disorder   . Hypertension   . Hypokalemia   . Nausea & vomiting   . Seizures ([place])   . Vitiligo    Past Surgical History:  Procedure Laterality Date  . HERNIA REPAIR      Home Medications:  Medications Prior to Admission  Medication Sig Dispense Refill Last Dose  . Cholecalciferol (VITAMIN D) 2000 units CAPS Take 2,000 Units daily by mouth.    03/01/2017 at Unknown time  . dabigatran (PRADAXA) 150 MG CAPS capsule Take 1 capsule (150 mg total) by mouth every 12 (twelve) hours. 60 capsule 11 03/07/2017 at Unknown time  . fesoterodine (TOVIAZ) 4 MG TB24 tablet Take 4 mg by mouth daily.    02/15/2017 at Unknown time  . iron polysaccharides (NIFEREX) 150 MG capsule Take 150 mg by mouth daily.   03/03/2017 at Unknown time  . PHENobarbital (LUMINAL) 97.2 MG tablet Take 97.2 mg by mouth daily.    03/03/2017 at Unknown time  . tamsulosin (FLOMAX) 0.4 MG CAPS capsule Take 0.4 mg by mouth daily.    02/24/2017 at Unknown time  . vitamin B-12  (CYANOCOBALAMIN) 500 MCG tablet Take 500 mcg by mouth daily.    02/19/2017 at Unknown time   Allergies: No Known Allergies  Family History  Problem Relation Age of Onset  . Stroke Mother   . Hyperlipidemia Mother   . Hypertension Mother   . Heart attack Father   . Kidney Stones Father   . Heart disease Father   . Gout Brother   . Heart disease Brother   . Diabetes Other        nephew  . Colon cancer Neg Hx    Social History:  reports that he has quit smoking. he has never used smokeless tobacco. He reports that he does not drink alcohol or use drugs.  ROS: A complete review of systems was unable to be performed   Physical Exam:  Vital signs in last 24 hours: Temp:  [97.3 F (36.3 C)-97.7 F (36.5 C)] 97.3 F (36.3 C) (11/20 2001) Pulse Rate:  [33-125] 53 (11/20 2100) Resp:  [0-27] 15 (11/20 2100) BP: (63-128)/(41-90) 128/86 (11/20 2100) SpO2:  [81 %-100 %] 100 % (11/20 2100) FiO2 (%):  [35 %-100 %] 40 % (11/20 2000) Weight:  [85.4 kg (188 lb 4.4 oz)] 85.4 kg (188 lb 4.4 oz) (11/20 0133) General: Intubated and sedated HEENT: Normocephalic, atraumatic Cardiovascular: Regular rate and rhythm Lungs: On the ventilator Abdomen: Soft, nontender, nondistended, no abdominal masses Back: No  CVA tenderness GU: Uncircumcised phallus with significant penile edema. Neurologic: Grossly intact  Laboratory Data:  Results for orders placed or performed during the hospital encounter of 03/06/2017 (from the past 24 hour(s))  Respiratory Panel by PCR     Status: None   Collection Time: 03/02/2017 11:29 PM  Result Value Ref Range   Adenovirus NOT DETECTED NOT DETECTED   Coronavirus 229E NOT DETECTED NOT DETECTED   Coronavirus HKU1 NOT DETECTED NOT DETECTED   Coronavirus NL63 NOT DETECTED NOT DETECTED   Coronavirus OC43 NOT DETECTED NOT DETECTED   Metapneumovirus NOT DETECTED NOT DETECTED   Rhinovirus / Enterovirus NOT DETECTED NOT DETECTED   Influenza A NOT DETECTED NOT DETECTED    Influenza B NOT DETECTED NOT DETECTED   Parainfluenza Virus 1 NOT DETECTED NOT DETECTED   Parainfluenza Virus 2 NOT DETECTED NOT DETECTED   Parainfluenza Virus 3 NOT DETECTED NOT DETECTED   Parainfluenza Virus 4 NOT DETECTED NOT DETECTED   Respiratory Syncytial Virus NOT DETECTED NOT DETECTED   Bordetella pertussis NOT DETECTED NOT DETECTED   Chlamydophila pneumoniae NOT DETECTED NOT DETECTED   Mycoplasma pneumoniae NOT DETECTED NOT DETECTED  Cortisol     Status: None   Collection Time: 02/14/2017 11:35 PM  Result Value Ref Range   Cortisol, Plasma 24.1 ug/dL  TSH     Status: None   Collection Time: 03/05/2017 11:35 PM  Result Value Ref Range   TSH 2.872 0.350 - 4.500 uIU/mL  Phenobarbital level     Status: None   Collection Time: 03/05/2017 11:39 PM  Result Value Ref Range   Phenobarbital 15.6 15.0 - 30.0 ug/mL  .Cooxemetry Panel (carboxy, met, total hgb, O2 sat)     Status: Abnormal   Collection Time: 02/17/2017 11:59 PM  Result Value Ref Range   Total hemoglobin 7.2 (L) 12.0 - 16.0 g/dL   O2 Saturation 88.0 %   Carboxyhemoglobin 1.0 0.5 - 1.5 %   Methemoglobin 1.2 0.0 - 1.5 %  I-Stat arterial blood gas, ED     Status: Abnormal   Collection Time: 03/05/17 12:11 AM  Result Value Ref Range   pH, Arterial 7.421 7.350 - 7.450   pCO2 arterial 38.3 32.0 - 48.0 mmHg   pO2, Arterial 60.0 (L) 83.0 - 108.0 mmHg   Bicarbonate 24.9 20.0 - 28.0 mmol/L   TCO2 26 22 - 32 mmol/L   O2 Saturation 91.0 %   Patient temperature 98.6 F    Collection site RADIAL, ALLEN'S TEST ACCEPTABLE    Drawn by RT    Sample type ARTERIAL   MRSA PCR Screening     Status: None   Collection Time: 03/05/17  1:38 AM  Result Value Ref Range   MRSA by PCR NEGATIVE NEGATIVE  Glucose, capillary     Status: None   Collection Time: 03/05/17  1:44 AM  Result Value Ref Range   Glucose-Capillary 96 65 - 99 mg/dL   Comment 1 Document in Chart   Blood gas, arterial     Status: Abnormal   Collection Time: 03/05/17  2:00 AM   Result Value Ref Range   FIO2 1.00    Delivery systems BILEVEL POSITIVE AIRWAY PRESSURE    LHR 8 resp/min   Inspiratory PAP 12    Expiratory PAP 8    pH, Arterial 7.430 7.350 - 7.450   pCO2 arterial 36.7 32.0 - 48.0 mmHg   pO2, Arterial 49.6 (L) 83.0 - 108.0 mmHg   Bicarbonate 23.9 20.0 - 28.0 mmol/L  Acid-Base Excess 0.1 0.0 - 2.0 mmol/L   O2 Saturation 83.5 %   Patient temperature 98.6    Collection site RIGHT RADIAL    Drawn by [phone]    Sample type ARTERIAL DRAW    Allens test (pass/fail) PASS PASS  Procalcitonin     Status: None   Collection Time: 03/05/17  3:17 AM  Result Value Ref Range   Procalcitonin 0.24 ng/mL  Basic metabolic panel     Status: Abnormal   Collection Time: 03/05/17  3:17 AM  Result Value Ref Range   Sodium 131 (L) 135 - 145 mmol/L   Potassium 3.9 3.5 - 5.1 mmol/L   Chloride 102 101 - 111 mmol/L   CO2 22 22 - 32 mmol/L   Glucose, Bld 76 65 - 99 mg/dL   BUN 21 (H) 6 - 20 mg/dL   Creatinine, Ser 0.75 0.61 - 1.24 mg/dL   Calcium 8.7 (L) 8.9 - 10.3 mg/dL   GFR calc non Af Amer >60 >60 mL/min   GFR calc Af Amer >60 >60 mL/min   Anion gap 7 5 - 15  CBC     Status: Abnormal   Collection Time: 03/05/17  3:17 AM  Result Value Ref Range   WBC 2.5 (L) 4.0 - 10.5 K/uL   RBC 3.17 (L) 4.22 - 5.81 MIL/uL   Hemoglobin 10.0 (L) 13.0 - 17.0 g/dL   HCT 28.5 (L) 39.0 - 52.0 %   MCV 89.9 78.0 - 100.0 fL   MCH 31.5 26.0 - 34.0 pg   MCHC 35.1 30.0 - 36.0 g/dL   RDW 15.5 11.5 - 15.5 %   Platelets 90 (L) 150 - 400 K/uL  Magnesium     Status: None   Collection Time: 03/05/17  3:17 AM  Result Value Ref Range   Magnesium 1.8 1.7 - 2.4 mg/dL  Phosphorus     Status: None   Collection Time: 03/05/17  3:17 AM  Result Value Ref Range   Phosphorus 3.0 2.5 - 4.6 mg/dL  Brain natriuretic peptide     Status: Abnormal   Collection Time: 03/05/17  3:17 AM  Result Value Ref Range   B Natriuretic Peptide 309.1 (H) 0.0 - 100.0 pg/mL  Lactic acid, plasma     Status: None    Collection Time: 03/05/17  3:17 AM  Result Value Ref Range   Lactic Acid, Venous 1.1 0.5 - 1.9 mmol/L  I-STAT 3, arterial blood gas (G3+)     Status: Abnormal   Collection Time: 03/05/17  5:29 AM  Result Value Ref Range   pH, Arterial 7.391 7.350 - 7.450   pCO2 arterial 38.0 32.0 - 48.0 mmHg   pO2, Arterial 50.0 (L) 83.0 - 108.0 mmHg   Bicarbonate 23.1 20.0 - 28.0 mmol/L   TCO2 24 22 - 32 mmol/L   O2 Saturation 85.0 %   Acid-base deficit 2.0 0.0 - 2.0 mmol/L   Patient temperature 98.6 F    Collection site RADIAL, ALLEN'S TEST ACCEPTABLE    Drawn by RT    Sample type ARTERIAL   Glucose, capillary     Status: None   Collection Time: 03/05/17  7:55 AM  Result Value Ref Range   Glucose-Capillary 94 65 - 99 mg/dL   Comment 1 Capillary Specimen    Comment 2 Notify RN   DIC (disseminated intravasc coag) panel     Status: Abnormal   Collection Time: 03/05/17  9:32 AM  Result Value Ref Range   Prothrombin Time  20.0 (H) 11.4 - 15.2 seconds   INR 1.72    aPTT 104 (H) 24 - 36 seconds   Fibrinogen 452 210 - 475 mg/dL   D-Dimer, Quant 1.02 (H) 0.00 - 0.50 ug/mL-FEU   Platelets 109 (L) 150 - 400 K/uL   Smear Review NO SCHISTOCYTES SEEN   Culture, respiratory (NON-Expectorated)     Status: None (Preliminary result)   Collection Time: 03/05/17  9:58 AM  Result Value Ref Range   Specimen Description TRACHEAL ASPIRATE    Special Requests Normal    Gram Stain      ABUNDANT WBC PRESENT, PREDOMINANTLY PMN RARE SQUAMOUS EPITHELIAL CELLS PRESENT FEW GRAM POSITIVE COCCI IN CHAINS FEW YEAST WITH PSEUDOHYPHAE    Culture PENDING    Report Status PENDING   Glucose, capillary     Status: Abnormal   Collection Time: 03/05/17 12:11 PM  Result Value Ref Range   Glucose-Capillary 117 (H) 65 - 99 mg/dL   Comment 1 Capillary Specimen    Comment 2 Notify RN   Glucose, capillary     Status: Abnormal   Collection Time: 03/05/17  3:14 PM  Result Value Ref Range   Glucose-Capillary 154 (H) 65 - 99  mg/dL   Comment 1 Capillary Specimen   Strep pneumoniae urinary antigen     Status: None   Collection Time: 03/05/17  4:19 PM  Result Value Ref Range   Strep Pneumo Urinary Antigen NEGATIVE NEGATIVE  Vancomycin, random     Status: None   Collection Time: 03/05/17  6:25 PM  Result Value Ref Range   Vancomycin Rm 11   Glucose, capillary     Status: None   Collection Time: 03/05/17  7:56 PM  Result Value Ref Range   Glucose-Capillary 93 65 - 99 mg/dL   Comment 1 Notify RN    Recent Results (from the past 240 hour(s))  Culture, blood (routine x 2)     Status: None (Preliminary result)   Collection Time: 02/14/2017  4:18 PM  Result Value Ref Range Status   Specimen Description BLOOD LEFT ANTECUBITAL  Final   Special Requests   Final    BOTTLES DRAWN AEROBIC AND ANAEROBIC Blood Culture results may not be optimal due to an excessive volume of blood received in culture bottles   Culture NO GROWTH < 24 HOURS  Final   Report Status PENDING  Incomplete  Culture, blood (routine x 2)     Status: None (Preliminary result)   Collection Time: 02/22/2017  4:21 PM  Result Value Ref Range Status   Specimen Description BLOOD RIGHT FOREARM  Final   Special Requests   Final    BOTTLES DRAWN AEROBIC AND ANAEROBIC Blood Culture results may not be optimal due to an excessive volume of blood received in culture bottles   Culture NO GROWTH < 24 HOURS  Final   Report Status PENDING  Incomplete  Respiratory Panel by PCR     Status: None   Collection Time: 02/19/2017 11:29 PM  Result Value Ref Range Status   Adenovirus NOT DETECTED NOT DETECTED Final   Coronavirus 229E NOT DETECTED NOT DETECTED Final   Coronavirus HKU1 NOT DETECTED NOT DETECTED Final   Coronavirus NL63 NOT DETECTED NOT DETECTED Final   Coronavirus OC43 NOT DETECTED NOT DETECTED Final   Metapneumovirus NOT DETECTED NOT DETECTED Final   Rhinovirus / Enterovirus NOT DETECTED NOT DETECTED Final   Influenza A NOT DETECTED NOT DETECTED Final    Influenza B NOT DETECTED NOT  DETECTED Final   Parainfluenza Virus 1 NOT DETECTED NOT DETECTED Final   Parainfluenza Virus 2 NOT DETECTED NOT DETECTED Final   Parainfluenza Virus 3 NOT DETECTED NOT DETECTED Final   Parainfluenza Virus 4 NOT DETECTED NOT DETECTED Final   Respiratory Syncytial Virus NOT DETECTED NOT DETECTED Final   Bordetella pertussis NOT DETECTED NOT DETECTED Final   Chlamydophila pneumoniae NOT DETECTED NOT DETECTED Final   Mycoplasma pneumoniae NOT DETECTED NOT DETECTED Final  MRSA PCR Screening     Status: None   Collection Time: 03/05/17  1:38 AM  Result Value Ref Range Status   MRSA by PCR NEGATIVE NEGATIVE Final    Comment:        The GeneXpert MRSA Assay (FDA approved for NASAL specimens only), is one component of a comprehensive MRSA colonization surveillance program. It is not intended to diagnose MRSA infection nor to guide or monitor treatment for MRSA infections.   Culture, respiratory (NON-Expectorated)     Status: None (Preliminary result)   Collection Time: 03/05/17  9:58 AM  Result Value Ref Range Status   Specimen Description TRACHEAL ASPIRATE  Final   Special Requests Normal  Final   Gram Stain   Final    ABUNDANT WBC PRESENT, PREDOMINANTLY PMN RARE SQUAMOUS EPITHELIAL CELLS PRESENT FEW GRAM POSITIVE COCCI IN CHAINS FEW YEAST WITH PSEUDOHYPHAE    Culture PENDING  Incomplete   Report Status PENDING  Incomplete   Creatinine: Recent Labs    02/16/2017 1551 03/05/17 0317  CREATININE 0.74 0.75   Procedure: Under sterile conditions I attempted to place but the daily catheter and a straight catheter.  This is difficult secondary to the patient's penile edema as well as some resistance that was met.  I therefore assembled the 58 French flexible cystoscope and under cystoscopic guidance I advanced the scope into the urethra and into the bladder.  The patient had no strictures.  He had significant prostatic hyperplasia with obstruction.  I was able  to navigate the scope into the bladder.  Visualization was somewhat limited at this point.  I was unable to perform a complete cystoscopy within the bladder.  I advanced a sensor wire through the scope and withdrew the scope.  I then advanced a 18 French silicone catheter over the wire and instilled 10 cc into the catheter balloon.  Patient tolerated procedure well.  Impression/Assessment:  Urinary retention Difficult Foley catheter placement  Plan:  Continue Foley catheter.  Once the patient is mobile and off the ventilator, can attempt a voiding trial.  Consider the addition of Flomax 0.4 mg daily.  Marton Redwood, III 03/05/2017, 9:32 PM

## 2017-03-05 NOTE — Procedures (Signed)
Central Venous Catheter Insertion Procedure Note Donald Reynolds [id number] 1925/07/27  Procedure: Insertion of Central Venous Catheter Indications: Assessment of intravascular volume, Drug and/or fluid administration and Frequent blood sampling  Procedure Details Consent: Unable to obtain consent because of emergent medical necessity. Time Out: Verified patient identification, verified procedure, site/side was marked, verified correct patient position, special equipment/implants available, medications/allergies/relevent history reviewed, required imaging and test results available.  Performed  Maximum sterile technique was used including antiseptics, cap, gloves, gown, hand hygiene, mask and sheet. Skin prep: Chlorhexidine; local anesthetic administered A antimicrobial bonded/coated triple lumen catheter was placed in the left internal jugular vein using the Seldinger technique.  Evaluation Blood flow good Complications: No apparent complications Patient did tolerate procedure well. Chest X-ray ordered to verify placement.  CXR: pending.  Donald Reynolds, AGACNP-BC [facility]  Pgr: [phone]  PCCM Pgr: [phone]

## 2017-03-05 NOTE — Progress Notes (Signed)
*  PRELIMINARY RESULTS* Echocardiogram 2D Echocardiogram has been performed.  Jeryl Columbialliott, Lindsey Demonte 03/05/2017, 11:12 AM

## 2017-03-05 NOTE — Plan of Care (Signed)
  Interdisciplinary Goals of Care Family Meeting   Date carried out:: 03/05/2017  Location of the meeting: Bedside  Member's involved: Physician, Bedside Registered Nurse and Family Member or next of kin  Durable Power of Attorney or acting medical decision maker: Nephew    Discussion: We discussed goals of care for [name] .  Updated nephew regarding vasopressor requirement and slow progress on ventilator. Awaiting CVP and foley catheter placement by Urology.  Code status: Limited Code or DNR with short term  Disposition: Continue current acute care  Time spent for the meeting: 6 minutes   Donald CousinsJennings Faustine Reynolds 03/05/2017, 6:28 PM

## 2017-03-05 NOTE — Progress Notes (Signed)
eLink Physician-Brief Progress Note Patient Name: Donald BerlinWilliam E Reynolds DOB: 29-Apr-1925 MRN: [id number]   Date of Service  03/05/2017  HPI/Events of Note  Notified of need for stress ulcer prophylaxis.   eICU Interventions  Will order Protonix IV.      Intervention Category Intermediate Interventions: Best-practice therapies (e.g. DVT, beta blocker, etc.)  Laasia Arcos Eugene 03/05/2017, 5:32 AM

## 2017-03-05 NOTE — Progress Notes (Addendum)
Pharmacy Antibiotic Note  Donald BerlinWilliam E Comas is a 83 y.o. male admitted on 03/08/2017 with pneumonia.  Pharmacy has been consulted for change of Zosyn to Ceftazidime dosing. Patient is currently on Vancomycin per pharmacy dosing and Azithromycin per MD.   SCr is stable at 0.75 but may be falsely elevated. Unable to quantify UOP at this time- bladder scan this AM with ~250 mL but this is since admission. No other urine occurrences except for on admission when urine was documented cloudy, orange, and maladorous. Edematous. Urology consulted.  Low temperatures. Hypotensive on Levophed.    Plan: D/C Zosyn Adjust Ceftazidime to 1g IV every 12 hours for now.  Hold Vancomycin for now and check level at 12 hour mark to determine if ok to give dose.  Continue Azithromycin 500 mg IV every 24 hours.  Monitor QTc on Azithromycin - F/up repeat 12L EKG in AM Monitor renal function, clinical status, and culture results.  Vancomycin trough at Css as appropriate  Height: 6' (182.9 cm) Weight: 188 lb 4.4 oz (85.4 kg) IBW/kg (Calculated) : 77.6  Temp (24hrs), Avg:95.1 F (35.1 C), Min:91.8 F (33.2 C), Max:97.7 F (36.5 C)  Recent Labs  Lab 02/23/2017 1551 03/13/2017 1613 03/05/17 0317  WBC 7.6  --  2.5*  CREATININE 0.74  --  0.75  LATICACIDVEN  --  1.52 1.1    Estimated Creatinine Clearance: 68.7 mL/min (by C-G formula based on SCr of 0.75 mg/dL).    No Known Allergies  Antimicrobials this admission: 11/19 Vanc >> 11/19 Zosyn >>11/20 11/20 Elita QuickFortaz >> 11/20 Azithromycin >>  Dose adjustments this admission:  Microbiology results: 11/19 MRSA pcr negative, Flu negative 11/19 Blood >> 11/19 RVP >> 11/20 Legionella >> 11/20 Sputum >>  Thank you for allowing pharmacy to be a part of this patient's care.  Link SnufferJessica Zayanna Pundt, PharmD, BCPS, BCCCP Clinical Pharmacist Clinical phone 03/05/2017 until 3:30PM [phone] After hours, please call [phone] 03/05/2017 12:18 PM

## 2017-03-05 NOTE — Procedures (Signed)
Intubation Procedure Note Donald Reynolds [id number] September 23, 1925  Procedure: Intubation Indications: Respiratory insufficiency  Procedure Details Consent: Unable to obtain consent because of emergent medical necessity. Time Out: Verified patient identification, verified procedure, site/side was marked, verified correct patient position, special equipment/implants available, medications/allergies/relevent history reviewed, required imaging and test results available.  {time out performed:4:00AM  Maximum sterile technique was used including gloves.  MAC    Evaluation Hemodynamic Status: BP stable throughout; O2 sats: stable throughout Patient's Current Condition: stable Complications: No apparent complications Patient did tolerate procedure well. Chest X-ray ordered to verify placement.  CXR: tube position acceptable.   Donald Reynolds Donald Reynolds 03/05/2017

## 2017-03-06 ENCOUNTER — Encounter (HOSPITAL_COMMUNITY): Payer: Medicare HMO

## 2017-03-06 ENCOUNTER — Inpatient Hospital Stay (HOSPITAL_COMMUNITY): Payer: Medicare HMO

## 2017-03-06 DIAGNOSIS — J9601 Acute respiratory failure with hypoxia: Secondary | ICD-10-CM

## 2017-03-06 LAB — GLUCOSE, CAPILLARY
GLUCOSE-CAPILLARY: 117 mg/dL — AB (ref 65–99)
GLUCOSE-CAPILLARY: 76 mg/dL (ref 65–99)
GLUCOSE-CAPILLARY: 91 mg/dL (ref 65–99)
Glucose-Capillary: 108 mg/dL — ABNORMAL HIGH (ref 65–99)
Glucose-Capillary: 117 mg/dL — ABNORMAL HIGH (ref 65–99)

## 2017-03-06 LAB — RENAL FUNCTION PANEL
Albumin: 2.3 g/dL — ABNORMAL LOW (ref 3.5–5.0)
Anion gap: 5 (ref 5–15)
BUN: 22 mg/dL — AB (ref 6–20)
CALCIUM: 8.6 mg/dL — AB (ref 8.9–10.3)
CHLORIDE: 99 mmol/L — AB (ref 101–111)
CO2: 26 mmol/L (ref 22–32)
CREATININE: 0.92 mg/dL (ref 0.61–1.24)
GFR calc Af Amer: >60 mL/min (ref >60)
GFR calc non Af Amer: >60 mL/min (ref >60)
Glucose, Bld: 99 mg/dL (ref 65–99)
Phosphorus: 3.5 mg/dL (ref 2.5–4.6)
Potassium: 4.2 mmol/L (ref 3.5–5.1)
SODIUM: 130 mmol/L — AB (ref 135–145)

## 2017-03-06 LAB — CBC WITH DIFFERENTIAL/PLATELET
Basophils Absolute: 0 10*3/uL (ref 0.0–0.1)
Basophils Relative: 0 %
EOS PCT: 0 %
Eosinophils Absolute: 0 10*3/uL (ref 0.0–0.7)
HCT: 27.4 % — ABNORMAL LOW (ref 39.0–52.0)
Hemoglobin: 9.6 g/dL — ABNORMAL LOW (ref 13.0–17.0)
LYMPHS ABS: 0.3 10*3/uL — AB (ref 0.7–4.0)
LYMPHS PCT: 3 %
MCH: 31.7 pg (ref 26.0–34.0)
MCHC: 35 g/dL (ref 30.0–36.0)
MCV: 90.4 fL (ref 78.0–100.0)
MONO ABS: 0.1 10*3/uL (ref 0.1–1.0)
MONOS PCT: 2 %
Neutro Abs: 7.2 10*3/uL (ref 1.7–7.7)
Neutrophils Relative %: 95 %
PLATELETS: 95 10*3/uL — AB (ref 150–400)
RBC: 3.03 MIL/uL — ABNORMAL LOW (ref 4.22–5.81)
RDW: 15.8 % — AB (ref 11.5–15.5)
WBC: 7.6 10*3/uL (ref 4.0–10.5)

## 2017-03-06 LAB — HEPATIC FUNCTION PANEL
ALT: 30 U/L (ref 17–63)
AST: 25 U/L (ref 15–41)
Albumin: 2.3 g/dL — ABNORMAL LOW (ref 3.5–5.0)
Alkaline Phosphatase: 92 U/L (ref 38–126)
BILIRUBIN DIRECT: 0.2 mg/dL (ref 0.1–0.5)
BILIRUBIN INDIRECT: 0.8 mg/dL (ref 0.3–0.9)
Total Bilirubin: 1 mg/dL (ref 0.3–1.2)
Total Protein: 4.7 g/dL — ABNORMAL LOW (ref 6.5–8.1)

## 2017-03-06 LAB — PHOSPHORUS
Phosphorus: 2.3 mg/dL — ABNORMAL LOW (ref 2.5–4.6)
Phosphorus: 2.9 mg/dL (ref 2.5–4.6)

## 2017-03-06 LAB — MAGNESIUM
MAGNESIUM: 1.3 mg/dL — AB (ref 1.7–2.4)
MAGNESIUM: 2.7 mg/dL — AB (ref 1.7–2.4)
Magnesium: 1.8 mg/dL (ref 1.7–2.4)

## 2017-03-06 LAB — LEGIONELLA PNEUMOPHILA SEROGP 1 UR AG: L. PNEUMOPHILA SEROGP 1 UR AG: NEGATIVE

## 2017-03-06 LAB — PROCALCITONIN: PROCALCITONIN: 1.32 ng/mL

## 2017-03-06 MED ORDER — VITAL AF 1.2 CAL PO LIQD
1000.0000 mL | ORAL | Status: DC
Start: 2017-03-06 — End: 2017-03-11
  Administered 2017-03-06 – 2017-03-10 (×6): 1000 mL
  Filled 2017-03-06 (×3): qty 1000

## 2017-03-06 MED ORDER — PANTOPRAZOLE SODIUM 40 MG PO PACK
40.0000 mg | PACK | Freq: Every day | ORAL | Status: DC
  Administered 2017-03-07 – 2017-03-11 (×5): 40 mg
  Filled 2017-03-06 (×5): qty 20

## 2017-03-06 MED ORDER — PRO-STAT SUGAR FREE PO LIQD
30.0000 mL | Freq: Two times a day (BID) | ORAL | Status: DC
  Administered 2017-03-06 – 2017-03-11 (×11): 30 mL
  Filled 2017-03-06 (×13): qty 30

## 2017-03-06 MED ORDER — ONDANSETRON HCL 4 MG/2ML IJ SOLN
4.0000 mg | Freq: Four times a day (QID) | INTRAMUSCULAR | Status: DC | PRN
  Administered 2017-03-06: 4 mg via INTRAVENOUS
  Filled 2017-03-06: qty 2

## 2017-03-06 MED ORDER — SENNOSIDES 8.8 MG/5ML PO SYRP
5.0000 mL | ORAL_SOLUTION | Freq: Two times a day (BID) | ORAL | Status: DC
  Administered 2017-03-06: 5 mL
  Filled 2017-03-06: qty 5

## 2017-03-06 MED ORDER — DEXTROSE 5 % IV SOLN
1.0000 g | Freq: Three times a day (TID) | INTRAVENOUS | Status: DC
  Administered 2017-03-06 – 2017-03-08 (×6): 1 g via INTRAVENOUS
  Filled 2017-03-06 (×8): qty 1

## 2017-03-06 MED ORDER — DOCUSATE SODIUM 50 MG/5ML PO LIQD
100.0000 mg | Freq: Two times a day (BID) | ORAL | Status: DC
  Administered 2017-03-06: 100 mg
  Filled 2017-03-06: qty 10

## 2017-03-06 MED ORDER — MAGNESIUM SULFATE 4 GM/100ML IV SOLN
4.0000 g | Freq: Once | INTRAVENOUS | Status: AC
  Administered 2017-03-06: 4 g via INTRAVENOUS
  Filled 2017-03-06: qty 100

## 2017-03-06 NOTE — Progress Notes (Signed)
PULMONARY / CRITICAL CARE MEDICINE   Name: Donald Reynolds MRN: [id number] DOB: 1925/06/22    ADMISSION DATE:  14-Aug-2016 CONSULTATION DATE:  14-Aug-2016  REFERRING MD:  Dr. Effie ShyWentz   CHIEF COMPLAINT:  AMS  HISTORY OF PRESENT ILLNESS:   Patient is an 83 year old male with PMH of CHF, CAD, DVT, A.Fib (history of pradaxa), HTN, Dementia, and Seizure Disorder (diagnosed in 20's and treated with phenobarbital-seizure free >50 years). Presents to ED from SNF with increased confusion and bilateral upper/lower extremity edema and facial swelling. Upon arrival to ED patient is hypothermic (Temp 91.8 Rectal), Hypotensive (Systolic 80-90), and Bradycardia (HR 40-50). LA 1.52, WBC 7.6. Patient given 2.5L NS and Vancomycin/Zosyn and ultimately Neo Gtt. After fluid became hypoxic requiring NRB. CXR with small pleural effusions and hazy lower lung zone airspace opacity consistent with heart failure. PCCM asked to admit. Patient is very difficult to understand but was able to deny having any chest pain or shortness of breath. Told the ED staff he wanted pancakes.  PAST MEDICAL HISTORY :  He  has a past medical history of Acute renal failure (HCC), Anemia, Arthritis, Atrial fibrillation (HCC), CHF (congestive heart failure) (HCC), Coronary artery disease, Deep venous thrombosis (HCC), Dementia, Diarrhea, Enlarged prostate, Heart murmur, Hiatal hernia (2003), History of seizure disorder, Hypertension, Hypokalemia, Nausea & vomiting, Seizures (HCC), and Vitiligo.  PAST SURGICAL HISTORY: He  has a past surgical history that includes Hernia repair.  No Known Allergies  No current facility-administered medications on file prior to encounter.    Current Outpatient Medications on File Prior to Encounter  Medication Sig  . Cholecalciferol (VITAMIN D) 2000 units CAPS Take 2,000 Units daily by mouth.   . dabigatran (PRADAXA) 150 MG CAPS capsule Take 1 capsule (150 mg total) by mouth every 12 (twelve) hours.  .  fesoterodine (TOVIAZ) 4 MG TB24 tablet Take 4 mg by mouth daily.   . iron polysaccharides (NIFEREX) 150 MG capsule Take 150 mg by mouth daily.  Marland Kitchen. PHENobarbital (LUMINAL) 97.2 MG tablet Take 97.2 mg by mouth daily.   . tamsulosin (FLOMAX) 0.4 MG CAPS capsule Take 0.4 mg by mouth daily.   . vitamin B-12 (CYANOCOBALAMIN) 500 MCG tablet Take 500 mcg by mouth daily.     FAMILY HISTORY:  His indicated that his mother is deceased. He indicated that his father is deceased. He indicated that his brother is deceased. He indicated that his maternal grandmother is deceased. He indicated that his maternal grandfather is deceased. He indicated that his paternal grandmother is deceased. He indicated that his paternal grandfather is deceased. He indicated that the status of his neg hx is unknown. He indicated that the status of his other is unknown.   SOCIAL HISTORY: He  reports that he has quit smoking. he has never used smokeless tobacco. He reports that he does not drink alcohol or use drugs.  REVIEW OF SYSTEMS:   Unable to obtain given sedation with intubation.  SUBJECTIVE:  Remains hypothermic (94.75F) overnight with stable vitals on pressors and full vent support.  VITAL SIGNS: BP (!) 84/69   Pulse (!) 55   Temp (!) 94.2 F (34.6 C) (Oral)   Resp 11   Ht 6' (1.829 m)   Wt 184 lb 11.9 oz (83.8 kg)   SpO2 100%   BMI 25.06 kg/m   HEMODYNAMICS: CVP:  [13 mmHg] 13 mmHg  VENTILATOR SETTINGS: Vent Mode: PRVC FiO2 (%):  [40 %-100 %] 40 % Set Rate:  [12 bmp] 12 bmp Vt  Set:  [470 mL] 470 mL PEEP:  [12 cmH20-16 cmH20] 14 cmH20 Plateau Pressure:  [14 cmH20-20 cmH20] 19 cmH20  INTAKE / OUTPUT: I/O last 3 completed shifts: In: 3290.8 [I.V.:1090.8; IV Piggyback:2200] Out: 825 [Urine:825]  UOP: 0.825 L over last 24 hours  PHYSICAL EXAMINATION: General: elderly male, NAD HEENT: normocephalic, atraumatic, moist mucous membranes Neck: supple, non-tender without lymphadenopathy Cardiovascular:  regular rate and rhythm without murmurs, rubs, or gallops Lungs: crackles bilaterally with normal work of breathing Abdomen: soft, non-tender, non-distended, normoactive bowel sounds Skin: warm, dry, no rashes or lesions, cap refill < 2 seconds Extremities: warm and well perfused, normal tone, 1+ bilateral lower extremity edema Neuro: able to move extremities, following commands intermittently, PERRLA  LABS:  BMET Recent Labs  Lab 03/09/2017 1551 03/05/17 0317 03/06/17 0414  NA 131* 131* 130*  K 4.1 3.9 4.2  CL 99* 102 99*  CO2 26 22 26   BUN 21* 21* 22*  CREATININE 0.74 0.75 0.92  GLUCOSE 66 76 99    Electrolytes Recent Labs  Lab 03/08/2017 1551 03/05/17 0317 03/06/17 0414  CALCIUM 9.0 8.7* 8.6*  MG 1.7 1.8 1.8  PHOS  --  3.0 3.5    CBC Recent Labs  Lab 02/26/2017 1551 03/05/17 0317 03/05/17 0932 03/06/17 0414  WBC 7.6 2.5*  --  7.6  HGB 9.9* 10.0*  --  9.6*  HCT 28.0* 28.5*  --  27.4*  PLT 88* 90* 109* 95*    Coag's Recent Labs  Lab 03/05/17 0932  APTT 104*  INR 1.72    Sepsis Markers Recent Labs  Lab 03/01/2017 1551 03/08/2017 1613 03/05/17 0317 03/06/17 0414  LATICACIDVEN  --  1.52 1.1  --   PROCALCITON 0.65  --  0.24 1.32    ABG Recent Labs  Lab 03/05/17 0011 03/05/17 0200 03/05/17 0529  PHART 7.421 7.430 7.391  PCO2ART 38.3 36.7 38.0  PO2ART 60.0* 49.6* 50.0*    Liver Enzymes Recent Labs  Lab 03/06/2017 1551 03/06/17 0414  AST 45* 25  ALT 40 30  ALKPHOS 110 92  BILITOT 0.6 1.0  ALBUMIN 2.8* 2.3*  2.3*    Cardiac Enzymes No results for input(s): TROPONINI, PROBNP in the last 168 hours.  Glucose Recent Labs  Lab 03/05/17 0144 03/05/17 0755 03/05/17 1211 03/05/17 1514 03/05/17 1956 03/05/17 2358  GLUCAP 96 94 117* 154* 93 117*    Imaging No results found.   STUDIES:  DG Chest 2 View (11/19): Cardiomegaly with interstitial thickening, hazy lower lung zone airspace opacities and small pleural effusions consistent with  CHF, soft tissue enlargement superior mediastinum could reflect mass or adenopathy, recommend follow-up chest CT DG Abd Portable 1V (11/20): Stable NG tube and ETT, right perihilar and basilar infiltration likely representing pneumonia versus asymmetrical edema, probable right pleural effusion DG CHEST PORT 1 VIEW (11/20): Cardiac enlargement, airspace disease in right lung likely pneumonia, could consider asymmetrical edema, progression since previous study with probable small pleural effusions greater on right DG CHEST PORT 1 VIEW (11/20): Confluent alveolar opacity in the right mid and lower lung worrisome for pneumonia vs asymmetric pulmonary edema, moderate size right pleural effusion layering posteriorly, cardiomegaly without significant pulmonary vascular congestion Transthoracic Echocardiography (11/20): EF 50-55%, no wall motion abnormalities, LA and RA severely dilated  CULTURES: Blood culture x2 (11/19): NGTD RVP PCR (11/19): Negative MRSA PCR (11/19): Negative Respiratory culture (11/20): Pending Strep pneumo urine (11/20): Negative Legionella urine (11/20): Pending  ANTIBIOTICS: Zosyn 11/19-11/20 Vancomycin 11/19 >> Ceftazidime 11/20 >> Azithromycin 11/20 >>  SIGNIFICANT EVENTS: 11/19 admit and intubate for AMS, hypothermia, hypotension 11/20 switched Zosyn to ceftazidime and azithromycin  LINES/TUBES: ETT 11/20 >> LEJ triple lumen 11/20 >> PIV R-forearm 11/19 >> PIV L-forearm 11/19 >> NG/OG 11/20 >> Urethral cath 11/20 >> Ext urinary cath 11/20-11/20  DISCUSSION: Patient is a PMH of CHF, CAD, DVT, A. fib, HTN, dementia, seizure disorder presenting from SNF with AMS, hypothermia, hypotension, bradycardia and bilateral upper/lower extremity edema and facial swelling requiring intubation on 11/20.  ASSESSMENT / PLAN:  PULMONARY Acute hypoxic respiratory failure: Likely HAP though there may be some component of heart failure exacerbation - Full vent support, wean as  tolerated keeping sats >92% - Pulmonary hygiene - Treatment for HAP per below  CARDIOVASCULAR Acute on chronic heart failure (no echo per EMR): Likely contributing to respiratory failure Bradycardia with hypotension: Likely sepsis versus cardiogenic shock (BNP 300 on admission) H/o HTN, CAD, HLD, A. fib - Telemetry - Phenylephrine, keep MAP >65 & SBP >90 - Echocardiogram pending - Holding home Pradaxa due to no mention on med list though found on cardiology note 4/17  RENAL Hypomagnesemia: Improving Hyponatremia  - Trend RFP and mag level, replace electrolytes as indicated - KVO - Continue home Flomax - Daily weights / strict I&Os  GASTROINTESTINAL Nutrition - NPO - Protonix IV for SUP  HEMATOLOGIC Normocytic anemia: Hemoglobin stable signs of active bleeding Thrombocytopenia: Uncertain etiology though infection may be contributing H/o DVT - Trend CBC, DIC panel pending - Pending LE US - Heparin SQ  INFECTIOUS  Sepsis shock secondary to HAP: Negative UA and influenza on admission - Pending legionella and finalization of blood culture - Trend fever curve / WBC / PCT - Day 2 of vancomycin / ceftazidime / azithromycin  ENDOCRINE Hypoglycemia: Low 90s-100, cortisol 24.1 on admission H/o elevated TSH: TSH WNL on admission - Trending CBGs  NEUROLOGIC Dementia H/o multiple falls, seizures - RASS goal: 0 to -1 - Continue home phenobarbital per tube - Sedation: Fentanyl PRN / midazolam PRN   FAMILY  - Updates: nephew at bedside this morning (only family member), decision to make partial DNR (no chest compressions) - Inter-disciplinary family meet or Palliative Care meeting due by: 03/11/2017   Durward Parcelavid McMullen, DO [place]

## 2017-03-06 NOTE — Progress Notes (Signed)
Pharmacy Antibiotic Note  Donald Reynolds is a 83 y.o. male admitted on 03/07/2017 with Staph pneumonia/sepsis to continue on vancomycin, ceftazidime and azithromycin.  Patient's renal function bumped slightly but CrCL remains > 50 ml/min with decent UOP.  He is hypothermic and his WBC is WNL.  Plan: Continue vanc 750mg  IV Q8H Increase Fortaz to 1gm IV Q8H.  F/U with narrowing. Continue azithromycin 500mg  IV Q24H per MD.  Await legionella result. Monitor renal fxn, micro data, vanc trough at true Css   Height: 6' (182.9 cm) Weight: 184 lb 11.9 oz (83.8 kg) IBW/kg (Calculated) : 77.6  Temp (24hrs), Avg:95.5 F (35.3 C), Min:94 F (34.4 C), Max:97.7 F (36.5 C)  Recent Labs  Lab Aug 26, 2016 1551 Aug 26, 2016 1613 03/05/17 0317 03/05/17 1825 03/06/17 0414  WBC 7.6  --  2.5*  --  7.6  CREATININE 0.74  --  0.75  --  0.92  LATICACIDVEN  --  1.52 1.1  --   --   VANCORANDOM  --   --   --  11  --     Estimated Creatinine Clearance: 59.7 mL/min (by C-G formula based on SCr of 0.92 mg/dL).    No Known Allergies   11/19 Vanc >> 11/19 Zosyn >>111/20 11/20 Fortaz >> 11/20 Azith >>  11/20 VL = 11 mcg/mL post 1g load and one 750mg  dose >> con't 750mg  q12  11/19 MRSA pcr negative, Flu negative 11/19 BCx - NGTD 11/19 RVP - negative 11/20 Legionella >> 11/20 TA - Staph aureus (preliminary) 11/21 UCx -    Jewelianna Pancoast D. Laney Potashang, PharmD, BCPS Pager:  [phone] 03/06/2017, 2:08 PM

## 2017-03-06 NOTE — Progress Notes (Signed)
Initial Nutrition Assessment  DOCUMENTATION CODES:   Not applicable  INTERVENTION:    Vital AF 1.2 at 55 ml/h (1320 ml per day)  Pro-stat 30 ml BID  Provides 1784 kcal, 129 gm protein, 1071 ml free water daily  NUTRITION DIAGNOSIS:   Inadequate oral intake related to inability to eat as evidenced by NPO status.  GOAL:   Patient will meet greater than or equal to 90% of their needs  MONITOR:   TF tolerance, Vent status, Labs, I & O's  REASON FOR ASSESSMENT:   Ventilator, Consult Enteral/tube feeding initiation and management  ASSESSMENT:   83 yo male with PMH of HTN, CAD, seizure D/O, A fib, CHF, baseline intellectual handicap, who was admitted from SNF on 11/19 with septic shock d/t pneumonia, anasarca. Required intubation on 11/20.  Received MD Consult for TF initiation and management. Patient is currently intubated on ventilator support MV: 9.8 L/min Temp (24hrs), Avg:96 F (35.6 C), Min:94 F (34.4 C), Max:97.7 F (36.5 C)  Labs reviewed. Sodium 130 (L) Medications reviewed and include phenobarbital.  I/O +3.5 L since admission Suspect current weight is above actual weight given severe edema & 3.5 L + since admission. Given severe edema and moderate depletion of muscle mass, patient is at risk for malnutrition.  NUTRITION - FOCUSED PHYSICAL EXAM:    Most Recent Value  Orbital Region  No depletion  Upper Arm Region  Unable to assess  Thoracic and Lumbar Region  Unable to assess  Buccal Region  Unable to assess  Temple Region  Moderate depletion  Clavicle Bone Region  Moderate depletion  Clavicle and Acromion Bone Region  Moderate depletion  Scapular Bone Region  Unable to assess  Dorsal Hand  Unable to assess  Patellar Region  No depletion  Anterior Thigh Region  No depletion  Posterior Calf Region  Unable to assess  Edema (RD Assessment)  Severe  Hair  Reviewed  Eyes  Reviewed  Mouth  Unable to assess  Skin  Reviewed  Nails  Reviewed        Diet Order:  No diet orders on file  EDUCATION NEEDS:   No education needs have been identified at this time  Skin:  Skin Assessment: Skin Integrity Issues: Skin Integrity Issues:: Other (Comment) Other: MASD to buttocks  Last BM:  PTA  Height:   Ht Readings from Last 1 Encounters:  03/05/17 6' (1.829 m)    Weight:   Wt Readings from Last 1 Encounters:  03/06/17 184 lb 11.9 oz (83.8 kg)    Ideal Body Weight:  80.9 kg  BMI:  Body mass index is 25.06 kg/m.  Estimated Nutritional Needs:   Kcal:  1750  Protein:  120-130 gm  Fluid:  1.8 L   Joaquin CourtsKimberly Harris, RD, LDN, CNSC Pager [phone] After Hours Pager [phone]

## 2017-03-07 ENCOUNTER — Inpatient Hospital Stay (HOSPITAL_COMMUNITY): Payer: Medicare HMO

## 2017-03-07 ENCOUNTER — Encounter (HOSPITAL_COMMUNITY): Payer: Medicare HMO

## 2017-03-07 DIAGNOSIS — R609 Edema, unspecified: Secondary | ICD-10-CM

## 2017-03-07 LAB — GLUCOSE, CAPILLARY
GLUCOSE-CAPILLARY: 103 mg/dL — AB (ref 65–99)
GLUCOSE-CAPILLARY: 106 mg/dL — AB (ref 65–99)
GLUCOSE-CAPILLARY: 108 mg/dL — AB (ref 65–99)
GLUCOSE-CAPILLARY: 110 mg/dL — AB (ref 65–99)
GLUCOSE-CAPILLARY: 113 mg/dL — AB (ref 65–99)
GLUCOSE-CAPILLARY: 113 mg/dL — AB (ref 65–99)
GLUCOSE-CAPILLARY: 82 mg/dL (ref 65–99)

## 2017-03-07 LAB — RENAL FUNCTION PANEL
ANION GAP: 5 (ref 5–15)
Albumin: 2.2 g/dL — ABNORMAL LOW (ref 3.5–5.0)
BUN: 27 mg/dL — ABNORMAL HIGH (ref 6–20)
CALCIUM: 8.6 mg/dL — AB (ref 8.9–10.3)
CHLORIDE: 99 mmol/L — AB (ref 101–111)
CO2: 25 mmol/L (ref 22–32)
Creatinine, Ser: 1.01 mg/dL (ref 0.61–1.24)
GFR calc Af Amer: >60 mL/min (ref >60)
GFR calc non Af Amer: >60 mL/min (ref >60)
GLUCOSE: 123 mg/dL — AB (ref 65–99)
POTASSIUM: 4.3 mmol/L (ref 3.5–5.1)
Phosphorus: 2.9 mg/dL (ref 2.5–4.6)
SODIUM: 129 mmol/L — AB (ref 135–145)

## 2017-03-07 LAB — CBC WITH DIFFERENTIAL/PLATELET
Basophils Absolute: 0 10*3/uL (ref 0.0–0.1)
Basophils Relative: 0 %
Eosinophils Absolute: 0 10*3/uL (ref 0.0–0.7)
Eosinophils Relative: 0 %
HEMATOCRIT: 25.9 % — AB (ref 39.0–52.0)
HEMOGLOBIN: 9.1 g/dL — AB (ref 13.0–17.0)
LYMPHS ABS: 0.2 10*3/uL — AB (ref 0.7–4.0)
LYMPHS PCT: 3 %
MCH: 31.4 pg (ref 26.0–34.0)
MCHC: 35.1 g/dL (ref 30.0–36.0)
MCV: 89.3 fL (ref 78.0–100.0)
Monocytes Absolute: 0.3 10*3/uL (ref 0.1–1.0)
Monocytes Relative: 3 %
NEUTROS ABS: 8.5 10*3/uL — AB (ref 1.7–7.7)
NEUTROS PCT: 94 %
Platelets: 102 10*3/uL — ABNORMAL LOW (ref 150–400)
RBC: 2.9 MIL/uL — AB (ref 4.22–5.81)
RDW: 15.3 % (ref 11.5–15.5)
WBC: 9 10*3/uL (ref 4.0–10.5)

## 2017-03-07 LAB — URINE CULTURE
Culture: NO GROWTH
Special Requests: NORMAL

## 2017-03-07 LAB — MAGNESIUM
Magnesium: 2.3 mg/dL (ref 1.7–2.4)
Magnesium: 2.1 mg/dL (ref 1.7–2.4)

## 2017-03-07 LAB — CULTURE, RESPIRATORY: SPECIAL REQUESTS: NORMAL

## 2017-03-07 LAB — PHOSPHORUS: PHOSPHORUS: 2.4 mg/dL — AB (ref 2.5–4.6)

## 2017-03-07 LAB — CULTURE, RESPIRATORY W GRAM STAIN

## 2017-03-07 NOTE — Progress Notes (Signed)
[facility] Attending Note  ADMISSION DATE:02/19/2017  CONSULTATION DATE:03/10/2017  REFERRING NW:GNFAOZH Eulis Foster, M.D. / EDP  CHIEF COMPLAINT:Sepsis  Presenting HPI:  83 y.o. male with known history of congestive heart failure, coronary artery disease, atrial fibrillation, seizure disorder, and baseline intellectual handicap. He does not have dementia. Patient presented from skilled nursing facility with increased confusion, anasarca, shock, and hypothermia. Clinical picture highly suspicious for sepsis. Patient was given empiric Candida biotics with vancomycin and Zosyn as well as bolus IV fluid. With worsening respiratory status he ultimately required endotracheal intubation after admission to the ICU. He also had central venous catheter placed for vasopressor infusion.  Subjective:  No acute events overnight. Remains on vasopressor support.  Review of Systems:  Unable to obtain given intubation & sedation.   Vent Mode: CPAP;PSV FiO2 (%):  [40 %] 40 % Set Rate:  [12 bmp] 12 bmp Vt Set:  [470 mL] 470 mL PEEP:  [5 cmH20-10 cmH20] 5 cmH20 Pressure Support:  [8 cmH20] 8 cmH20 Plateau Pressure:  [13 YQM57-84 cmH20] 14 cmH20  Temp:  [94.5 F (34.7 C)-100.9 F (38.3 C)] 98.7 F (37.1 C) (11/22 0809) Pulse Rate:  [48-86] 68 (11/22 0822) Resp:  [6-27] 14 (11/22 0700) BP: (82-111)/(51-78) 99/74 (11/22 0822) SpO2:  [96 %-100 %] 100 % (11/22 0700) FiO2 (%):  [40 %] 40 % (11/22 0822) Weight:  [188 lb 0.8 oz (85.3 kg)] 188 lb 0.8 oz (85.3 kg) (11/22 0314)  General:  No family at bedside. Intubated. No distress. Integument:  Warm & dry. No rash on exposed skin. HEENT:  Moist mucus memebranes. No scleral icterus. Endotracheal tube in place. Neurological:  Pupils symmetric. Nonsteroidal questions. Awakens easily to voice. Pulmonary:  Symmetric chest wall rise on ventilator. Clear breath sounds bilaterally. Cardiovascular:  Regular rate. No appreciable JVD.  Anasarca. Abdomen:  Soft. Nondistended. Normoactive bowel sounds.  LINES/TUBES: OETT 11/20 >>> L IJ CVL 11/20 >>> Foley 11/20 >>> OGT 11/20 >>> PIV  CBC Latest Ref Rng & Units 03/07/2017 03/06/2017 03/05/2017  WBC 4.0 - 10.5 K/uL 9.0 7.6 -  Hemoglobin 13.0 - 17.0 g/dL 9.1(L) 9.6(L) -  Hematocrit 39.0 - 52.0 % 25.9(L) 27.4(L) -  Platelets 150 - 400 K/uL 102(L) 95(L) 109(L)    BMP Latest Ref Rng & Units 03/07/2017 03/06/2017 03/05/2017  Glucose 65 - 99 mg/dL 123(H) 99 76  BUN 6 - 20 mg/dL 27(H) 22(H) 21(H)  Creatinine 0.61 - 1.24 mg/dL 1.01 0.92 0.75  Sodium 135 - 145 mmol/L 129(L) 130(L) 131(L)  Potassium 3.5 - 5.1 mmol/L 4.3 4.2 3.9  Chloride 101 - 111 mmol/L 99(L) 99(L) 102  CO2 22 - 32 mmol/L 25 26 22   Calcium 8.9 - 10.3 mg/dL 8.6(L) 8.6(L) 8.7(L)    Hepatic Function Latest Ref Rng & Units 03/07/2017 03/06/2017 03/06/2017  Total Protein 6.5 - 8.1 g/dL - 4.7(L) -  Albumin 3.5 - 5.0 g/dL 2.2(L) 2.3(L) 2.3(L)  AST 15 - 41 U/L - 25 -  ALT 17 - 63 U/L - 30 -  Alk Phosphatase 38 - 126 U/L - 92 -  Total Bilirubin 0.3 - 1.2 mg/dL - 1.0 -  Bilirubin, Direct 0.1 - 0.5 mg/dL - 0.2 -    IMAGING/STUDIES: TTE 11/20:  LV normal in size with EF 50-55%. No regional wall motion abnormalities. Unable to assess diastolic function. LA & RA severely dilated. RV moderately dilated with preserved systolic function. Trivial aortic regurgitation without stenosis. Aortic root normal in size. Mild mitral regurgitation without stenosis. No  significant pulmonic regurgitation or stenosis with poorly visualized valve. Moderate tricuspid regurgitation. Small pericardial effusion circumferential to the heart. PORT CXR 11/20:  Previously reviewed by me. Endotracheal tube in good position. Enteric feeding tube coursing below diaphragm. Right-sided mid to lower lung predominant patchy opacification with alveolar filling. Cannot rule out right-sided pleural effusion. Patchy left midlung alveolar opacity as well.  Left internal jugular central venous catheter in good position. PORT CXR 11/22:  Personally reviewed by me. Left internal jugular central venous catheter in good position. Endotracheal tube in good position. Enteric feeding tube coursing below diaphragm. Film rotated slightly to the right. Persistent right lung patchy opacification with left lower lung opacification as well consistent with multifocal pneumonia. VENOUS DUPLEX 11/22 >>>  MICROBIOLOGY: MRSA PCR 11/20:  Negative  Blood Cultures x2 11/19 >>> Tracheal Aspirate Culture 11/20 >>> Few Staph aureus  Respiratory Panel PCR 11/19:  Negative  Influenza A/B PCR 11/19:  Negative  Urine Streptococcal Antigen 11/20:  Negative  Urine Legionella Antigen 11/20:  Negative  Urine Culture 11/21:  Negative   ANTIBIOTICS: Zosyn 11/19 - 11/20 Azithromycin 11/20 - 11/21 Vancomycin 11/19 >>> Fortaz 11/20 >>>  SIGNIFICANT EVENTS: 11/19 - Admit 11/20 - Worsening oxygenation >> intubated 11/21 - Improving oxygenation & decreasing pressor requirement. More awake.   ASSESSMENT/PLAN:  83 y.o. male with septic shock secondary to healthcare associated pneumonia. Continuing to require vasopressor support.  1. Septic shock: Secondary to pneumonia. Weaning Levophed to maintain mean arterial pressure. Awaiting finalization of culture result. 2. Healthcare associated pneumonia: Continuing empiric vancomycin and Fortaz while awaiting tracheal aspirate culture result. 3. Acute hypoxic respiratory failure: Secondary to pneumonia. Weaning ventilator support as tolerated to maintain saturation. Continuous pulse oximetry monitoring. Consider gentle diuresis with Lasix tomorrow. 4. Thrombocytopenia: Improving. Likely secondary to sepsis with splenic sequestration as well as low level DIC. Trending cell counts daily. 5. Anemia: Hemoglobin stable. No signs of active bleeding. Trending cell counts daily with CBC. 6. Urinary retention: Holding Flomax. Known history  of BPH. Continuing Foley catheter. 7. Hyponatremia: Mild. Stable. Continuing to monitor electrolytes daily. 8. Borderline hypoglycemia: Improved. Monitoring Accu-Cheks every 4 hours with M.D. notification parameters. 9. Bradycardia: Resolved. 10. Epilepsy: Continuing phenobarbital. Continuing seizure precautions & Versed IV as necessary for any seizure. 11. History of atrial fibrillation: Holding systemic anticoagulation. Monitoring patient on telemetry. 12. Chronic diastolic congestive heart failure: No signs of acute decompensation. Holding diuresis given shock. 13. Prolonged QTc: Resolved. 14. Hypomagnesemia: Resolved.  Prophylaxis:  Heparin Beaver q8hr, SCDs, & Protonix via tube daily. Diet:  NPO. Dietary consulted for tube feeding recommendations. Code Status:  Limited Code - refer to Plan of Care Documentation. Disposition:  Remains critically ill with guarded prognosis. Family Update:  No family at bedside today. Nephew last updated 11/20.  I have personally spent a total of 32 minutes of critical care time today caring for the patient & reviewing the patient's electronic medical record.  Sonia Baller Ashok Cordia, M.D. [facility] Pager:  [phone] After 7pm or if no response, call [phone] 10:31 AM 03/07/17

## 2017-03-07 NOTE — Progress Notes (Signed)
VASCULAR LAB PRELIMINARY  PRELIMINARY  PRELIMINARY  PRELIMINARY  Bilateral lower extremity venous duplex completed.    Preliminary report:  There is no DVT or SVT noted in the bilateral lower extremities.  Interstitial fluid noted throughout the bilateral lower extremities.   Chen Saadeh, RVT 03/07/2017, 9:21 AM

## 2017-03-08 LAB — GLUCOSE, CAPILLARY
GLUCOSE-CAPILLARY: 119 mg/dL — AB (ref 65–99)
GLUCOSE-CAPILLARY: 124 mg/dL — AB (ref 65–99)
Glucose-Capillary: 95 mg/dL (ref 65–99)
Glucose-Capillary: 97 mg/dL (ref 65–99)

## 2017-03-08 LAB — CBC WITH DIFFERENTIAL/PLATELET
Basophils Absolute: 0 10*3/uL (ref 0.0–0.1)
Basophils Relative: 0 %
EOS ABS: 0 10*3/uL (ref 0.0–0.7)
Eosinophils Relative: 0 %
HEMATOCRIT: 25.4 % — AB (ref 39.0–52.0)
HEMOGLOBIN: 8.9 g/dL — AB (ref 13.0–17.0)
LYMPHS ABS: 0.1 10*3/uL — AB (ref 0.7–4.0)
LYMPHS PCT: 2 %
MCH: 31.8 pg (ref 26.0–34.0)
MCHC: 35 g/dL (ref 30.0–36.0)
MCV: 90.7 fL (ref 78.0–100.0)
MONOS PCT: 5 %
Monocytes Absolute: 0.4 10*3/uL (ref 0.1–1.0)
NEUTROS ABS: 7 10*3/uL (ref 1.7–7.7)
NEUTROS PCT: 93 %
Platelets: 89 10*3/uL — ABNORMAL LOW (ref 150–400)
RBC: 2.8 MIL/uL — AB (ref 4.22–5.81)
RDW: 15.4 % (ref 11.5–15.5)
WBC: 7.5 10*3/uL (ref 4.0–10.5)

## 2017-03-08 LAB — RENAL FUNCTION PANEL
ANION GAP: 5 (ref 5–15)
Albumin: 2.1 g/dL — ABNORMAL LOW (ref 3.5–5.0)
BUN: 30 mg/dL — ABNORMAL HIGH (ref 6–20)
CALCIUM: 8.2 mg/dL — AB (ref 8.9–10.3)
CO2: 26 mmol/L (ref 22–32)
Chloride: 100 mmol/L — ABNORMAL LOW (ref 101–111)
Creatinine, Ser: 0.8 mg/dL (ref 0.61–1.24)
GFR calc non Af Amer: >60 mL/min (ref >60)
Glucose, Bld: 118 mg/dL — ABNORMAL HIGH (ref 65–99)
PHOSPHORUS: 2.1 mg/dL — AB (ref 2.5–4.6)
POTASSIUM: 3.7 mmol/L (ref 3.5–5.1)
SODIUM: 131 mmol/L — AB (ref 135–145)

## 2017-03-08 LAB — MAGNESIUM: Magnesium: 2 mg/dL (ref 1.7–2.4)

## 2017-03-08 MED ORDER — CEFAZOLIN SODIUM-DEXTROSE 2-4 GM/100ML-% IV SOLN
2.0000 g | Freq: Three times a day (TID) | INTRAVENOUS | Status: DC
  Administered 2017-03-08 – 2017-03-12 (×11): 2 g via INTRAVENOUS
  Filled 2017-03-08 (×13): qty 100

## 2017-03-08 MED ORDER — FUROSEMIDE 10 MG/ML IJ SOLN
20.0000 mg | Freq: Once | INTRAMUSCULAR | Status: AC
  Administered 2017-03-08: 20 mg via INTRAVENOUS
  Filled 2017-03-08: qty 2

## 2017-03-08 MED ORDER — SENNOSIDES 8.8 MG/5ML PO SYRP
5.0000 mL | ORAL_SOLUTION | Freq: Two times a day (BID) | ORAL | Status: DC
  Administered 2017-03-08 – 2017-03-11 (×7): 5 mL
  Filled 2017-03-08 (×8): qty 5

## 2017-03-08 NOTE — Plan of Care (Signed)
  Interdisciplinary Goals of Care Family Meeting   Date carried out:: 03/08/2017  Location of the meeting: Bedside  Member's involved: Physician, Bedside Registered Nurse and Family Member or next of kin  Durable Power of Attorney or acting medical decision maker: Nephew    Discussion: We discussed goals of care for [name].  Discussed patient's clinical progress with regards to his septic shock with nephew at bedside. Discussed the fact that he did wean for an extended period of time today and tolerated pressure support of 0 with 5 of PEEP for over 45 minutes. Explained that I am diuresing the patient gently. We did discuss the possibility of retropharyngeal edema and the process of checking a cuff leak to try and ensure this is not the case before extubation. He does realize this is not always able to be diagnosed beforehand it could result in a difficult airway after extubation, even possibly death. We will plan for spontaneous breathing trial tomorrow morning and checking a cuff leak. He would like to be here tomorrow around the time of extubation and we will plan for sometime around 10 AM.  Code status: Limited Code or DNR with short term  Disposition: Continue current acute care  Time spent for the meeting: 5 minutes   Lawanda CousinsJennings Donald Reynolds 03/08/2017, 7:49 PM

## 2017-03-08 NOTE — Progress Notes (Signed)
Pt placed back on full support by MD due to inc WOB, pt tolerating well, RT will monitor

## 2017-03-08 NOTE — Progress Notes (Signed)
Pharmacy Antibiotic Note  Donald BerlinWilliam E Reynolds is a 83 y.o. male admitted on 15-Apr-2017 with MSSA pneumonia. Patient is on day 5/14 of antibiotic therapy. WBC WNL and patient afebrile today. SCr improved to < 1. We will narrow patient to cefazolin today based on susceptibilities and cultures.   Plan: Narrow to Cefazolin 2 gm every 8 hours  Plan to complete a 14 day course Monitor renal function and clinical progress   Height: 6' (182.9 cm) Weight: 182 lb 12.2 oz (82.9 kg) IBW/kg (Calculated) : 77.6  Temp (24hrs), Avg:98.5 F (36.9 C), Min:98.2 F (36.8 C), Max:98.7 F (37.1 C)  Recent Labs  Lab 02/20/17 1551 02/20/17 1613 03/05/17 0317 03/05/17 1825 03/06/17 0414 03/07/17 0316 03/08/17 0248  WBC 7.6  --  2.5*  --  7.6 9.0 7.5  CREATININE 0.74  --  0.75  --  0.92 1.01 0.80  LATICACIDVEN  --  1.52 1.1  --   --   --   --   VANCORANDOM  --   --   --  11  --   --   --     Estimated Creatinine Clearance: 68.7 mL/min (by C-G formula based on SCr of 0.8 mg/dL).    No Known Allergies   11/19 Vanc >>11/23 11/19 Zosyn >>111/20 11/20 Fortaz >>11.23 11/20 Azith >>11/21 11/23 Cefazolin >>(12/2)  11/20 VL = 11 mcg/mL post 1g load and one 750mg  dose >> con't 750mg  q12  11/19 MRSA pcr negative, Flu negative 11/19 BCx - NGTD 11/19 RVP - negative 11/20 Legionella >>negative 11/20 TA - MSSA 11/21 UCx - No growth final   Della GooEmily S Sinclair, PharmD, BCPS PGY2 Infectious Diseases Pharmacy Resident Phone X [phone] 03/08/2017, 9:59 AM

## 2017-03-08 NOTE — Progress Notes (Signed)
[facility] Attending Note  ADMISSION DATE:02/25/2017  CONSULTATION DATE:03/09/2017  REFERRING QV:[id number] Eulis Foster, M.D. / EDP  CHIEF COMPLAINT:Sepsis  Presenting HPI:  83 y.o. male with known history of congestive heart failure, coronary artery disease, atrial fibrillation, seizure disorder, and baseline intellectual handicap. He does not have dementia. Patient presented from skilled nursing facility with increased confusion, anasarca, shock, and hypothermia. Clinical picture highly suspicious for sepsis. Patient was given empiric Candida biotics with vancomycin and Zosyn as well as bolus IV fluid. With worsening respiratory status he ultimately required endotracheal intubation after admission to the ICU. He also had central venous catheter placed for vasopressor infusion.  Subjective:  No acute events overnight. Patient weaning on pressure support 5/5. Denies any chest pain, difficulty breathing, or nausea.  Review of Systems:  Unable to obtain given intubation.   Vent Mode: PSV;CPAP FiO2 (%):  [40 %] 40 % Set Rate:  [12 bmp] 12 bmp Vt Set:  [470 mL] 470 mL PEEP:  [5 cmH20] 5 cmH20 Pressure Support:  [5 IEP32-9 cmH20] 5 cmH20 Plateau Pressure:  [11 cmH20-16 cmH20] 13 cmH20  Temp:  [98.2 F (36.8 C)-98.7 F (37.1 C)] 98.7 F (37.1 C) (11/23 0751) Pulse Rate:  [29-117] 93 (11/23 0839) Resp:  [15-27] 24 (11/23 0839) BP: (89-143)/(54-130) 143/130 (11/23 0800) SpO2:  [90 %-100 %] 100 % (11/23 0839) FiO2 (%):  [40 %] 40 % (11/23 0839) Weight:  [182 lb 12.2 oz (82.9 kg)] 182 lb 12.2 oz (82.9 kg) (11/23 0400)  Gen.: No family at bedside. Awake. No distress. Integument: Warm. Dry. Without rash. Cardiovascular: Regular rate. Anasarca. No JVD appreciated. Pulmonary: Distant breath sounds bilaterally. Symmetric chest wall expansion on ventilator. Normal work of breathing on pressure support 5/5. Abdomen: Soft. Nondistended. Normal bowel  sounds. Neurological: Nods to questions. Attends to voice. HEENT:  Moist membranes. Endotracheal tube in place.   LINES/TUBES: OETT 11/20 >>> L IJ CVL 11/20 >>> Foley 11/20 >>> OGT 11/20 >>> PIV  CBC Latest Ref Rng & Units 03/08/2017 03/07/2017 03/06/2017  WBC 4.0 - 10.5 K/uL 7.5 9.0 7.6  Hemoglobin 13.0 - 17.0 g/dL 8.9(L) 9.1(L) 9.6(L)  Hematocrit 39.0 - 52.0 % 25.4(L) 25.9(L) 27.4(L)  Platelets 150 - 400 K/uL 89(L) 102(L) 95(L)    BMP Latest Ref Rng & Units 03/08/2017 03/07/2017 03/06/2017  Glucose 65 - 99 mg/dL 118(H) 123(H) 99  BUN 6 - 20 mg/dL 30(H) 27(H) 22(H)  Creatinine 0.61 - 1.24 mg/dL 0.80 1.01 0.92  Sodium 135 - 145 mmol/L 131(L) 129(L) 130(L)  Potassium 3.5 - 5.1 mmol/L 3.7 4.3 4.2  Chloride 101 - 111 mmol/L 100(L) 99(L) 99(L)  CO2 22 - 32 mmol/L 26 25 26   Calcium 8.9 - 10.3 mg/dL 8.2(L) 8.6(L) 8.6(L)    Hepatic Function Latest Ref Rng & Units 03/08/2017 03/07/2017 03/06/2017  Total Protein 6.5 - 8.1 g/dL - - 4.7(L)  Albumin 3.5 - 5.0 g/dL 2.1(L) 2.2(L) 2.3(L)  AST 15 - 41 U/L - - 25  ALT 17 - 63 U/L - - 30  Alk Phosphatase 38 - 126 U/L - - 92  Total Bilirubin 0.3 - 1.2 mg/dL - - 1.0  Bilirubin, Direct 0.1 - 0.5 mg/dL - - 0.2    IMAGING/STUDIES: TTE 11/20:  LV normal in size with EF 50-55%. No regional wall motion abnormalities. Unable to assess diastolic function. LA & RA severely dilated. RV moderately dilated with preserved systolic function. Trivial aortic regurgitation without stenosis. Aortic root normal in size. Mild mitral regurgitation without stenosis.  No significant pulmonic regurgitation or stenosis with poorly visualized valve. Moderate tricuspid regurgitation. Small pericardial effusion circumferential to the heart. PORT CXR 11/20:  Previously reviewed by me. Endotracheal tube in good position. Enteric feeding tube coursing below diaphragm. Right-sided mid to lower lung predominant patchy opacification with alveolar filling. Cannot rule out  right-sided pleural effusion. Patchy left midlung alveolar opacity as well. Left internal jugular central venous catheter in good position. PORT CXR 11/22:  Personally reviewed by me. Left internal jugular central venous catheter in good position. Endotracheal tube in good position. Enteric feeding tube coursing below diaphragm. Film rotated slightly to the right. Persistent right lung patchy opacification with left lower lung opacification as well consistent with multifocal pneumonia. PORT ABD X-RAY 11/22:  Tip and side port of the enteric tube in the stomach. Air-filled prominent central small bowel suggests mild ileus. VENOUS DUPLEX 11/22:  No DVT or SVT.  MICROBIOLOGY: MRSA PCR 11/20:  Negative  Blood Cultures x2 11/19 >>> Tracheal Aspirate Culture 11/20:  MSSA Respiratory Panel PCR 11/19:  Negative  Influenza A/B PCR 11/19:  Negative  Urine Streptococcal Antigen 11/20:  Negative  Urine Legionella Antigen 11/20:  Negative  Urine Culture 11/21:  Negative   ANTIBIOTICS: Zosyn 11/19 - 11/20 Azithromycin 11/20 - 11/21 Vancomycin 11/19 - 11/23 Fortaz 11/20 - 11/23 Ancef 11/23 >>>  SIGNIFICANT EVENTS: 11/19 - Admit 11/20 - Worsening oxygenation >> intubated 11/21 - Improving oxygenation & decreasing pressor requirement. More awake.  11/23 - Tolerating PS 5/5 today. Off Levophed/Vasopressors.   ASSESSMENT/PLAN:  83 y.o. male admitted with septic shock secondary to MSSA pneumonia. Clinically improve. Weaned off vasopressor infusion this morning. Tolerating spontaneous breathing trial.  1. Septic shock: Shock has resolved. Secondary to MSSA pneumonia. Awaiting finalization blood cultures.  2. MSSA pneumonia: Transitioning from [place] and vancomycin to Ancef for a 14 day course. 3. Acute hypoxic respiratory failure: Secondary to pneumonia. Initiating gentle diuresis with Lasix 20 mg IV 1. Continuing spontaneous breathing trial with pressure support 0/5. 4. Thrombocytopenia: Stable.  Likely secondary to sepsis with splenic sequestration. Possible low level DIC. Trending cell counts daily with CBC.  5. Anemia: Hemoglobin stable. No signs of active bleeding. Trending cell counts daily with CBC. 6. Urinary retention: Holding Flomax. Known history of BPH. Continuing Foley catheter. 7. Hyponatremia: Stable. Continuing to monitor electrolytes daily. 8. Borderline hypoglycemia: Improved. Monitoring Accu-Cheks every 4 hours with M.D. notification parameters. 9. Bradycardia: Resolved. 10. Epilepsy: Continuing phenobarbital. Continuing seizure precautions & Versed IV as necessary for any seizure. 11. History of atrial fibrillation: Holding systemic anticoagulation. Monitoring patient on telemetry. 12. Chronic diastolic congestive heart failure: No signs of acute decompensation. Holding diuresis given shock. 13. Prolonged QTc: Resolved. 14. Hypomagnesemia: Resolved.  Prophylaxis:  Heparin [place] q8hr, SCDs, & Protonix via tube daily. Diet:  NPO. Continuing tube feedings per dietary recommendations. Code Status:  Limited Code - refer to Plan of Care Documentation. Disposition:  Remains critically ill with guarded prognosis. Family Update: No family at bedside during rounds today.  I have personally spent a total of 31 minutes of critical care time today caring for the patient & reviewing the patient's electronic medical record.  Sonia Baller Ashok Cordia, M.D. [facility] Pulmonary & Critical Care Pager:  [phone] After 7pm or if no response, call [phone] 9:50 AM 03/08/17

## 2017-03-09 ENCOUNTER — Inpatient Hospital Stay (HOSPITAL_COMMUNITY): Payer: Medicare HMO

## 2017-03-09 LAB — RENAL FUNCTION PANEL
ALBUMIN: 1.9 g/dL — AB (ref 3.5–5.0)
ANION GAP: 6 (ref 5–15)
BUN: 32 mg/dL — ABNORMAL HIGH (ref 6–20)
CALCIUM: 8.3 mg/dL — AB (ref 8.9–10.3)
CO2: 28 mmol/L (ref 22–32)
Chloride: 99 mmol/L — ABNORMAL LOW (ref 101–111)
Creatinine, Ser: 0.71 mg/dL (ref 0.61–1.24)
GFR calc Af Amer: >60 mL/min (ref >60)
GFR calc non Af Amer: >60 mL/min (ref >60)
Glucose, Bld: 99 mg/dL (ref 65–99)
PHOSPHORUS: 2 mg/dL — AB (ref 2.5–4.6)
Potassium: 3.8 mmol/L (ref 3.5–5.1)
SODIUM: 133 mmol/L — AB (ref 135–145)

## 2017-03-09 LAB — GLUCOSE, CAPILLARY
GLUCOSE-CAPILLARY: 116 mg/dL — AB (ref 65–99)
GLUCOSE-CAPILLARY: 86 mg/dL (ref 65–99)
Glucose-Capillary: 85 mg/dL (ref 65–99)
Glucose-Capillary: 95 mg/dL (ref 65–99)
Glucose-Capillary: 101 mg/dL — ABNORMAL HIGH (ref 65–99)
Glucose-Capillary: 129 mg/dL — ABNORMAL HIGH (ref 65–99)
Glucose-Capillary: 117 mg/dL — ABNORMAL HIGH (ref 65–99)

## 2017-03-09 LAB — CULTURE, BLOOD (ROUTINE X 2)
CULTURE: NO GROWTH
Culture: NO GROWTH

## 2017-03-09 LAB — CBC WITH DIFFERENTIAL/PLATELET
Basophils Absolute: 0 10*3/uL (ref 0.0–0.1)
Basophils Relative: 0 %
EOS ABS: 0 10*3/uL (ref 0.0–0.7)
Eosinophils Relative: 0 %
HCT: 24.9 % — ABNORMAL LOW (ref 39.0–52.0)
HEMOGLOBIN: 8.8 g/dL — AB (ref 13.0–17.0)
LYMPHS ABS: 0.3 10*3/uL — AB (ref 0.7–4.0)
Lymphocytes Relative: 6 %
MCH: 31.4 pg (ref 26.0–34.0)
MCHC: 35.3 g/dL (ref 30.0–36.0)
MCV: 88.9 fL (ref 78.0–100.0)
MONOS PCT: 7 %
Monocytes Absolute: 0.3 10*3/uL (ref 0.1–1.0)
NEUTROS PCT: 87 %
Neutro Abs: 3.8 10*3/uL (ref 1.7–7.7)
Platelets: 79 10*3/uL — ABNORMAL LOW (ref 150–400)
RBC: 2.8 MIL/uL — ABNORMAL LOW (ref 4.22–5.81)
RDW: 15.2 % (ref 11.5–15.5)
WBC: 4.3 10*3/uL (ref 4.0–10.5)

## 2017-03-09 LAB — MAGNESIUM: Magnesium: 2 mg/dL (ref 1.7–2.4)

## 2017-03-09 MED ORDER — DOCUSATE SODIUM 50 MG/5ML PO LIQD
100.0000 mg | Freq: Two times a day (BID) | ORAL | Status: DC
  Administered 2017-03-09 – 2017-03-11 (×5): 100 mg
  Filled 2017-03-09 (×5): qty 10

## 2017-03-09 MED ORDER — FUROSEMIDE 10 MG/ML IJ SOLN: 20.0000 mg | Freq: Four times a day (QID) | INTRAMUSCULAR | Status: DC

## 2017-03-09 MED ORDER — FUROSEMIDE 10 MG/ML IJ SOLN
20.0000 mg | Freq: Four times a day (QID) | INTRAMUSCULAR | Status: AC
  Administered 2017-03-09 (×3): 20 mg via INTRAVENOUS
  Filled 2017-03-09 (×4): qty 2

## 2017-03-09 MED ORDER — POTASSIUM PHOSPHATES 15 MMOLE/5ML IV SOLN
20.0000 mmol | Freq: Once | INTRAVENOUS | Status: AC
  Administered 2017-03-09: 20 mmol via INTRAVENOUS
  Filled 2017-03-09: qty 6.67

## 2017-03-09 NOTE — Progress Notes (Signed)
[facility] Attending Note  ADMISSION DATE:02/20/2017  CONSULTATION DATE:02/25/2017  REFERRING EX:[id number] Eulis Foster, M.D. / EDP  CHIEF COMPLAINT:Sepsis  Presenting HPI:  83 y.o. male with known history of congestive heart failure, coronary artery disease, atrial fibrillation, seizure disorder, and baseline intellectual handicap. He does not have dementia. Patient presented from skilled nursing facility with increased confusion, anasarca, shock, and hypothermia. Clinical picture highly suspicious for sepsis. Patient was given empiric Candida biotics with vancomycin and Zosyn as well as bolus IV fluid. With worsening respiratory status he ultimately required endotracheal intubation after admission to the ICU. He also had central venous catheter placed for vasopressor infusion.  Subjective:  No acute events overnight. Patient weaning on pressure support 5/5. Denies any chest pain, difficulty breathing, or nausea. Increasing endotracheal secretions.  Review of Systems:  Unable to obtain given intubation.   Vent Mode: PSV;CPAP FiO2 (%):  [40 %] 40 % Set Rate:  [12 bmp] 12 bmp Vt Set:  [470 mL-480 mL] 480 mL PEEP:  [5 cmH20] 5 cmH20 Pressure Support:  [0 cmH20-5 cmH20] 5 cmH20 Plateau Pressure:  [15 VEL38-10 cmH20] 18 cmH20  Temp:  [97.6 F (36.4 C)-98.1 F (36.7 C)] 97.8 F (36.6 C) (11/24 0807) Pulse Rate:  [56-150] 56 (11/24 0900) Resp:  [15-29] 25 (11/24 0900) BP: (78-101)/(37-71) 91/57 (11/24 0801) SpO2:  [90 %-100 %] 96 % (11/24 0900) FiO2 (%):  [40 %] 40 % (11/24 0900) Weight:  [185 lb 10 oz (84.2 kg)] 185 lb 10 oz (84.2 kg) (11/24 0500)  Gen.: Patient awake. No family at bedside. No distress. Integument: No rash. Warm. Dry. Cardiovascular: Regular rate. Anasarca. Unable to appreciate JVD. Pulmonary: Coarse breath sounds bilaterally with copious tan, thick secretions. Normal work of breathing on pressure support 5/5. Abdomen: Soft. Nondistended.  Normal bowel sounds. Neurological: Following commands. Nods to questions. Attends to voice. HEENT: Moist mucous membranes. No scleral icterus. Endotracheal tube in place.  LINES/TUBES: OETT 11/20 >>> L IJ CVL 11/20 >>> Foley 11/20 >>> OGT 11/20 >>> PIV  CBC Latest Ref Rng & Units 03/09/2017 03/08/2017 03/07/2017  WBC 4.0 - 10.5 K/uL 4.3 7.5 9.0  Hemoglobin 13.0 - 17.0 g/dL 8.8(L) 8.9(L) 9.1(L)  Hematocrit 39.0 - 52.0 % 24.9(L) 25.4(L) 25.9(L)  Platelets 150 - 400 K/uL 79(L) 89(L) 102(L)    BMP Latest Ref Rng & Units 03/09/2017 03/08/2017 03/07/2017  Glucose 65 - 99 mg/dL 99 118(H) 123(H)  BUN 6 - 20 mg/dL 32(H) 30(H) 27(H)  Creatinine 0.61 - 1.24 mg/dL 0.71 0.80 1.01  Sodium 135 - 145 mmol/L 133(L) 131(L) 129(L)  Potassium 3.5 - 5.1 mmol/L 3.8 3.7 4.3  Chloride 101 - 111 mmol/L 99(L) 100(L) 99(L)  CO2 22 - 32 mmol/L _0 Calcium 8.9 - 10.3 mg/dL 8.3(L) 8.2(L) 8.6(L)    Hepatic Function Latest Ref Rng & Units 03/09/2017 03/08/2017 03/07/2017  Total Protein 6.5 - 8.1 g/dL - - -  Albumin 3.5 - 5.0 g/dL 1.9(L) 2.1(L) 2.2(L)  AST 15 - 41 U/L - - -  ALT 17 - 63 U/L - - -  Alk Phosphatase 38 - 126 U/L - - -  Total Bilirubin 0.3 - 1.2 mg/dL - - -  Bilirubin, Direct 0.1 - 0.5 mg/dL - - -    IMAGING/STUDIES: TTE 11/20:  LV normal in size with EF 50-55%. No regional wall motion abnormalities. Unable to assess diastolic function. LA & RA severely dilated. RV moderately dilated with preserved systolic function. Trivial aortic regurgitation without stenosis. Aortic  root normal in size. Mild mitral regurgitation without stenosis. No significant pulmonic regurgitation or stenosis with poorly visualized valve. Moderate tricuspid regurgitation. Small pericardial effusion circumferential to the heart. PORT CXR 11/20:  Previously reviewed by me. Endotracheal tube in good position. Enteric feeding tube coursing below diaphragm. Right-sided mid to lower lung predominant patchy opacification  with alveolar filling. Cannot rule out right-sided pleural effusion. Patchy left midlung alveolar opacity as well. Left internal jugular central venous catheter in good position. PORT CXR 11/22:  Personally reviewed by me. Left internal jugular central venous catheter in good position. Endotracheal tube in good position. Enteric feeding tube coursing below diaphragm. Film rotated slightly to the right. Persistent right lung patchy opacification with left lower lung opacification as well consistent with multifocal pneumonia. PORT ABD X-RAY 11/22:  Tip and side port of the enteric tube in the stomach. Air-filled prominent central small bowel suggests mild ileus. VENOUS DUPLEX 11/22:  No DVT or SVT. PORT CXR 11/24 >>>  MICROBIOLOGY: MRSA PCR 11/20:  Negative  Blood Cultures x2 11/19 >>> Tracheal Aspirate Culture 11/20:  MSSA Respiratory Panel PCR 11/19:  Negative  Influenza A/B PCR 11/19:  Negative  Urine Streptococcal Antigen 11/20:  Negative  Urine Legionella Antigen 11/20:  Negative  Urine Culture 11/21:  Negative   ANTIBIOTICS: Zosyn 11/19 - 11/20 Azithromycin 11/20 - 11/21 Vancomycin 11/19 - 11/23 Fortaz 11/20 - 11/23 Ancef 11/23 >>>  SIGNIFICANT EVENTS: 11/19 - Admit 11/20 - Worsening oxygenation >> intubated 11/21 - Improving oxygenation & decreasing pressor requirement. More awake.  11/23 - Tolerating PS 5/5 today. Off Levophed/Vasopressors. Lasix IV x1 given 11/24 - Increasing tan secretions >> chest PT & more diuresis   ASSESSMENT/PLAN:  83 y.o. male admitted with septic shock secondary to MSSA pneumonia. Continuing to clinically improve. Increasing endotracheal secretions today which is concerning. Continuing pressure support wean. Deferring extubation given significant increase in secretions.  1. Septic shock: Shock has resolved. Secondary to MSSA pneumonia. Plan to reculture for fever. Awaiting finalization blood cultures. 2. MSSA pneumonia: Continuing Ancef & treating  for a total of 14 days with stop date in place. Repeating chest x-ray PA/LAT today. Starting chest PT for increased tracheal secretions. 3. Acute hypoxic respiratory failure: Initiating chest PT. Continuing pressure support wean. Lasix 20 mg IV every 6 hours 3 doses today.  4. Thrombocytopenia: Stable. Question DIC versus splenic sequestration with sepsis. Trending cell counts daily with CBC.  5. Anemia: Hemoglobin stable. No signs of active bleeding. Trending cell counts daily with CBC. 6. Urinary retention: Holding Flomax. Known history of BPH. Continuing Foley catheter. 7. Hyponatremia: Improving. Continuing to monitor electrolytes daily. 8. Hypophosphatemia: K-Phos 20 mmol IV given. Repeat phosphorus level with a.m. labs. 9. Borderline hypoglycemia: Improved. Monitoring Accu-Cheks every 4 hours with M.D. notification parameters. 10. Bradycardia: Resolved. 11. Epilepsy: Continuing phenobarbital. Continuing seizure precautions & Versed IV as necessary for any seizure. 12. History of atrial fibrillation: Holding systemic anticoagulation. Monitoring patient on telemetry. 13. Chronic diastolic congestive heart failure: No signs of acute decompensation. Holding diuresis given shock. 14. Prolonged QTc: Resolved. 15. Hypomagnesemia: Resolved.  Prophylaxis:  Heparin [place] q8hr, SCDs, & Protonix via tube daily. Diet:  NPO. Continuing tube feedings per dietary recommendations. Code Status:  Limited Code - refer to Plan of Care Documentation. Disposition:  Remains critically ill with guarded prognosis. Family Update: Nephew updated after rounds yesterday. No family at bedside this morning.  I have personally spent a total of 33 minutes of critical care time today caring for  the patient & reviewing the patient's electronic medical record.  Sonia Baller Ashok Cordia, M.D. [facility] Pulmonary & Critical Care Pager:  [phone] After 7pm or if no response, call [phone] 10:00 AM 03/09/17

## 2017-03-10 LAB — CBC WITH DIFFERENTIAL/PLATELET
Basophils Absolute: 0 10*3/uL (ref 0.0–0.1)
Basophils Relative: 0 %
EOS ABS: 0 10*3/uL (ref 0.0–0.7)
Eosinophils Relative: 0 %
HEMATOCRIT: 24.2 % — AB (ref 39.0–52.0)
HEMOGLOBIN: 8.7 g/dL — AB (ref 13.0–17.0)
LYMPHS ABS: 0.3 10*3/uL — AB (ref 0.7–4.0)
Lymphocytes Relative: 7 %
MCH: 31.8 pg (ref 26.0–34.0)
MCHC: 36 g/dL (ref 30.0–36.0)
MCV: 88.3 fL (ref 78.0–100.0)
Monocytes Absolute: 0.2 10*3/uL (ref 0.1–1.0)
Monocytes Relative: 4 %
NEUTROS ABS: 4.2 10*3/uL (ref 1.7–7.7)
NEUTROS PCT: 89 %
Platelets: 85 10*3/uL — ABNORMAL LOW (ref 150–400)
RBC: 2.74 MIL/uL — AB (ref 4.22–5.81)
RDW: 15 % (ref 11.5–15.5)
WBC: 4.7 10*3/uL (ref 4.0–10.5)

## 2017-03-10 LAB — RENAL FUNCTION PANEL
ANION GAP: 7 (ref 5–15)
ANION GAP: 7 (ref 5–15)
Albumin: 1.9 g/dL — ABNORMAL LOW (ref 3.5–5.0)
Albumin: 1.7 g/dL — ABNORMAL LOW (ref 3.5–5.0)
BUN: 35 mg/dL — ABNORMAL HIGH (ref 6–20)
BUN: 36 mg/dL — ABNORMAL HIGH (ref 6–20)
CALCIUM: 8.2 mg/dL — AB (ref 8.9–10.3)
CHLORIDE: 98 mmol/L — AB (ref 101–111)
CHLORIDE: 95 mmol/L — AB (ref 101–111)
CO2: 29 mmol/L (ref 22–32)
CO2: 30 mmol/L (ref 22–32)
Calcium: 8 mg/dL — ABNORMAL LOW (ref 8.9–10.3)
Creatinine, Ser: 0.81 mg/dL (ref 0.61–1.24)
Creatinine, Ser: 0.7 mg/dL (ref 0.61–1.24)
GFR calc Af Amer: >60 mL/min (ref >60)
GFR calc non Af Amer: >60 mL/min (ref >60)
GFR calc non Af Amer: >60 mL/min (ref >60)
GLUCOSE: 112 mg/dL — AB (ref 65–99)
GLUCOSE: 107 mg/dL — AB (ref 65–99)
POTASSIUM: 3.7 mmol/L (ref 3.5–5.1)
POTASSIUM: 3.9 mmol/L (ref 3.5–5.1)
Phosphorus: 2.1 mg/dL — ABNORMAL LOW (ref 2.5–4.6)
Phosphorus: 2.1 mg/dL — ABNORMAL LOW (ref 2.5–4.6)
SODIUM: 134 mmol/L — AB (ref 135–145)
Sodium: 132 mmol/L — ABNORMAL LOW (ref 135–145)

## 2017-03-10 LAB — GLUCOSE, CAPILLARY
GLUCOSE-CAPILLARY: 114 mg/dL — AB (ref 65–99)
GLUCOSE-CAPILLARY: 89 mg/dL (ref 65–99)
Glucose-Capillary: 121 mg/dL — ABNORMAL HIGH (ref 65–99)
Glucose-Capillary: 116 mg/dL — ABNORMAL HIGH (ref 65–99)
Glucose-Capillary: 119 mg/dL — ABNORMAL HIGH (ref 65–99)

## 2017-03-10 LAB — MAGNESIUM
Magnesium: 1.8 mg/dL (ref 1.7–2.4)
Magnesium: 2 mg/dL (ref 1.7–2.4)

## 2017-03-10 MED ORDER — FUROSEMIDE 10 MG/ML IJ SOLN
40.0000 mg | Freq: Four times a day (QID) | INTRAMUSCULAR | Status: AC
  Administered 2017-03-10 – 2017-03-11 (×3): 40 mg via INTRAVENOUS
  Filled 2017-03-10 (×3): qty 4

## 2017-03-10 NOTE — Progress Notes (Signed)
[facility] Attending Note  ADMISSION DATE:03/09/2017  CONSULTATION DATE:02/15/2017  REFERRING IW:[id number] Eulis Foster, M.D. / EDP  CHIEF COMPLAINT:Sepsis  Presenting HPI:  83 y.o. male with known history of congestive heart failure, coronary artery disease, atrial fibrillation, seizure disorder, and baseline intellectual handicap. He does not have dementia. Patient presented from skilled nursing facility with increased confusion, anasarca, shock, and hypothermia. Clinical picture highly suspicious for sepsis. Patient was given empiric Candida biotics with vancomycin and Zosyn as well as bolus IV fluid. With worsening respiratory status he ultimately required endotracheal intubation after admission to the ICU. He also had central venous catheter placed for vasopressor infusion.  Subjective:  Patient still with significant tracheal secretions overnight. Denies any pain or nausea. No acute events overnight.  Review of Systems:  Unable to obtain given intubation.   Vent Mode: PSV;CPAP FiO2 (%):  [40 %] 40 % Set Rate:  [12 bmp] 12 bmp Vt Set:  [480 mL] 480 mL PEEP:  [5 cmH20] 5 cmH20 Pressure Support:  [5 cmH20-10 cmH20] 5 cmH20 Plateau Pressure:  [12 cmH20-16 cmH20] 12 cmH20  Temp:  [97.5 F (36.4 C)-98.7 F (37.1 C)] 97.5 F (36.4 C) (11/25 1134) Pulse Rate:  [77-111] 89 (11/25 1123) Resp:  [18-28] 23 (11/25 1123) BP: (95-121)/(59-90) 107/66 (11/25 1123) SpO2:  [93 %-100 %] 97 % (11/25 1123) FiO2 (%):  [40 %] 40 % (11/25 1123)  Gen.: Awake. No distress. No family at bedside. Integument: No rash on exposed skin. Warm. Dry. Cardiovascular: Regular rate. Anasarca persists. Unable to appreciate JVD. Pulmonary: Coarse breath sounds bilaterally. Thick, tan tracheal secretions that are copious. Symmetric chest wall expansion on ventilator. Abdomen: Soft. Nondistended. Normoactive bowel sounds. HEENT: Moist mucus members. No scleral icterus. Endotracheal tube in  place. Neurological: Nods to questions. Attends to voice.  LINES/TUBES: OETT 11/20 >>> L IJ CVL 11/20 >>> Foley 11/20 >>> OGT 11/20 >>> PIV  CBC Latest Ref Rng & Units 03/10/2017 03/09/2017 03/08/2017  WBC 4.0 - 10.5 K/uL 4.7 4.3 7.5  Hemoglobin 13.0 - 17.0 g/dL 8.7(L) 8.8(L) 8.9(L)  Hematocrit 39.0 - 52.0 % 24.2(L) 24.9(L) 25.4(L)  Platelets 150 - 400 K/uL 85(L) 79(L) 89(L)    BMP Latest Ref Rng & Units 03/10/2017 03/09/2017 03/08/2017  Glucose 65 - 99 mg/dL 112(H) 99 118(H)  BUN 6 - 20 mg/dL 35(H) 32(H) 30(H)  Creatinine 0.61 - 1.24 mg/dL 0.81 0.71 0.80  Sodium 135 - 145 mmol/L 134(L) 133(L) 131(L)  Potassium 3.5 - 5.1 mmol/L 3.7 3.8 3.7  Chloride 101 - 111 mmol/L 98(L) 99(L) 100(L)  CO2 22 - 32 mmol/L 29 28 26   Calcium 8.9 - 10.3 mg/dL 8.2(L) 8.3(L) 8.2(L)    Hepatic Function Latest Ref Rng & Units 03/10/2017 03/09/2017 03/08/2017  Total Protein 6.5 - 8.1 g/dL - - -  Albumin 3.5 - 5.0 g/dL 1.9(L) 1.9(L) 2.1(L)  AST 15 - 41 U/L - - -  ALT 17 - 63 U/L - - -  Alk Phosphatase 38 - 126 U/L - - -  Total Bilirubin 0.3 - 1.2 mg/dL - - -  Bilirubin, Direct 0.1 - 0.5 mg/dL - - -    IMAGING/STUDIES: TTE 11/20:  LV normal in size with EF 50-55%. No regional wall motion abnormalities. Unable to assess diastolic function. LA & RA severely dilated. RV moderately dilated with preserved systolic function. Trivial aortic regurgitation without stenosis. Aortic root normal in size. Mild mitral regurgitation without stenosis. No significant pulmonic regurgitation or stenosis with poorly visualized valve. Moderate tricuspid  regurgitation. Small pericardial effusion circumferential to the heart. PORT CXR 11/20:  Previously reviewed by me. Endotracheal tube in good position. Enteric feeding tube coursing below diaphragm. Right-sided mid to lower lung predominant patchy opacification with alveolar filling. Cannot rule out right-sided pleural effusion. Patchy left midlung alveolar opacity as well.  Left internal jugular central venous catheter in good position. PORT CXR 11/22:  Personally reviewed by me. Left internal jugular central venous catheter in good position. Endotracheal tube in good position. Enteric feeding tube coursing below diaphragm. Film rotated slightly to the right. Persistent right lung patchy opacification with left lower lung opacification as well consistent with multifocal pneumonia. PORT ABD X-RAY 11/22:  Tip and side port of the enteric tube in the stomach. Air-filled prominent central small bowel suggests mild ileus. VENOUS DUPLEX 11/22:  No DVT or SVT. PORT CXR 11/24:  Personally reviewed by me. Left internal jugular central venous catheter in good position. Enteric feeding tube coursing below diaphragm. Endotracheal tube in good position. Persistent diffuse right lung patchy opacification. Possible left pleural effusion. Patchy left lung alveolar opacities as well.  MICROBIOLOGY: MRSA PCR 11/20:  Negative  Blood Cultures x2 11/19 >>> Tracheal Aspirate Culture 11/20:  MSSA Respiratory Panel PCR 11/19:  Negative  Influenza A/B PCR 11/19:  Negative  Urine Streptococcal Antigen 11/20:  Negative  Urine Legionella Antigen 11/20:  Negative  Urine Culture 11/21:  Negative  Tracheal Aspirate Culture 11/25 >>>  ANTIBIOTICS: Zosyn 11/19 - 11/20 Azithromycin 11/20 - 11/21 Vancomycin 11/19 - 11/23 Fortaz 11/20 - 11/23 Ancef 11/23 >>>  SIGNIFICANT EVENTS: 11/19 - Admit 11/20 - Worsening oxygenation >> intubated 11/21 - Improving oxygenation & decreasing pressor requirement. More awake.  11/23 - Tolerating PS 5/5 today. Off Levophed/Vasopressors. Lasix IV x1 given 11/24 - Increasing tan secretions >> chest PT & more diuresis  11/25 - Still high volume secretions >> more diuresis & checking trach aspirate culture  ASSESSMENT/PLAN:  83 y.o. male with septic shock secondary to MSSA pneumonia. Has increasing endotracheal secretions concerning for ongoing infectious  process versus volume overload with pulmonary edema. Tracheal secretions are a barrier to extubation at this time.  1. Septic shock: Shock has resolved. Secondary to MSSA pneumonia. Repeating cultures as above. 2. MSSA pneumonia: Repeating tracheal aspirate culture given increased secretions. Continuing 14 day course of treatment with Ancef given sensitivities. Continuing chest PT and guaifenesin for airway clearance. 3. Acute hypoxic respiratory failure: Continuing chest PT and diuresis with Lasix IV. Continuing daily spontaneous breathing trial and pressure support wean. 4. Thrombocytopenia: Stable. Question DIC versus splenic sequestration with sepsis. Trending cell counts daily with CBC.  5. Anemia: Hemoglobin stable. No signs of active bleeding. Trending cell counts daily with CBC. 6. Urinary retention: Holding Flomax. Known history of BPH. Continuing Foley catheter. 7. Hyponatremia: Steadily improving. Monitor electrolytes daily. 8. Hypophosphatemia: Mild. Monitoring serum phosphorus level daily. 9. Borderline hypoglycemia: Improved. Monitoring Accu-Cheks every 4 hours with M.D. notification parameters. 10. Bradycardia: Resolved. 11. Epilepsy: Continuing phenobarbital. Continuing seizure precautions & Versed IV as necessary for any seizure. 12. History of atrial fibrillation: Holding systemic anticoagulation. Monitoring patient on telemetry. 13. Chronic diastolic congestive heart failure: No signs of acute decompensation. Holding diuresis given shock. 14. Prolonged QTc: Resolved. 15. Hypomagnesemia: Resolved.  Prophylaxis:  Heparin [place] q8hr, SCDs, & Protonix via tube daily. Diet:  NPO. Continuing tube feedings per dietary recommendations. Code Status:  Limited Code - refer to Plan of Care Documentation. Disposition:  Remains critically ill with guarded prognosis. Family Update: No  family at bedside today.   I have personally spent a total of 32 minutes of critical care time today caring  for the patient & reviewing the patient's electronic medical record.  Sonia Baller Ashok Cordia, M.D. [facility] Pulmonary & Critical Care Pager:  [phone] After 7pm or if no response, call [phone] 2:06 PM 03/10/17

## 2017-03-10 NOTE — Procedures (Signed)
Pt placed back on full support at this time due to increased agitation, RR >45, increased WOB, pt tolerating full support well, RT will monitor

## 2017-03-11 LAB — GLUCOSE, CAPILLARY
GLUCOSE-CAPILLARY: 100 mg/dL — AB (ref 65–99)
GLUCOSE-CAPILLARY: 103 mg/dL — AB (ref 65–99)
GLUCOSE-CAPILLARY: 115 mg/dL — AB (ref 65–99)
GLUCOSE-CAPILLARY: 116 mg/dL — AB (ref 65–99)
GLUCOSE-CAPILLARY: 84 mg/dL (ref 65–99)
Glucose-Capillary: 116 mg/dL — ABNORMAL HIGH (ref 65–99)
Glucose-Capillary: 82 mg/dL (ref 65–99)

## 2017-03-11 LAB — RENAL FUNCTION PANEL
ANION GAP: 6 (ref 5–15)
Albumin: 1.8 g/dL — ABNORMAL LOW (ref 3.5–5.0)
BUN: 41 mg/dL — ABNORMAL HIGH (ref 6–20)
CHLORIDE: 96 mmol/L — AB (ref 101–111)
CO2: 33 mmol/L — AB (ref 22–32)
Calcium: 8.1 mg/dL — ABNORMAL LOW (ref 8.9–10.3)
Creatinine, Ser: 0.83 mg/dL (ref 0.61–1.24)
GFR calc non Af Amer: >60 mL/min (ref >60)
GLUCOSE: 117 mg/dL — AB (ref 65–99)
POTASSIUM: 3.9 mmol/L (ref 3.5–5.1)
Phosphorus: 2.1 mg/dL — ABNORMAL LOW (ref 2.5–4.6)
SODIUM: 135 mmol/L (ref 135–145)

## 2017-03-11 LAB — CBC WITH DIFFERENTIAL/PLATELET
BASOS ABS: 0 10*3/uL (ref 0.0–0.1)
BASOS PCT: 0 %
EOS ABS: 0 10*3/uL (ref 0.0–0.7)
Eosinophils Relative: 0 %
HEMATOCRIT: 23.6 % — AB (ref 39.0–52.0)
Hemoglobin: 8.3 g/dL — ABNORMAL LOW (ref 13.0–17.0)
Lymphocytes Relative: 5 %
Lymphs Abs: 0.3 10*3/uL — ABNORMAL LOW (ref 0.7–4.0)
MCH: 31.4 pg (ref 26.0–34.0)
MCHC: 35.2 g/dL (ref 30.0–36.0)
MCV: 89.4 fL (ref 78.0–100.0)
MONO ABS: 0.3 10*3/uL (ref 0.1–1.0)
Monocytes Relative: 5 %
NEUTROS ABS: 4.6 10*3/uL (ref 1.7–7.7)
Neutrophils Relative %: 90 %
PLATELETS: 107 10*3/uL — AB (ref 150–400)
RBC: 2.64 MIL/uL — ABNORMAL LOW (ref 4.22–5.81)
RDW: 15.1 % (ref 11.5–15.5)
WBC: 5.1 10*3/uL (ref 4.0–10.5)

## 2017-03-11 LAB — MAGNESIUM: Magnesium: 1.9 mg/dL (ref 1.7–2.4)

## 2017-03-11 MED ORDER — SCOPOLAMINE 1 MG/3DAYS TD PT72
1.0000 | MEDICATED_PATCH | TRANSDERMAL | Status: DC
  Administered 2017-03-11: 1.5 mg via TRANSDERMAL
  Filled 2017-03-11: qty 1

## 2017-03-11 MED ORDER — CHLORHEXIDINE GLUCONATE CLOTH 2 % EX PADS: 6.0000 | MEDICATED_PAD | Freq: Every day | CUTANEOUS | Status: DC

## 2017-03-11 MED ORDER — SODIUM CHLORIDE 0.9% FLUSH: 10.0000 mL | INTRAVENOUS | Status: DC | PRN

## 2017-03-11 MED ORDER — SODIUM GLYCEROPHOSPHATE 1 MMOLE/ML IV SOLN
20.0000 mmol | Freq: Once | INTRAVENOUS | Status: AC
  Administered 2017-03-11: 20 mmol via INTRAVENOUS
  Filled 2017-03-11: qty 20

## 2017-03-11 MED ORDER — ORAL CARE MOUTH RINSE
15.0000 mL | Freq: Two times a day (BID) | OROMUCOSAL | Status: DC
  Administered 2017-03-11: 15 mL via OROMUCOSAL

## 2017-03-11 MED ORDER — MORPHINE SULFATE (PF) 2 MG/ML IV SOLN
2.0000 mg | INTRAVENOUS | Status: DC | PRN
Start: 2017-03-11 — End: 2017-03-12

## 2017-03-11 MED ORDER — PHENOBARBITAL SODIUM 130 MG/ML IJ SOLN
100.0000 mg | Freq: Every day | INTRAMUSCULAR | Status: DC
  Administered 2017-03-11: 100 mg via INTRAVENOUS
  Filled 2017-03-11: qty 1

## 2017-03-11 NOTE — Progress Notes (Signed)
Patient was in his bed being helped by his charge nurse. No family member present. Nephew had been present and asked for prayer but left as chaplain was attending to the actively dying patient's husband. Chaplain continued to talk to charge nurse who was attending to patient. Patient not able to talk due to his medical condition and only whispered un- understandable words. Chaplain provided compassionate presence.  Chaplain M. Holley.

## 2017-03-11 NOTE — Progress Notes (Signed)
eLink Physician-Brief Progress Note Patient Name: Geanie BerlinWilliam E Cossin DOB: January 17, 1926 MRN: [id number]   Date of Service  03/11/2017  HPI/Events of Note  Contacted by bedside nurse regarding nothing by mouth status. Patient extubated today. Still has significant secretions. Nurse feels patient could not swallow medication safely. Currently has multiple oral medications ordered.   eICU Interventions  1. Discontinuing unnecessary oral medications 2. Contacted pharmacy for assistance in transitioning from oral phenobarbital to IV formulation      Intervention Category Intermediate Interventions: Other:  Lawanda CousinsJennings Taylon Coole 03/11/2017, 7:37 PM

## 2017-03-11 NOTE — Progress Notes (Signed)
Pharmacy Antibiotic Note  Donald Reynolds is a 83 y.o. male with a PMH of CAD, CHF, DVT hx, Afib, HTN, dementia, and seizure disorder. Patient was admitted on 03/06/2017 with MSSA pneumonia/sepsis to continue on cefazolin. Patient's WBC is WNL and has been trending down, afebrile. Patient's SCr remains stable at 0.83 and has had good urine output after 3 doses of Furosemide 40mg .   Plan: Continue Cefazolin 2g IV Q8H Monitor WBC, Temp, SCr, UOP, CXR Pharmacy will sign off and follow peripherally  Height: 6' (182.9 cm) Weight: 183 lb 3.2 oz (83.1 kg) IBW/kg (Calculated) : 77.6  Temp (24hrs), Avg:98.6 F (37 C), Min:97.8 F (36.6 C), Max:99.3 F (37.4 C)  Recent Labs  Lab 02/28/2017 1613 03/05/17 0317 03/05/17 1825  03/07/17 0316 03/08/17 0248 03/09/17 0257 03/10/17 0345 03/10/17 1800 03/11/17 0414 03/11/17 0530  WBC  --  2.5*  --    < > 9.0 7.5 4.3 4.7  --  5.1  --   CREATININE  --  0.75  --    < > 1.01 0.80 0.71 0.81 0.70  --  0.83  LATICACIDVEN 1.52 1.1  --   --   --   --   --   --   --   --   --   VANCORANDOM  --   --  11  --   --   --   --   --   --   --   --    < > = values in this interval not displayed.    Estimated Creatinine Clearance: 66.2 mL/min (by C-G formula based on SCr of 0.83 mg/dL).    No Known Allergies   11/19 Vanc >> 11/23 11/19 Zosyn >>11/20 11/20 Fortaz >> 11/23 11/20 Azith >> 11/21 11/23 Cefazolin >>  11/20 VL = 11 mcg/mL post 1g load and one 750mg  dose >> con't 750mg  q12  11/19 MRSA pcr negative, Flu negative 11/19 BCx - NG Final 11/19 RVP - negative 11/20 Legionella - negative 11/20 Strep pneumo - negative 11/20 TA Cx - Few Candida Albicans, Few Staph aureus (MSSA, R to Cipro, Erythromycin) 11/21 UCx - NG Final 11/25 Repeat TA Cx - Cx reincubated for better growth, Pending  Donald LopesSonya Reynolds PharmD Candidate 03/11/2017, 2:37 PM

## 2017-03-11 NOTE — Procedures (Signed)
Extubation Procedure Note  Patient Details:   Name: Donald BerlinWilliam E Reynolds DOB: [birth year] MRN: [id number]   Airway Documentation:     Evaluation  O2 sats: stable throughout Complications: No apparent complications Patient did tolerate procedure well. Bilateral Breath Sounds: Rhonchi, Diminished   Pt extubated at this time per MD order, placed on 2L [place] tolerating well.   Donald Reynolds, Donald Reynolds 03/11/2017, 3:09 PM

## 2017-03-11 NOTE — Progress Notes (Signed)
PULMONARY / CRITICAL CARE MEDICINE   Name: Donald Reynolds MRN: [id number] DOB: 1925/10/08    ADMISSION DATE:  03/12/2017 CONSULTATION DATE: 03/13/2017  REFERRING MD: Mancel BaleElliott Wentz, MD  CHIEF COMPLAINT: Sepsis  HISTORY OF PRESENT ILLNESS:   83 y.o. male with known history of congestive heart failure, coronary artery disease, atrial fibrillation, seizure disorder, and baseline intellectual handicap. He does not have dementia. Patient presented from skilled nursing facility with increased confusion, anasarca, shock, and hypothermia. Clinical picture highly suspicious for sepsis. Patient was given empiric Candida biotics with vancomycin and Zosyn as well as bolus IV fluid. With worsening respiratory status he ultimately required endotracheal intubation after admission to the ICU. He also had central venous catheter placed for vasopressor infusion.  PAST MEDICAL HISTORY :  He  has a past medical history of Acute renal failure (HCC), Anemia, Arthritis, Atrial fibrillation (HCC), CHF (congestive heart failure) (HCC), Coronary artery disease, Deep venous thrombosis (HCC), Dementia, Diarrhea, Enlarged prostate, Heart murmur, Hiatal hernia (2003), History of seizure disorder, Hypertension, Hypokalemia, Nausea & vomiting, Seizures (HCC), and Vitiligo.  PAST SURGICAL HISTORY: He  has a past surgical history that includes Hernia repair.  No Known Allergies  No current facility-administered medications on file prior to encounter.    Current Outpatient Medications on File Prior to Encounter  Medication Sig  . Cholecalciferol (VITAMIN D) 2000 units CAPS Take 2,000 Units daily by mouth.   . dabigatran (PRADAXA) 150 MG CAPS capsule Take 1 capsule (150 mg total) by mouth every 12 (twelve) hours.  . fesoterodine (TOVIAZ) 4 MG TB24 tablet Take 4 mg by mouth daily.   . iron polysaccharides (NIFEREX) 150 MG capsule Take 150 mg by mouth daily.  Marland Kitchen. PHENobarbital (LUMINAL) 97.2 MG tablet Take 97.2 mg by mouth  daily.   . tamsulosin (FLOMAX) 0.4 MG CAPS capsule Take 0.4 mg by mouth daily.   . vitamin B-12 (CYANOCOBALAMIN) 500 MCG tablet Take 500 mcg by mouth daily.     FAMILY HISTORY:  His indicated that his mother is deceased. He indicated that his father is deceased. He indicated that his brother is deceased. He indicated that his maternal grandmother is deceased. He indicated that his maternal grandfather is deceased. He indicated that his paternal grandmother is deceased. He indicated that his paternal grandfather is deceased. He indicated that the status of his neg hx is unknown. He indicated that the status of his other is unknown.   SOCIAL HISTORY: He  reports that he has quit smoking. he has never used smokeless tobacco. He reports that he does not drink alcohol or use drugs.  REVIEW OF SYSTEMS:   Limited due to sedation and intubation. Denies pain, shortness of breath, or nausea.   SUBJECTIVE:  Afebrile overnight, hypotensive 80s/50s off pressors.  Remains agitated requiring return to full vent support yesterday.  VITAL SIGNS: BP (!) 89/53   Pulse 82   Temp 99.3 F (37.4 C) (Oral)   Resp (!) 21   Ht 6' (1.829 m)   Wt 183 lb 3.2 oz (83.1 kg)   SpO2 96%   BMI 24.85 kg/m   HEMODYNAMICS:    VENTILATOR SETTINGS: Vent Mode: PRVC FiO2 (%):  [40 %-60 %] 40 % Set Rate:  [12 bmp] 12 bmp Vt Set:  [480 mL] 480 mL PEEP:  [5 cmH20] 5 cmH20 Pressure Support:  [5 cmH20] 5 cmH20 Plateau Pressure:  [10 cmH20-17 cmH20] 10 cmH20  INTAKE / OUTPUT: I/O last 3 completed shifts: In: 2695 [I.V.:360; NG/GT:2035; IV Piggyback:300]  Out: 5425 [Urine:5425]  UOP: 3.85 L last 24 hours  PHYSICAL EXAMINATION: General: elderly male intubated and laying in bed, NAD HEENT: normocephalic, atraumatic, moist mucous membranes Neck: supple, non-tender without lymphadenopathy Cardiovascular: regular rate and rhythm without murmurs, rubs, or gallops Lungs: diffuse rhonchi and coarse breath sounds  bilaterally without wheeze or rales with normal work of breathing on ventilator Abdomen: soft, non-tender, non-distended, normoactive bowel sounds Skin: warm, dry, no rashes or lesions, cap refill < 2 seconds Extremities: warm and well perfused, normal tone, chronic 1+ edema feet bilaterally Neuro: following commands, grasp reflex intact bilaterally and able to moves toes bilaterallly  LABS:  BMET Recent Labs  Lab 03/10/17 0345 03/10/17 1800 03/11/17 0530  NA 134* 132* 135  K 3.7 3.9 3.9  CL 98* 95* 96*  CO2 29 30 33*  BUN 35* 36* 41*  CREATININE 0.81 0.70 0.83  GLUCOSE 112* 107* 117*    Electrolytes Recent Labs  Lab 03/10/17 0345 03/10/17 1800 03/11/17 0530  CALCIUM 8.2* 8.0* 8.1*  MG 1.8 2.0 1.9  PHOS 2.1* 2.1* 2.1*    CBC Recent Labs  Lab 03/09/17 0257 03/10/17 0345 03/11/17 0414  WBC 4.3 4.7 5.1  HGB 8.8* 8.7* 8.3*  HCT 24.9* 24.2* 23.6*  PLT 79* 85* 107*    Coag's Recent Labs  Lab 03/05/17 0932  APTT 104*  INR 1.72    Sepsis Markers Recent Labs  Lab 03/02/2017 1551 03/09/2017 1613 03/05/17 0317 03/06/17 0414  LATICACIDVEN  --  1.52 1.1  --   PROCALCITON 0.65  --  0.24 1.32    ABG Recent Labs  Lab 03/05/17 0011 03/05/17 0200 03/05/17 0529  PHART 7.421 7.430 7.391  PCO2ART 38.3 36.7 38.0  PO2ART 60.0* 49.6* 50.0*    Liver Enzymes Recent Labs  Lab 02/22/2017 1551 03/06/17 0414  03/10/17 0345 03/10/17 1800 03/11/17 0530  AST 45* 25  --   --   --   --   ALT 40 30  --   --   --   --   ALKPHOS 110 92  --   --   --   --   BILITOT 0.6 1.0  --   --   --   --   ALBUMIN 2.8* 2.3*  2.3*   < > 1.9* 1.7* 1.8*   < > = values in this interval not displayed.    Cardiac Enzymes No results for input(s): TROPONINI, PROBNP in the last 168 hours.  Glucose Recent Labs  Lab 03/10/17 0807 03/10/17 1135 03/10/17 1659 03/10/17 1951 03/11/17 0013 03/11/17 0358  GLUCAP 121* 116* 119* 89 103* 115*    Imaging No results found.   STUDIES:   PORTABLE CHEST 1 VIEW (03/09/17): Worsening multilobar pneumonia, small to moderate left pleural effusions, mild cardiomegaly PORTABLE ABDOMEN - 1 VIEW (03/07/17): Mild ileus, NG tube in stomach PORTABLE CHEST 1 VIEW (03/07/17): Extensive bilateral airspace disease right greater than left and small bilateral effusions unchanged PORTABLE ABDOMEN - 1 VIEW  (03/06/17): NG tube in stomach PORTABLE CHEST 1 VIEW (03/05/17): Concerning for pneumonia vs asymmetrical pulmonary edema, moderate size right pleural effusion layering posteriorly, left lower lobe atelectasis  CULTURES: Respiratory culture (03/10/17): Moderate WBCs, predominantly PMNs, rare squames, no organisms Blood culture (02/19/2017): Negative, final Respiratory culture (03/05/17): Abundant WBC, predominantly PMNs, rare squames, few GPC in chains, few yeast with pseudohyphae, culture few staph aureus and Candida albicans Urine culture (03/06/17): Negative, final  RVP PCR (03/13/2017): Negative MRSA PCR (03/05/17): Negative  ANTIBIOTICS: Cefazolin11/23 >>  Ceftazidime 11/20-11/23 Azithromycin 11/20-11/21 Zosyn 11/19-11/20 Vancomycin 11/19-11/23  SIGNIFICANT EVENTS:   LINES/TUBES:   DISCUSSION: Patient is an 83 year old male p/w acute hypoxic respiratory failure due to MSSA [place] requiring intubation.  ASSESSMENT / PLAN:  PULMONARY Acute hypoxic respiratory failure: Likely due to septic MSSA pneumonia - Continue full vent support, wean as tolerated - Continuing chest PT  CARDIOVASCULAR Chronic diastolic heart failure: No signs of acute decompensation Bradycardia: Resolved  H/O atrial fibrillation Prolonged QTC: Resolved - Telemetry - Holding systemic anticoagulation and diuresis  RENAL Hyponatremia: Resolved Hypophosphatemia: Persistent though stable Hypomagnesemia: Resolved  - Daily renal function panel and magnesium level - KVO  GASTROINTESTINAL Nutrition - NPO, continuing TF per dietary  recommendations - Protonix via tube daily - Colace per tube twice daily, Senokot twice daily  HEMATOLOGIC Thrombocytopenia: Improving, suspected splenic sequestration with sepsis versus questionable DIC Anemia: Hemoglobin stable, no signs of active bleeding - Daily CBC - SCDs and heparin SQ for prophylaxis  INFECTIOUS Septic shock: Secondary to MSSA pneumonia MSSA pneumonia Mucus secretions - Continuing cefazolin for 14-day course of treatment given sensitivities - Continue chest PT and guaifenesin  ENDOCRINE Borderline hypoglycemia: Resolved - CBG monitoring Q4H  NEUROLOGIC Agitation and sedation on ventilator Urinary retention: Known history of BPH Epilepsy - RASS goal: 0 to -1 - Holding home Flomax, doing Foley catheter - Continuing phenobarbital - Seizure precautions - Versed IV PRN seizure   FAMILY  - Updates: No family at bedside this morning - Inter-disciplinary family meet or Palliative Care meeting due by: 03/11/2017   Durward Parcelavid Lankford Gutzmer, DO [place]

## 2017-03-12 DIAGNOSIS — Z515 Encounter for palliative care: Secondary | ICD-10-CM

## 2017-03-12 LAB — GLUCOSE, CAPILLARY
Glucose-Capillary: 81 mg/dL (ref 65–99)
Glucose-Capillary: 73 mg/dL (ref 65–99)

## 2017-03-12 MED ORDER — ACETAMINOPHEN 325 MG PO TABS: 650.0000 mg | ORAL_TABLET | Freq: Four times a day (QID) | ORAL | Status: DC | PRN

## 2017-03-12 MED ORDER — GLYCOPYRROLATE 0.2 MG/ML IJ SOLN: 0.2000 mg | INTRAMUSCULAR | Status: DC | PRN

## 2017-03-12 MED ORDER — MORPHINE SULFATE (PF) 4 MG/ML IV SOLN: 2.0000 mg | INTRAVENOUS | Status: DC | PRN

## 2017-03-12 MED ORDER — LORAZEPAM 2 MG/ML IJ SOLN: 1.0000 mg | INTRAMUSCULAR | Status: DC | PRN

## 2017-03-12 MED ORDER — ACETAMINOPHEN 650 MG RE SUPP
650.0000 mg | Freq: Four times a day (QID) | RECTAL | Status: DC | PRN
Start: 2017-03-12 — End: 2017-03-14

## 2017-03-12 MED ORDER — HALOPERIDOL LACTATE 2 MG/ML PO CONC
0.5000 mg | ORAL | Status: DC | PRN
  Filled 2017-03-12: qty 0.3

## 2017-03-12 MED ORDER — SODIUM CHLORIDE 0.9 % IV SOLN: 250.0000 mL | INTRAVENOUS | Status: DC | PRN

## 2017-03-12 MED ORDER — POLYVINYL ALCOHOL 1.4 % OP SOLN: 1.0000 [drp] | Freq: Four times a day (QID) | OPHTHALMIC | Status: DC | PRN

## 2017-03-12 MED ORDER — BIOTENE DRY MOUTH MT LIQD: 15.0000 mL | OROMUCOSAL | Status: DC | PRN

## 2017-03-12 MED ORDER — HALOPERIDOL LACTATE 5 MG/ML IJ SOLN: 0.5000 mg | INTRAMUSCULAR | Status: DC | PRN

## 2017-03-12 MED ORDER — GLYCOPYRROLATE 1 MG PO TABS
1.0000 mg | ORAL_TABLET | ORAL | Status: DC | PRN
  Filled 2017-03-12: qty 1

## 2017-03-12 MED ORDER — SODIUM CHLORIDE 0.9 % IV SOLN
1.0000 mg/h | INTRAVENOUS | Status: DC
  Administered 2017-03-12: 1 mg/h via INTRAVENOUS
  Filled 2017-03-12: qty 10

## 2017-03-12 MED ORDER — LORAZEPAM 2 MG/ML PO CONC: 1.0000 mg | ORAL | Status: DC | PRN

## 2017-03-12 MED ORDER — MORPHINE BOLUS VIA INFUSION
2.0000 mg | INTRAVENOUS | Status: DC | PRN
  Administered 2017-03-13 (×2): 2 mg via INTRAVENOUS
  Filled 2017-03-12: qty 4

## 2017-03-12 MED ORDER — SODIUM CHLORIDE 0.9% FLUSH: 3.0000 mL | INTRAVENOUS | Status: DC | PRN

## 2017-03-12 MED ORDER — HALOPERIDOL 1 MG PO TABS: 0.5000 mg | ORAL_TABLET | ORAL | Status: DC | PRN

## 2017-03-12 MED ORDER — SODIUM CHLORIDE 0.9% FLUSH
3.0000 mL | Freq: Two times a day (BID) | INTRAVENOUS | Status: DC
  Administered 2017-03-12 – 2017-03-14 (×2): 3 mL via INTRAVENOUS

## 2017-03-12 MED ORDER — LORAZEPAM 1 MG PO TABS: 1.0000 mg | ORAL_TABLET | ORAL | Status: DC | PRN

## 2017-03-12 NOTE — Progress Notes (Signed)
CPT held. Patient sleeping. 

## 2017-03-12 NOTE — Progress Notes (Signed)
Nutrition Follow-up  DOCUMENTATION CODES:   Not applicable  INTERVENTION:    No nutrition interventions indicated at this time  NUTRITION DIAGNOSIS:   Inadequate oral intake related to inability to eat as evidenced by NPO status.  Ongoing  GOAL:   Patient will meet greater than or equal to 90% of their needs  Unmet  MONITOR:   Diet advancement, PO intake, Other (Comment)(overall goals of care)  REASON FOR ASSESSMENT:   Ventilator, Consult Enteral/tube feeding initiation and management  ASSESSMENT:   83 yo male with PMH of HTN, CAD, seizure D/O, A fib, CHF, baseline intellectual handicap, who was admitted from SNF on 11/19 with septic shock d/t pneumonia, anasarca. Required intubation on 11/20.  Patient was extubated on 11/26. Noted poor prognosis.  Palliative care team has been consulted for goals of care.  Hospital death expected.  Diet Order:  NPO  EDUCATION NEEDS:   No education needs have been identified at this time  Skin:  Skin Assessment: Reviewed RN Assessment Skin Integrity Issues:: Other (Comment) Other: MASD to buttocks  Last BM:  11/26  Height:   Ht Readings from Last 1 Encounters:  03/09/17 6' (1.829 m)    Weight:   Wt Readings from Last 1 Encounters:  03/12/17 181 lb 14.1 oz (82.5 kg)    Ideal Body Weight:  80.9 kg  BMI:  Body mass index is 24.67 kg/m.   Joaquin CourtsKimberly Harris, RD, LDN, CNSC Pager [phone] After Hours Pager [phone]

## 2017-03-12 NOTE — Progress Notes (Signed)
Patient seen by palliative care. Patient started on Morphine drip repositioned for comfort. Spoke with Minerva AreolaEric with update on patient condition

## 2017-03-12 NOTE — Progress Notes (Signed)
CSW acknowledges consult for pt. CSW reached out to the number provided for Minerva Areolaric (pt's nephew) and was informed pt's nephew has called to check on pt and was awiting another call. Minerva Areolaric informed CSW that he was on the way to the hospital to be with pt.      Claude MangesKierra S. Asusena Sigley, MSW, LCSW-A Emergency Department Clinical Social Worker [phone]

## 2017-03-12 NOTE — Progress Notes (Signed)
I was able to speak with the nephew Donald Reynolds over the phone and updated him. Mr Donald Reynolds is lethargic with poor mental status and appears uncomfortable. We have started low dose morphine drip and will transition him to full comfort measures. No more aggressive measures. Donald Reynolds agrees with the plan and is on the way in to the hospital.  Donald GreathousePraveen Brystol Wasilewski MD [facility] Pager [phone] If no answer or after 3pm call: [phone] 03/12/2017, 4:31 PM

## 2017-03-12 NOTE — Progress Notes (Signed)
Eveline KetoNephew Eric at bedside, provided funeral home information. Updated on patient condition and pending transfer to [id number].

## 2017-03-12 NOTE — Progress Notes (Signed)
PULMONARY / CRITICAL CARE MEDICINE   Name: Donald Reynolds MRN: [id number] DOB: 30-Jul-1925    ADMISSION DATE:  03/02/2017 CONSULTATION DATE: 03/11/2017  REFERRING MD: Mancel BaleElliott Wentz, MD  CHIEF COMPLAINT: Sepsis  HISTORY OF PRESENT ILLNESS:   83 y.o. male with known history of congestive heart failure, coronary artery disease, atrial fibrillation, seizure disorder, and baseline intellectual handicap. He does not have dementia. Patient presented from skilled nursing facility with increased confusion, anasarca, shock, and hypothermia. Clinical picture highly suspicious for sepsis. Patient was given empiric Candida biotics with vancomycin and Zosyn as well as bolus IV fluid. With worsening respiratory status he ultimately required endotracheal intubation after admission to the ICU. He also had central venous catheter placed for vasopressor infusion.  PAST MEDICAL HISTORY :  He  has a past medical history of Acute renal failure (HCC), Anemia, Arthritis, Atrial fibrillation (HCC), CHF (congestive heart failure) (HCC), Coronary artery disease, Deep venous thrombosis (HCC), Dementia, Diarrhea, Enlarged prostate, Heart murmur, Hiatal hernia (2003), History of seizure disorder, Hypertension, Hypokalemia, Nausea & vomiting, Seizures (HCC), and Vitiligo.  PAST SURGICAL HISTORY: He  has a past surgical history that includes Hernia repair.  No Known Allergies  No current facility-administered medications on file prior to encounter.    Current Outpatient Medications on File Prior to Encounter  Medication Sig  . Cholecalciferol (VITAMIN D) 2000 units CAPS Take 2,000 Units daily by mouth.   . dabigatran (PRADAXA) 150 MG CAPS capsule Take 1 capsule (150 mg total) by mouth every 12 (twelve) hours.  . fesoterodine (TOVIAZ) 4 MG TB24 tablet Take 4 mg by mouth daily.   . iron polysaccharides (NIFEREX) 150 MG capsule Take 150 mg by mouth daily.  Marland Kitchen. PHENobarbital (LUMINAL) 97.2 MG tablet Take 97.2 mg by mouth  daily.   . tamsulosin (FLOMAX) 0.4 MG CAPS capsule Take 0.4 mg by mouth daily.   . vitamin B-12 (CYANOCOBALAMIN) 500 MCG tablet Take 500 mcg by mouth daily.     FAMILY HISTORY:  His indicated that his mother is deceased. He indicated that his father is deceased. He indicated that his brother is deceased. He indicated that his maternal grandmother is deceased. He indicated that his maternal grandfather is deceased. He indicated that his paternal grandmother is deceased. He indicated that his paternal grandfather is deceased. He indicated that the status of his neg hx is unknown. He indicated that the status of his other is unknown.   SOCIAL HISTORY: He  reports that he has quit smoking. he has never used smokeless tobacco. He reports that he does not drink alcohol or use drugs.  REVIEW OF SYSTEMS:   Limited due to nonverbal. Denies chest pain, nausea or abdominal pain. Does endorse some shortness of breath.  SUBJECTIVE:  Afebrile overnight with stable vitals.  Maintaining saturation with nasal cannula.  Not requiring pressors.  Having significant secretions since extubation.  Unable to swallow medication safely.  VITAL SIGNS: BP 97/60   Pulse 75   Temp 97.7 F (36.5 C) (Axillary)   Resp 20   Ht 6' (1.829 m)   Wt 183 lb 3.2 oz (83.1 kg)   SpO2 94%   BMI 24.85 kg/m   HEMODYNAMICS:    VENTILATOR SETTINGS: Vent Mode: PSV;CPAP FiO2 (%):  [40 %] 40 % PEEP:  [5 cmH20] 5 cmH20 Pressure Support:  [5 cmH20-8 cmH20] 8 cmH20  INTAKE / OUTPUT: I/O last 3 completed shifts: In: 2760 [I.V.:230; NG/GT:1760; IV Piggyback:770] Out: 4560 [Urine:4560]  UOP: 0.7 L last 24 hours  PHYSICAL EXAMINATION: General: elderly male lying in bed, well developed, NAD with non-toxic appearance HEENT: normocephalic, atraumatic, moist mucous membranes Neck: supple, non-tender without lymphadenopathy Cardiovascular: regular rate and rhythm without murmurs, rubs, or gallops Lungs: diffuse rhonchi without  wheeze or rales bilaterally with normal work of breathing on [place] Abdomen: soft, non-tender, non-distended, normoactive bowel sounds Skin: warm, dry, no rashes or lesions, cap refill < 2 seconds Extremities: warm and well perfused, normal tone, chronic 1+ edema feet bilaterally Neuro: following commands, grossly intact throughout  LABS:  BMET Recent Labs  Lab 03/10/17 0345 03/10/17 1800 03/11/17 0530  NA 134* 132* 135  K 3.7 3.9 3.9  CL 98* 95* 96*  CO2 29 30 33*  BUN 35* 36* 41*  CREATININE 0.81 0.70 0.83  GLUCOSE 112* 107* 117*    Electrolytes Recent Labs  Lab 03/10/17 0345 03/10/17 1800 03/11/17 0530  CALCIUM 8.2* 8.0* 8.1*  MG 1.8 2.0 1.9  PHOS 2.1* 2.1* 2.1*    CBC Recent Labs  Lab 07-Apr-2017 0257 03/10/17 0345 03/11/17 0414  WBC 4.3 4.7 5.1  HGB 8.8* 8.7* 8.3*  HCT 24.9* 24.2* 23.6*  PLT 79* 85* 107*    Coag's Recent Labs  Lab 03/05/17 0932  APTT 104*  INR 1.72    Sepsis Markers Recent Labs  Lab 03/06/17 0414  PROCALCITON 1.32    ABG No results for input(s): PHART, PCO2ART, PO2ART in the last 168 hours.  Liver Enzymes Recent Labs  Lab 03/06/17 0414  03/10/17 0345 03/10/17 1800 03/11/17 0530  AST 25  --   --   --   --   ALT 30  --   --   --   --   ALKPHOS 92  --   --   --   --   BILITOT 1.0  --   --   --   --   ALBUMIN 2.3*  2.3*   < > 1.9* 1.7* 1.8*   < > = values in this interval not displayed.    Cardiac Enzymes No results for input(s): TROPONINI, PROBNP in the last 168 hours.  Glucose Recent Labs  Lab 03/11/17 0812 03/11/17 1130 03/11/17 1533 03/11/17 2022 03/11/17 2343 03/12/17 0406  GLUCAP 116* 116* 100* 84 82 81    Imaging No results found.  STUDIES:  PORTABLE CHEST 1 VIEW (04-07-2017): Worsening multilobar pneumonia, small to moderate left pleural effusions, mild cardiomegaly PORTABLE ABDOMEN - 1 VIEW (03/07/17): Mild ileus, NG tube in stomach PORTABLE CHEST 1 VIEW (03/07/17): Extensive bilateral airspace  disease right greater than left and small bilateral effusions unchanged PORTABLE ABDOMEN - 1 VIEW  (03/06/17): NG tube in stomach PORTABLE CHEST 1 VIEW (03/05/17): Concerning for pneumonia vs asymmetrical pulmonary edema, moderate size right pleural effusion layering posteriorly, left lower lobe atelectasis PORTABLE CHEST 1 VIEW (03/05/17): Airspace disease in right lung likely pneumonia versus asymmetrical edema with progression since previous study and probable small pleural effusions greater on right PORTABLE ABDOMEN - 1 VIEW (03/05/17): NG tube in place, right perihilar and basilar infiltration likely pneumonia, probable pleural effusions CHEST  2 VIEW (04-02-17): Cardiomegaly with hazy lower lung airspace opacity and small pleural effusions consistent with CHF, soft tissue enlargement superior mediastinum could reflect mass or adenopathy, consider follow-up chest CT  CULTURES: Respiratory culture (03/10/17): Moderate WBCs, predominantly PMNs, rare squames, no organisms though reincubated for better growth Blood culture (04/02/17): Negative, final Respiratory culture (03/05/17): Abundant WBC, predominantly PMNs, rare squames, few GPC in chains, few yeast with pseudohyphae, culture  few staph aureus and Candida albicans Urine culture (03/06/17): Negative, final  RVP PCR (02/23/2017): Negative MRSA PCR (03/05/17): Negative Urine streptococcal antigen (03/05/17): Negative Urine Legionella antigen (03/05/17): Negative Influenza A/B PCR (03/03/17): Negative  ANTIBIOTICS: Cefazolin11/23 >>  Ceftazidime 11/20-11/23 Azithromycin 11/20-11/21 Zosyn 11/19-11/20 Vancomycin 11/19-11/23  SIGNIFICANT EVENTS: 11/19 admission 11/20 worsening oxygenation requiring intubation 11/21 improving oxygenation and decreasing pressor support 11/23 off pressors given Lasix x1 11/24 increased secretions, given chest PT and more diuresis 11/25 more diuresis and checked trach aspirate culture due to secretions  positive for MSSA 11/26 extubated with persistent secretions  LINES/TUBES: ETT 11/20-11/26 LEJ 11/20 >> PIV R-forearm 11/19 >> PIV L-forearm 11/19 >> NG/OG 11/20-11/26 Urethral catheter 11/20 >>  DISCUSSION: Patient is an 83 year old male p/w acute hypoxic respiratory failure due to MSSA pneumonia requiring intubation.  ASSESSMENT / PLAN:  PULMONARY Acute hypoxic respiratory failure: Likely due to septic MSSA pneumonia Mucus secretions: Likely a result of MSSA pneumonia and intubation - Continue weaning [place] keeping O2 sats > 90%, continuous pulse ox - Scopolamine patch, chest PT - Morphine 2-4 mg IV Q3H PRN pain or agitation given decision for comfort measures if failed extubation with decision to not reintubate  CARDIOVASCULAR Chronic diastolic heart failure: Without signs of acute decompensation Bradycardia: Resolved H/O atrial fibrillation: Without arrhythmia Prolonged QTC: Resolved - Telemetry - Holding systemic anticoagulation and diuresis  RENAL Hyponatremia: Resolved Hypophosphatemia: Resolved Hypomagnesemia: Resolved - Intermittently checking renal function panel and magnesium level - KVO  GASTROINTESTINAL Nutrition - NPO, continuing TF per dietary recommendations  HEMATOLOGIC Thrombocytopenia: Improving, suspected splenic sequestration Anemia: Hemoglobin stable without signs of active bleeding - Trend CBC - SCDs and heparin SQ for DVT prophylaxis  INFECTIOUS Septic shock: Secondary to MSSA pneumonia - Continue cefazolin to complete 14-day course of treatment given sensitivities on tracheal aspirate  ENDOCRINE Borderline hypoglycemia: Resolved - CBG monitoring Q4H  NEUROLOGIC Agitation: Due to sedation for ventilator support, now resolved Urinary retention: No history of BPH Epilepsy: Without seizures - Holding home Flomax, continuing urinary cath - Continuing phenobarbital, seizure precautions   FAMILY  - Updates: No family at bedside this  morning - Interdisciplinary family meeting a palliative care meeting by: 03/11/17   Durward Parcelavid Makaya Juneau, DO [place]

## 2017-03-12 NOTE — Consult Note (Signed)
Consultation Note Date: 03/12/2017   Patient Name: Donald Reynolds  DOB: 09-29-1925  MRN: [id number]  Age / Sex: 83 y.o., male  PCP: System, Provider Not In Referring Physician: Roslynn AmbleNestor, Jennings E, MD  Reason for Consultation: Establishing goals of care, Hospice Evaluation and Terminal Care  HPI/Patient Profile: 83 y.o. male  with past medical history of CHF, CAD, DVT, Afib, and cognitive deficit who was admitted on 02/14/2017 with septic shock and respiratory failure secondary to CHF.  His immediate family has passed away and care of the patient (from a family perspective) was recently passed to the nephew, Minerva Areolaric.  The patient has been treated in the ICU with antibiotics, pressors and intubation.  On 11/26 he underwent 1 way extubation and was not expected to survive overnight.    PMT was consulted 11/27 to assist with family communication, symptom management and possibly hospice services.  Clinical Assessment and Goals of Care:  I have reviewed medical records including EPIC notes, labs and imaging, received report from CCM, assessed the patient and attempted to call his nephew, Minerva Areolaric, twice in order to diagnosis prognosis, GOC, EOL wishes, disposition and options.  On my exam the patient is lethargic but somewhat responsive.  He is in mitts and on oxygen at 5.5 liters.  He appears somewhat uncomfortable.  As I am unable to reach Minerva AreolaEric today I attempted to reach Dr. Abelardo DieselMcMullen, but discussed the patients care with both CCM MD and the bedside RN.  Patient is not expected to improve beyond his current state.  We decided to attempt to make the patient more comfortable with the removal of hand mitts, SCDs and monitoring.  Symptom management via morphine administration was adjusted to a low dose infusion (1 mg/hr) in an attempt to maintain an appropriate level of comfort.  PRN medications including ativan and robinul were  added to the orders.  Primary Decision Maker:  NEXT OF KIN nephew.    SUMMARY OF RECOMMENDATIONS     Attempted to reach nephew 2x.  Will continue to try.  Eliminate unnecessary monitoring.  Low dose morphine infusion with PRN boluses started for symptom management  Ativan and robinul added PRN for management of anxiety and secretions.  PMT will follow.  Code Status/Advance Care Planning:  DNR  Prognosis:  Anticipate hospital death in the next 24- 48 hours.   Discharge Planning: Anticipated Hospital Death      Primary Diagnoses: Present on Admission: . Sepsis (HCC)   I have reviewed the medical record, interviewed the patient and family, and examined the patient. The following aspects are pertinent.  Past Medical History:  Diagnosis Date  . Acute renal failure (HCC)   . Anemia   . Arthritis   . Atrial fibrillation (HCC)   . CHF (congestive heart failure) (HCC)   . Coronary artery disease   . Deep venous thrombosis (HCC)    Deep venous thrombosis prophylaxis  . Dementia   . Diarrhea    secondary to Salmonella species  . Enlarged prostate   .  Heart murmur   . Hiatal hernia 2003  . History of seizure disorder   . Hypertension   . Hypokalemia   . Nausea & vomiting   . Seizures (HCC)   . Vitiligo    Social History   Socioeconomic History  . Marital status: Single    Spouse name: None  . Number of children: None  . Years of education: None  . Highest education level: None  Social Needs  . Financial resource strain: None  . Food insecurity - worry: None  . Food insecurity - inability: None  . Transportation needs - medical: None  . Transportation needs - non-medical: None  Occupational History  . None  Tobacco Use  . Smoking status: Former Games developermoker  . Smokeless tobacco: Never Used  Substance and Sexual Activity  . Alcohol use: No  . Drug use: No  . Sexual activity: No    Birth control/protection: Abstinence  Other Topics Concern  . None    Social History Narrative  . None   Family History  Problem Relation Age of Onset  . Stroke Mother   . Hyperlipidemia Mother   . Hypertension Mother   . Heart attack Father   . Kidney Stones Father   . Heart disease Father   . Gout Brother   . Heart disease Brother   . Diabetes Other        nephew  . Colon cancer Neg Hx    Scheduled Meds: . scopolamine  1 patch Transdermal Q72H   Continuous Infusions: PRN Meds:.morphine injection No Known Allergies Review of Systems patient unable to speak  Physical Exam  Well developed male.  Very lethargic, unable to speak, poor dentition Resp sounds wet, on 6L of oxygen N/C Extremities with 2-3+ edema.  Vital Signs: BP 100/62   Pulse 88   Temp 97.9 F (36.6 C) (Axillary)   Resp 18   Ht 6' (1.829 m)   Wt 82.5 kg (181 lb 14.1 oz)   SpO2 (!) 88%   BMI 24.67 kg/m  Pain Assessment: CPOT   Pain Score: Asleep   SpO2: SpO2: (!) 88 % O2 Device:SpO2: (!) 88 % O2 Flow Rate: .O2 Flow Rate (L/min): 6 L/min  IO: Intake/output summary:   Intake/Output Summary (Last 24 hours) at 03/12/2017 1349 Last data filed at 03/12/2017 1000 Gross per 24 hour  Intake 680 ml  Output 785 ml  Net -105 ml    LBM: Last BM Date: 03/11/17 Baseline Weight: Weight: 79.4 kg (175 lb) Most recent weight: Weight: 82.5 kg (181 lb 14.1 oz)     Palliative Assessment/Data: 20%     Time In: 2:00 Time Out: 3:10 Time Total: 70 min. Greater than 50%  of this time was spent counseling and coordinating care related to the above assessment and plan.  Signed by: Norvel RichardsMarianne Roshni Burbano, PA-C Palliative Medicine Pager: [phone]  Please contact Palliative Medicine Team phone at [phone] for questions and concerns.  For individual provider: See Loretha StaplerAmion

## 2017-03-13 DIAGNOSIS — I5084 End stage heart failure: Secondary | ICD-10-CM

## 2017-03-13 LAB — CULTURE, RESPIRATORY: SPECIAL REQUESTS: NORMAL

## 2017-03-13 LAB — CULTURE, RESPIRATORY W GRAM STAIN: Culture: NORMAL

## 2017-03-13 MED ORDER — GLYCOPYRROLATE 0.2 MG/ML IJ SOLN
0.4000 mg | Freq: Three times a day (TID) | INTRAMUSCULAR | Status: DC
  Administered 2017-03-13 – 2017-03-14 (×4): 0.4 mg via INTRAVENOUS
  Filled 2017-03-13 (×4): qty 2

## 2017-03-13 NOTE — Progress Notes (Addendum)
Patient appears very comfortable.  Sleeping.  Apneic breathing.  No family at bedside.  PE Well developed elderly male, Resp: apneic, sounds wet, no distress - on 5.5L CV reg rate, irreg rhythm Abdomen soft. Extremities 3+ edema in each  Assessment:  End stage heart failure, actively dying.    Plan:  Continue comfort care.  Will schedule robinul and request that he be weaned from oxygen to room air today. I will attempt to call Minerva Areolaric  Prognosis:  Anticipated hospital death in 24-48 hours.  Norvel RichardsMarianne Josslin Sanjuan, PA-C Palliative Medicine Pager: [phone]  Total time 25 min.

## 2017-03-13 NOTE — Progress Notes (Signed)
[facility] PROGRESS NOTE  Donald Reynolds ZOX:[id number] DOB: [birth year] DOA: 03/10/2017  PCP: System, Provider Not In  Brief History/Interval Summary: 83 year old male with a past medical history of congestive heart failure, diastolic, history of coronary artery disease, atrial fibrillation, seizure disorder, baseline intellectual handicap who presented from skilled nursing facility with increasing confusion shock anasarca hypothermia.  Concern was for sepsis.  Patient was started on antibiotics and he was admitted in secure unit.  He had to be intubated.  He was started on vasopressors.  Patient was stabilized.  Discussions held with family.  He was made DNR.  Extubated.  Now he is under comfort care.  Reason for Visit: Acute hypoxic respiratory failure  Consultants: Pulmonology.  Palliative medicine  Procedures: Endotracheal intubation.  Central line placement.  Antibiotics: Currently off of antibiotics.  Subjective/Interval History: Patient seems comfortable.  Not awake.  ROS: Unable to do  Objective:  Vital Signs  Vitals:   03/12/17 1300 03/12/17 1400 03/12/17 1500 03/13/17 0440  BP: 100/62 (!) 91/58 (!) 89/64 95/61  Pulse: 88 80 94 71  Resp: 18 20 20 12   Temp:    97.7 F (36.5 C)  TempSrc:    Axillary  SpO2: (!) 88% (!) 74% (!) 61% 96%  Weight:      Height:        Intake/Output Summary (Last 24 hours) at 03/13/2017 1242 Last data filed at 03/13/2017 1025 Gross per 24 hour  Intake 11.75 ml  Output 500 ml  Net -488.25 ml   Filed Weights   03/09/17 0500 03/11/17 0417 03/12/17 0600  Weight: 84.2 kg (185 lb 10 oz) 83.1 kg (183 lb 3.2 oz) 82.5 kg (181 lb 14.1 oz)    General appearance: Unresponsive Resp: Shallow respirations.  There are few crackles at the bases. Cardio: regular rate and rhythm, S1, S2 normal, no murmur, click, rub or gallop GI: soft, non-tender; bowel sounds normal; no masses,  no organomegaly   Lab Results:  Data Reviewed: I  have personally reviewed following labs and imaging studies  CBC: Recent Labs  Lab 03/07/17 0316 03/08/17 0248 03/09/17 0257 03/10/17 0345 03/11/17 0414  WBC 9.0 7.5 4.3 4.7 5.1  NEUTROABS 8.5* 7.0 3.8 4.2 4.6  HGB 9.1* 8.9* 8.8* 8.7* 8.3*  HCT 25.9* 25.4* 24.9* 24.2* 23.6*  MCV 89.3 90.7 88.9 88.3 89.4  PLT 102* 89* 79* 85* 107*    Basic Metabolic Panel: Recent Labs  Lab 03/08/17 0248 03/09/17 0257 03/10/17 0345 03/10/17 1800 03/11/17 0530  NA 131* 133* 134* 132* 135  K 3.7 3.8 3.7 3.9 3.9  CL 100* 99* 98* 95* 96*  CO2 26 28 29 30  33*  GLUCOSE 118* 99 112* 107* 117*  BUN 30* 32* 35* 36* 41*  CREATININE 0.80 0.71 0.81 0.70 0.83  CALCIUM 8.2* 8.3* 8.2* 8.0* 8.1*  MG 2.0 2.0 1.8 2.0 1.9  PHOS 2.1* 2.0* 2.1* 2.1* 2.1*    GFR: Estimated Creatinine Clearance: 66.2 mL/min (by C-G formula based on SCr of 0.83 mg/dL).  Liver Function Tests: Recent Labs  Lab 03/08/17 0248 03/09/17 0257 03/10/17 0345 03/10/17 1800 03/11/17 0530  ALBUMIN 2.1* 1.9* 1.9* 1.7* 1.8*     CBG: Recent Labs  Lab 03/11/17 1533 03/11/17 2022 03/11/17 2343 03/12/17 0406 03/12/17 0801  GLUCAP 100* 84 82 81 73     Recent Results (from the past 240 hour(s))  Culture, blood (routine x 2)     Status: None   Collection Time: 03/11/2017  4:18 PM  Result Value Ref Range Status   Specimen Description BLOOD LEFT ANTECUBITAL  Final   Special Requests   Final    BOTTLES DRAWN AEROBIC AND ANAEROBIC Blood Culture results may not be optimal due to an excessive volume of blood received in culture bottles   Culture NO GROWTH 5 DAYS  Final   Report Status 03/09/2017 FINAL  Final  Culture, blood (routine x 2)     Status: None   Collection Time: Nov 16, 2016  4:21 PM  Result Value Ref Range Status   Specimen Description BLOOD RIGHT FOREARM  Final   Special Requests   Final    BOTTLES DRAWN AEROBIC AND ANAEROBIC Blood Culture results may not be optimal due to an excessive volume of blood received in  culture bottles   Culture NO GROWTH 5 DAYS  Final   Report Status 03/09/2017 FINAL  Final  Respiratory Panel by PCR     Status: None   Collection Time: Nov 16, 2016 11:29 PM  Result Value Ref Range Status   Adenovirus NOT DETECTED NOT DETECTED Final   Coronavirus 229E NOT DETECTED NOT DETECTED Final   Coronavirus HKU1 NOT DETECTED NOT DETECTED Final   Coronavirus NL63 NOT DETECTED NOT DETECTED Final   Coronavirus OC43 NOT DETECTED NOT DETECTED Final   Metapneumovirus NOT DETECTED NOT DETECTED Final   Rhinovirus / Enterovirus NOT DETECTED NOT DETECTED Final   Influenza A NOT DETECTED NOT DETECTED Final   Influenza B NOT DETECTED NOT DETECTED Final   Parainfluenza Virus 1 NOT DETECTED NOT DETECTED Final   Parainfluenza Virus 2 NOT DETECTED NOT DETECTED Final   Parainfluenza Virus 3 NOT DETECTED NOT DETECTED Final   Parainfluenza Virus 4 NOT DETECTED NOT DETECTED Final   Respiratory Syncytial Virus NOT DETECTED NOT DETECTED Final   Bordetella pertussis NOT DETECTED NOT DETECTED Final   Chlamydophila pneumoniae NOT DETECTED NOT DETECTED Final   Mycoplasma pneumoniae NOT DETECTED NOT DETECTED Final  MRSA PCR Screening     Status: None   Collection Time: 03/05/17  1:38 AM  Result Value Ref Range Status   MRSA by PCR NEGATIVE NEGATIVE Final    Comment:        The GeneXpert MRSA Assay (FDA approved for NASAL specimens only), is one component of a comprehensive MRSA colonization surveillance program. It is not intended to diagnose MRSA infection nor to guide or monitor treatment for MRSA infections.   Culture, respiratory (NON-Expectorated)     Status: None   Collection Time: 03/05/17  9:58 AM  Result Value Ref Range Status   Specimen Description TRACHEAL ASPIRATE  Final   Special Requests Normal  Final   Gram Stain   Final    ABUNDANT WBC PRESENT, PREDOMINANTLY PMN RARE SQUAMOUS EPITHELIAL CELLS PRESENT FEW GRAM POSITIVE COCCI IN CHAINS FEW YEAST WITH PSEUDOHYPHAE    Culture FEW  STAPHYLOCOCCUS AUREUS FEW CANDIDA ALBICANS   Final   Report Status 03/07/2017 FINAL  Final   Organism ID, Bacteria STAPHYLOCOCCUS AUREUS  Final      Susceptibility   Staphylococcus aureus - MIC*    CIPROFLOXACIN >=8 RESISTANT Resistant     ERYTHROMYCIN >=8 RESISTANT Resistant     GENTAMICIN <=0.5 SENSITIVE Sensitive     OXACILLIN 0.5 SENSITIVE Sensitive     TETRACYCLINE <=1 SENSITIVE Sensitive     VANCOMYCIN 1 SENSITIVE Sensitive     TRIMETH/SULFA <=10 SENSITIVE Sensitive     CLINDAMYCIN <=0.25 SENSITIVE Sensitive     RIFAMPIN <=0.5 SENSITIVE Sensitive     Inducible  Clindamycin NEGATIVE Sensitive     * FEW STAPHYLOCOCCUS AUREUS  Culture, Urine     Status: None   Collection Time: 03/06/17  8:50 AM  Result Value Ref Range Status   Specimen Description URINE, CATHETERIZED  Final   Special Requests Normal  Final   Culture NO GROWTH  Final   Report Status 03/07/2017 FINAL  Final  Culture, respiratory (NON-Expectorated)     Status: None (Preliminary result)   Collection Time: 03/10/17 12:39 PM  Result Value Ref Range Status   Specimen Description TRACHEAL ASPIRATE  Final   Special Requests Normal  Final   Gram Stain   Final    MODERATE WBC PRESENT, PREDOMINANTLY PMN RARE SQUAMOUS EPITHELIAL CELLS PRESENT NO ORGANISMS SEEN    Culture CULTURE REINCUBATED FOR BETTER GROWTH  Final   Report Status PENDING  Incomplete      Radiology Studies: No results found.   Medications:  Scheduled: . glycopyrrolate  0.4 mg Intravenous TID  . scopolamine  1 patch Transdermal Q72H  . sodium chloride flush  3 mL Intravenous Q12H   Continuous: . sodium chloride    . morphine 2 mg/hr (03/13/17 0814)   XBJ:[id number]:sodium chloride, acetaminophen **OR** acetaminophen, antiseptic oral rinse, glycopyrrolate **OR** glycopyrrolate **OR** glycopyrrolate, haloperidol **OR** haloperidol **OR** haloperidol lactate, LORazepam **OR** LORazepam **OR** LORazepam, morphine, polyvinyl alcohol, sodium chloride  flush  Assessment/Plan:  Active Problems:   Sepsis (HCC)   Septic shock (HCC)   Acute respiratory failure with hypoxia [facility](HCC)   Palliative care encounter   End stage heart failure (HCC)    Comfort care Patient under comfort care.  Most of his medications have been discontinued.  He is on a morphine infusion.  Palliative medicine is following.  Acute hypoxic respiratory failure Likely due to septic MSSA pneumonia.  Decision made for comfort care.  He is off of antibiotics.  Currently on morphine infusion.  Chronic diastolic heart failure/History of Atrial fibrillation Without signs of acute decompensation. Seems to be stable. Holding systemic anticoagulation and diuresis.  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  DVT Prophylaxis: Comfort care    Code Status: DNR Family Communication: No family at bedside Disposition Plan: Management as outlined above.  Anticipate a hospital death.    LOS: 9 days   Osvaldo ShipperGokul Shauntea Lok  [facility] Pager [phone] 03/13/2017, 12:42 PM  If 7PM-7AM, please contact night-coverage at [email or web address], password [facility]

## 2017-03-16 NOTE — Progress Notes (Signed)
Patient's nephew Donald Reynolds returned call.  Nurse informed him of the patient passing at 1308.  Will proceed with preparing patient for morgue.

## 2017-03-16 NOTE — Progress Notes (Signed)
[facility] PROGRESS NOTE  Donald BerlinWilliam E Reynolds WUJ:[id number] DOB: 07-22-1925 DOA: 03/07/2017  PCP: System, Provider Not In  Brief History/Interval Summary: 83 year old male with a past medical history of congestive heart failure, diastolic, history of coronary artery disease, atrial fibrillation, seizure disorder, baseline intellectual handicap who presented from skilled nursing facility with increasing confusion shock anasarca hypothermia.  Concern was for sepsis.  Patient was started on antibiotics and he was admitted in secure unit.  He had to be intubated.  He was started on vasopressors.  Patient was stabilized.  Discussions held with family.  He was made DNR.  Extubated.  Now he is comfort care.  Reason for Visit: Acute hypoxic respiratory failure  Consultants: Pulmonology.  Palliative medicine  Procedures: Endotracheal intubation.  Central line placement.  Antibiotics: Currently off of antibiotics.  Subjective/Interval History: Patient asleep.  Seems to be comfortable.  ROS: Unable to do  Objective:  Vital Signs  Vitals:   03/12/17 1400 03/12/17 1500 03/13/17 0440 05-29-16 0441  BP: (!) 91/58 (!) 89/64 95/61 117/69  Pulse: 80 94 71 83  Resp: 20 20 12 12   Temp:   97.7 F (36.5 C) 98.3 F (36.8 C)  TempSrc:   Axillary Oral  SpO2: (!) 74% (!) 61% 96% (!) 83%  Weight:      Height:        Intake/Output Summary (Last 24 hours) at May 20, 2016 0849 Last data filed at May 20, 2016 [id number] Gross per 24 hour  Intake 66.8 ml  Output 900 ml  Net -833.2 ml   Filed Weights   03/09/17 0500 03/11/17 0417 03/12/17 0600  Weight: 84.2 kg (185 lb 10 oz) 83.1 kg (183 lb 3.2 oz) 82.5 kg (181 lb 14.1 oz)    General appearance: Unresponsive Resp: Coarse breath sounds bilaterally.  Crackles noted. Cardio: S1-S2 normal regular.  No S3-S4 GI: Abdomen is soft  Lab Results:  Data Reviewed: I have personally reviewed following labs and imaging studies  CBC: Recent Labs  Lab  03/08/17 0248 03/09/17 0257 03/10/17 0345 03/11/17 0414  WBC 7.5 4.3 4.7 5.1  NEUTROABS 7.0 3.8 4.2 4.6  HGB 8.9* 8.8* 8.7* 8.3*  HCT 25.4* 24.9* 24.2* 23.6*  MCV 90.7 88.9 88.3 89.4  PLT 89* 79* 85* 107*    Basic Metabolic Panel: Recent Labs  Lab 03/08/17 0248 03/09/17 0257 03/10/17 0345 03/10/17 1800 03/11/17 0530  NA 131* 133* 134* 132* 135  K 3.7 3.8 3.7 3.9 3.9  CL 100* 99* 98* 95* 96*  CO2 26 28 29 30  33*  GLUCOSE 118* 99 112* 107* 117*  BUN 30* 32* 35* 36* 41*  CREATININE 0.80 0.71 0.81 0.70 0.83  CALCIUM 8.2* 8.3* 8.2* 8.0* 8.1*  MG 2.0 2.0 1.8 2.0 1.9  PHOS 2.1* 2.0* 2.1* 2.1* 2.1*    GFR: Estimated Creatinine Clearance: 66.2 mL/min (by C-G formula based on SCr of 0.83 mg/dL).  Liver Function Tests: Recent Labs  Lab 03/08/17 0248 03/09/17 0257 03/10/17 0345 03/10/17 1800 03/11/17 0530  ALBUMIN 2.1* 1.9* 1.9* 1.7* 1.8*     CBG: Recent Labs  Lab 03/11/17 1533 03/11/17 2022 03/11/17 2343 03/12/17 0406 03/12/17 0801  GLUCAP 100* 84 82 81 73     Recent Results (from the past 240 hour(s))  Culture, blood (routine x 2)     Status: None   Collection Time: 03/06/2017  4:18 PM  Result Value Ref Range Status   Specimen Description BLOOD LEFT ANTECUBITAL  Final   Special Requests   Final  BOTTLES DRAWN AEROBIC AND ANAEROBIC Blood Culture results may not be optimal due to an excessive volume of blood received in culture bottles   Culture NO GROWTH 5 DAYS  Final   Report Status 03/09/2017 FINAL  Final  Culture, blood (routine x 2)     Status: None   Collection Time: Mar 27, 2017  4:21 PM  Result Value Ref Range Status   Specimen Description BLOOD RIGHT FOREARM  Final   Special Requests   Final    BOTTLES DRAWN AEROBIC AND ANAEROBIC Blood Culture results may not be optimal due to an excessive volume of blood received in culture bottles   Culture NO GROWTH 5 DAYS  Final   Report Status 03/09/2017 FINAL  Final  Respiratory Panel by PCR     Status: None    Collection Time: 03/27/2017 11:29 PM  Result Value Ref Range Status   Adenovirus NOT DETECTED NOT DETECTED Final   Coronavirus 229E NOT DETECTED NOT DETECTED Final   Coronavirus HKU1 NOT DETECTED NOT DETECTED Final   Coronavirus NL63 NOT DETECTED NOT DETECTED Final   Coronavirus OC43 NOT DETECTED NOT DETECTED Final   Metapneumovirus NOT DETECTED NOT DETECTED Final   Rhinovirus / Enterovirus NOT DETECTED NOT DETECTED Final   Influenza A NOT DETECTED NOT DETECTED Final   Influenza B NOT DETECTED NOT DETECTED Final   Parainfluenza Virus 1 NOT DETECTED NOT DETECTED Final   Parainfluenza Virus 2 NOT DETECTED NOT DETECTED Final   Parainfluenza Virus 3 NOT DETECTED NOT DETECTED Final   Parainfluenza Virus 4 NOT DETECTED NOT DETECTED Final   Respiratory Syncytial Virus NOT DETECTED NOT DETECTED Final   Bordetella pertussis NOT DETECTED NOT DETECTED Final   Chlamydophila pneumoniae NOT DETECTED NOT DETECTED Final   Mycoplasma pneumoniae NOT DETECTED NOT DETECTED Final  MRSA PCR Screening     Status: None   Collection Time: 03/05/17  1:38 AM  Result Value Ref Range Status   MRSA by PCR NEGATIVE NEGATIVE Final    Comment:        The GeneXpert MRSA Assay (FDA approved for NASAL specimens only), is one component of a comprehensive MRSA colonization surveillance program. It is not intended to diagnose MRSA infection nor to guide or monitor treatment for MRSA infections.   Culture, respiratory (NON-Expectorated)     Status: None   Collection Time: 03/05/17  9:58 AM  Result Value Ref Range Status   Specimen Description TRACHEAL ASPIRATE  Final   Special Requests Normal  Final   Gram Stain   Final    ABUNDANT WBC PRESENT, PREDOMINANTLY PMN RARE SQUAMOUS EPITHELIAL CELLS PRESENT FEW GRAM POSITIVE COCCI IN CHAINS FEW YEAST WITH PSEUDOHYPHAE    Culture FEW STAPHYLOCOCCUS AUREUS FEW CANDIDA ALBICANS   Final   Report Status 03/07/2017 FINAL  Final   Organism ID, Bacteria STAPHYLOCOCCUS  AUREUS  Final      Susceptibility   Staphylococcus aureus - MIC*    CIPROFLOXACIN >=8 RESISTANT Resistant     ERYTHROMYCIN >=8 RESISTANT Resistant     GENTAMICIN <=0.5 SENSITIVE Sensitive     OXACILLIN 0.5 SENSITIVE Sensitive     TETRACYCLINE <=1 SENSITIVE Sensitive     VANCOMYCIN 1 SENSITIVE Sensitive     TRIMETH/SULFA <=10 SENSITIVE Sensitive     CLINDAMYCIN <=0.25 SENSITIVE Sensitive     RIFAMPIN <=0.5 SENSITIVE Sensitive     Inducible Clindamycin NEGATIVE Sensitive     * FEW STAPHYLOCOCCUS AUREUS  Culture, Urine     Status: None   Collection Time:  03/06/17  8:50 AM  Result Value Ref Range Status   Specimen Description URINE, CATHETERIZED  Final   Special Requests Normal  Final   Culture NO GROWTH  Final   Report Status 03/07/2017 FINAL  Final  Culture, respiratory (NON-Expectorated)     Status: None   Collection Time: 03/10/17 12:39 PM  Result Value Ref Range Status   Specimen Description TRACHEAL ASPIRATE  Final   Special Requests Normal  Final   Gram Stain   Final    MODERATE WBC PRESENT, PREDOMINANTLY PMN RARE SQUAMOUS EPITHELIAL CELLS PRESENT NO ORGANISMS SEEN    Culture Consistent with normal respiratory flora.  Final   Report Status 03/13/2017 FINAL  Final      Radiology Studies: No results found.   Medications:  Scheduled: . glycopyrrolate  0.4 mg Intravenous TID  . scopolamine  1 patch Transdermal Q72H  . sodium chloride flush  3 mL Intravenous Q12H   Continuous: . sodium chloride    . morphine 2 mg/hr (03/13/17 0814)   ONG:EXBMWUPRN:sodium chloride, acetaminophen **OR** acetaminophen, antiseptic oral rinse, glycopyrrolate **OR** glycopyrrolate **OR** glycopyrrolate, haloperidol **OR** haloperidol **OR** haloperidol lactate, LORazepam **OR** LORazepam **OR** LORazepam, morphine, polyvinyl alcohol, sodium chloride flush  Assessment/Plan:  Active Problems:   Sepsis (HCC)   Septic shock (HCC)   Acute respiratory failure with hypoxia [facility](HCC)   Palliative care  encounter   End stage heart failure (HCC)    Comfort care Patient under comfort care.  Most of his medications have been discontinued.  He is on a morphine infusion.  Palliative medicine is following.  No significant changes compared to yesterday.  Acute hypoxic respiratory failure Likely due to septic MSSA pneumonia.  Decision made for comfort care.  He is off of antibiotics.  He remains on morphine infusion.    Chronic diastolic heart failure/History of Atrial fibrillation Without signs of acute decompensation. Seems to be stable. Holding systemic anticoagulation and diuresis.  Thrombocytopenia Thought to be secondary to splenic sequestration.  Stable  Normocytic Anemia Hemoglobin stable without signs of active bleeding  Agitation Due to sedation for ventilator support. Now resolved  Epilepsy Without seizures.  DVT Prophylaxis: Comfort care    Code Status: DNR Family Communication: No family at bedside Disposition Plan: Management as outlined above.      LOS: 10 days   Donald Reynolds  [facility] Pager [phone] 17-Jan-2017, 8:49 AM  If 7PM-7AM, please contact night-coverage at [email or web address], password [email or web address]

## 2017-03-16 NOTE — Discharge Summary (Signed)
Death Summary  Donald Reynolds:[id number] DOB: [birth year] DOA: March 23, 2017   Admit date: March 23, 2017 Date of Death: 02-Apr-2017  Final Diagnoses:  Active Problems:   Sepsis (HCC)   Septic shock (HCC)   Acute respiratory failure with hypoxia (HCC)   Palliative care encounter   End stage heart failure (HCC)    History of present illness: 83 year old male with a past medical history of congestive heart failure, diastolic, history of coronary artery disease, atrial fibrillation, seizure disorder, baseline intellectual handicap who presented from skilled nursing facility with increasing confusion shock anasarca hypothermia.  Concern was for sepsis.  Patient was started on antibiotics and he was admitted in secure unit.  He had to be intubated.  He was started on vasopressors.  Patient was stabilized.  Discussions held with family.  He was made DNR.  Extubated.  Now he is comfort care.   Hospital Course:   Comfort care Patient under comfort care.  Most of his medications were discontinued.  He was placed on a morphine infusion.  Palliative medicine was following.  Acute hypoxic respiratory failure/Sepsis Likely due to septic MSSA pneumonia.  Decision made for comfort care.  He is off of antibiotics.    Chronic diastolic heart failure/History of Atrial fibrillation Without signs of acute decompensation.   Thrombocytopenia Thought to be secondary to splenic sequestration.    Normocytic Anemia Hemoglobin stable without signs of active bleeding  Agitation Due to sedation for ventilator support.   Epilepsy Without seizures during this hospitalization.  He passed away on 04-03-23 at 1:08 PM.    The results of significant diagnostics from this hospitalization (including imaging, microbiology, ancillary and laboratory) are listed below for reference.    Significant Diagnostic Studies: Dg Chest 2 View  Result Date: 03/23/2017 CLINICAL DATA:  Edema. EXAM: CHEST  2 VIEW  COMPARISON:  11/26/2015 FINDINGS: Cardiac silhouette is mildly enlarged. There is enlargement of the superior mediastinum increased when compared the prior chest radiograph. This may reflect a mass or adenopathy. It may be due to prominent vascularity or enlarged thyroid. No other evidence of mediastinal mass or adenopathy. There is interstitial thickening with hazy airspace opacity, the latter most evident in the right lung base. Small pleural effusions are noted. No pneumothorax. Skeletal structures are demineralized but grossly intact. IMPRESSION: 1. Cardiomegaly with interstitial thickening, hazy lower lung zone airspace opacity and small pleural effusions. Findings consistent with congestive heart failure. 2. Soft tissue enlargement of the superior mediastinum. This could reflect a mass or adenopathy. Consider follow-up chest CT for further assessment on a nonemergent basis. Electronically Signed   By: Amie Portland M.D.   On: 03/23/2017 17:19   Dg Chest Port 1 View  Result Date: 03/09/2017 CLINICAL DATA:  83 year old male with history of acute respiratory failure. EXAM: PORTABLE CHEST 1 VIEW COMPARISON:  Chest x-ray 03/07/2017. FINDINGS: An endotracheal tube is in place with tip 3.1 cm above the carina. There is a left-sided internal jugular central venous catheter with tip terminating in the distal innominate vein. A nasogastric tube is seen extending into the stomach, however, the tip of the nasogastric tube extends below the lower margin of the image. Worsening multifocal interstitial and airspace disease throughout the lungs bilaterally, most confluent throughout the entire right lung. In particular, the extent of airspace consolidation in the right upper lobe has significantly increased, with more apparent air bronchograms on today's study. Relative sparing of the left upper lobe. Small to moderate right pleural effusion. Small left pleural effusion. Pulmonary  vasculature does not appear engorged.  Mild cardiomegaly. Aortic atherosclerosis. IMPRESSION: 1. Support apparatus, as above. 2. Findings are most compatible with worsening multilobar pneumonia, as detailed above. 3. Small to moderate right and small left pleural effusions. 4. Mild cardiomegaly. 5. Aortic atherosclerosis. Electronically Signed   By: Trudie Reed M.D.   On: 03/09/2017 12:31   Dg Chest Port 1 View  Result Date: 03/07/2017 CLINICAL DATA:  Acute respiratory failure with hypoxia EXAM: PORTABLE CHEST 1 VIEW COMPARISON:  03/05/2017 FINDINGS: Endotracheal tube in good position unchanged. Central line in the SVC. NG enters the stomach. Extensive infiltrate throughout the right upper and right lower lobe unchanged. Moderate left lower lobe airspace disease. Small bilateral effusions unchanged. IMPRESSION: Support lines remain in good position Extensive bilateral airspace disease right greater than left and small bilateral effusions unchanged. Electronically Signed   By: Marlan Palau M.D.   On: 03/07/2017 06:34   Dg Chest Port 1 View  Result Date: 03/05/2017 CLINICAL DATA:  Sepsis, status post central line placement. EXAM: PORTABLE CHEST 1 VIEW COMPARISON:  Portable chest x-ray of earlier today FINDINGS: A left internal jugular venous catheter has been placed whose tip projects over the junction of the right and left brachiocephalic veins. The lungs are well-expanded. Confluent alveolar opacities are present in the right mid and lower lung. There is likely pleural fluid layering posteriorly on the right. The retrocardiac region on the left remains dense. The cardiac silhouette is enlarged. The pulmonary vascularity is not clearly engorged. There is calcification in the wall of the aortic arch. External pacemaker defibrillator pads are present. The endotracheal tube tip projects 4.2 cm above the carina. The esophagogastric tube tip projects below the GE junction but the proximal port may lie above the GE junction. IMPRESSION:  Confluent alveolar opacity in the right mid and lower lung worrisome for pneumonia though asymmetric pulmonary edema could produce a similar pattern. Probable moderate size right pleural effusion layering posteriorly. Probable left lower lobe atelectasis. Cardiomegaly without significant pulmonary vascular congestion. Thoracic aortic atherosclerosis. No postprocedure complication following left internal jugular venous catheter placement. Advancement of the esophagogastric tube by 10 cm would assure that the proximal port is positioned below the GE junction. Electronically Signed   By: David  [place] M.D.   On: 03/05/2017 07:17   Dg Chest Port 1 View  Result Date: 03/05/2017 CLINICAL DATA:  Endotracheal tube placement EXAM: PORTABLE CHEST 1 VIEW COMPARISON:  03/13/2017 FINDINGS: Endotracheal tube is been placed with tip measuring 4.8 cm above the carina. Enteric tube tip is off the field of view but below the left hemidiaphragm. Shallow inspiration. Mild cardiac enlargement. No vascular congestion. There is prominent perihilar airspace infiltration with patchy infiltrate in the left perihilar region. This could represent pneumonia or asymmetrical edema. There are probable bilateral pleural effusions, greater on the right. No pneumothorax. Calcification of the aorta. IMPRESSION: Appliances appear in satisfactory position. Cardiac enlargement. Airspace disease in the right lung, likely pneumonia. Also could consider asymmetrical edema. Progression since previous study. Probable small pleural effusions, greater on the right. Electronically Signed   By: Burman Nieves M.D.   On: 03/05/2017 04:19   Dg Abd Portable 1v  Result Date: 03/07/2017 CLINICAL DATA:  Orogastric tube placement. EXAM: PORTABLE ABDOMEN - 1 VIEW COMPARISON:  Abdominal radiograph yesterday. FINDINGS: Tip and side port of the enteric tube below the diaphragm in the stomach. Air-filled prominent central small bowel. Small to moderate colonic  stool burden. Oval 2.2 cm peripherally calcified density in the  right mid abdomen, likely gallstone. Pelvic phleboliths. IMPRESSION: Tip and side port of the enteric tube in the stomach. Air-filled prominent central small bowel suggests mild ileus. Electronically Signed   By: Rubye OaksMelanie  Ehinger M.D.   On: 03/07/2017 21:53   Dg Abd Portable 1v  Result Date: 03/06/2017 CLINICAL DATA:  NG placement. EXAM: PORTABLE ABDOMEN - 1 VIEW COMPARISON:  Earlier film from 03/05/2017. FINDINGS: The NG tube tip is in the body region the stomach. Persistent small effusions and bibasilar atelectasis. The abdominal bowel gas pattern is grossly normal. IMPRESSION: The NG tube tip is in the body region of the stomach. Electronically Signed   By: Rudie MeyerP.  Gallerani M.D.   On: 03/06/2017 18:46   Dg Abd Portable 1v  Result Date: 03/05/2017 CLINICAL DATA:  OG tube placement EXAM: PORTABLE ABDOMEN - 1 VIEW COMPARISON:  09/28/2008 FINDINGS: An enteric tube has been placed with tip in the left upper quadrant consistent with location in the upper stomach. Endotracheal tube tip is above the carina. Right perihilar and basilar infiltration likely representing pneumonia although asymmetrical edema could also have this appearance. Probable right pleural effusion. IMPRESSION: Enteric tube tip in the left upper quadrant consistent with location in the upper stomach. Right perihilar and basilar infiltration likely pneumonia. Probable pleural effusions. Electronically Signed   By: Burman NievesWilliam  Stevens M.D.   On: 03/05/2017 04:18    Microbiology: Recent Results (from the past 240 hour(s))  Culture, blood (routine x 2)     Status: None   Collection Time: 02/18/2017  4:18 PM  Result Value Ref Range Status   Specimen Description BLOOD LEFT ANTECUBITAL  Final   Special Requests   Final    BOTTLES DRAWN AEROBIC AND ANAEROBIC Blood Culture results may not be optimal due to an excessive volume of blood received in culture bottles   Culture NO GROWTH 5  DAYS  Final   Report Status 03/09/2017 FINAL  Final  Culture, blood (routine x 2)     Status: None   Collection Time: 03/03/2017  4:21 PM  Result Value Ref Range Status   Specimen Description BLOOD RIGHT FOREARM  Final   Special Requests   Final    BOTTLES DRAWN AEROBIC AND ANAEROBIC Blood Culture results may not be optimal due to an excessive volume of blood received in culture bottles   Culture NO GROWTH 5 DAYS  Final   Report Status 03/09/2017 FINAL  Final  Respiratory Panel by PCR     Status: None   Collection Time: 03/11/2017 11:29 PM  Result Value Ref Range Status   Adenovirus NOT DETECTED NOT DETECTED Final   Coronavirus 229E NOT DETECTED NOT DETECTED Final   Coronavirus HKU1 NOT DETECTED NOT DETECTED Final   Coronavirus NL63 NOT DETECTED NOT DETECTED Final   Coronavirus OC43 NOT DETECTED NOT DETECTED Final   Metapneumovirus NOT DETECTED NOT DETECTED Final   Rhinovirus / Enterovirus NOT DETECTED NOT DETECTED Final   Influenza A NOT DETECTED NOT DETECTED Final   Influenza B NOT DETECTED NOT DETECTED Final   Parainfluenza Virus 1 NOT DETECTED NOT DETECTED Final   Parainfluenza Virus 2 NOT DETECTED NOT DETECTED Final   Parainfluenza Virus 3 NOT DETECTED NOT DETECTED Final   Parainfluenza Virus 4 NOT DETECTED NOT DETECTED Final   Respiratory Syncytial Virus NOT DETECTED NOT DETECTED Final   Bordetella pertussis NOT DETECTED NOT DETECTED Final   Chlamydophila pneumoniae NOT DETECTED NOT DETECTED Final   Mycoplasma pneumoniae NOT DETECTED NOT DETECTED Final  MRSA PCR  Screening     Status: None   Collection Time: 03/05/17  1:38 AM  Result Value Ref Range Status   MRSA by PCR NEGATIVE NEGATIVE Final    Comment:        The GeneXpert MRSA Assay (FDA approved for NASAL specimens only), is one component of a comprehensive MRSA colonization surveillance program. It is not intended to diagnose MRSA infection nor to guide or monitor treatment for MRSA infections.   Culture,  respiratory (NON-Expectorated)     Status: None   Collection Time: 03/05/17  9:58 AM  Result Value Ref Range Status   Specimen Description TRACHEAL ASPIRATE  Final   Special Requests Normal  Final   Gram Stain   Final    ABUNDANT WBC PRESENT, PREDOMINANTLY PMN RARE SQUAMOUS EPITHELIAL CELLS PRESENT FEW GRAM POSITIVE COCCI IN CHAINS FEW YEAST WITH PSEUDOHYPHAE    Culture FEW STAPHYLOCOCCUS AUREUS FEW CANDIDA ALBICANS   Final   Report Status 03/07/2017 FINAL  Final   Organism ID, Bacteria STAPHYLOCOCCUS AUREUS  Final      Susceptibility   Staphylococcus aureus - MIC*    CIPROFLOXACIN >=8 RESISTANT Resistant     ERYTHROMYCIN >=8 RESISTANT Resistant     GENTAMICIN <=0.5 SENSITIVE Sensitive     OXACILLIN 0.5 SENSITIVE Sensitive     TETRACYCLINE <=1 SENSITIVE Sensitive     VANCOMYCIN 1 SENSITIVE Sensitive     TRIMETH/SULFA <=10 SENSITIVE Sensitive     CLINDAMYCIN <=0.25 SENSITIVE Sensitive     RIFAMPIN <=0.5 SENSITIVE Sensitive     Inducible Clindamycin NEGATIVE Sensitive     * FEW STAPHYLOCOCCUS AUREUS  Culture, Urine     Status: None   Collection Time: 03/06/17  8:50 AM  Result Value Ref Range Status   Specimen Description URINE, CATHETERIZED  Final   Special Requests Normal  Final   Culture NO GROWTH  Final   Report Status 03/07/2017 FINAL  Final  Culture, respiratory (NON-Expectorated)     Status: None   Collection Time: 03/10/17 12:39 PM  Result Value Ref Range Status   Specimen Description TRACHEAL ASPIRATE  Final   Special Requests Normal  Final   Gram Stain   Final    MODERATE WBC PRESENT, PREDOMINANTLY PMN RARE SQUAMOUS EPITHELIAL CELLS PRESENT NO ORGANISMS SEEN    Culture Consistent with normal respiratory flora.  Final   Report Status 03/13/2017 FINAL  Final     Labs: Basic Metabolic Panel: Recent Labs  Lab 03/08/17 0248 03/09/17 0257 03/10/17 0345 03/10/17 1800 03/11/17 0530  NA 131* 133* 134* 132* 135  K 3.7 3.8 3.7 3.9 3.9  CL 100* 99* 98* 95* 96*    CO2 26 28 29 30  33*  GLUCOSE 118* 99 112* 107* 117*  BUN 30* 32* 35* 36* 41*  CREATININE 0.80 0.71 0.81 0.70 0.83  CALCIUM 8.2* 8.3* 8.2* 8.0* 8.1*  MG 2.0 2.0 1.8 2.0 1.9  PHOS 2.1* 2.0* 2.1* 2.1* 2.1*   Liver Function Tests: Recent Labs  Lab 03/08/17 0248 03/09/17 0257 03/10/17 0345 03/10/17 1800 03/11/17 0530  ALBUMIN 2.1* 1.9* 1.9* 1.7* 1.8*   CBC: Recent Labs  Lab 03/08/17 0248 03/09/17 0257 03/10/17 0345 03/11/17 0414  WBC 7.5 4.3 4.7 5.1  NEUTROABS 7.0 3.8 4.2 4.6  HGB 8.9* 8.8* 8.7* 8.3*  HCT 25.4* 24.9* 24.2* 23.6*  MCV 90.7 88.9 88.3 89.4  PLT 89* 79* 85* 107*   CBG: Recent Labs  Lab 03/11/17 1533 03/11/17 2022 03/11/17 2343 03/12/17 0406 03/12/17 0801  GLUCAP  100* 84 82 81 73   Urinalysis    Component Value Date/Time   COLORURINE YELLOW 03/13/2017 1642   APPEARANCEUR HAZY (A) 03/03/2017 1642   LABSPEC 1.020 02/26/2017 1642   PHURINE 5.0 02/23/2017 1642   GLUCOSEU NEGATIVE 02/19/2017 1642   HGBUR LARGE (A) 03/05/2017 1642   HGBUR trace-intact 12/30/2009 1001   BILIRUBINUR NEGATIVE 02/24/2017 1642   KETONESUR NEGATIVE 03/03/2017 1642   PROTEINUR NEGATIVE 03/15/2017 1642   UROBILINOGEN 0.2 08/25/2014 0914   NITRITE NEGATIVE 03/07/2017 1642   LEUKOCYTESUR NEGATIVE 03/11/2017 1642    Oluwatamilore Starnes  [facility] 2017-03-31, 4:16 PM

## 2017-03-16 NOTE — Progress Notes (Signed)
Patient time of death [id number] on 03/03/2017.  Verified by Delman CheadlePatti Moss, RN.  Attempted to contact Daphine DeutscherAnne Ewer patient's sister at both numbers listed in emergency contact information with no answer.  Attempting to find contact info for Minerva Areolaric, patients nephew.  Will continue to attempt to contact next of kin.

## 2017-03-16 NOTE — Care Management Important Message (Signed)
Important Message  Patient Details  Name: Donald Reynolds MRN: [id number] Date of Birth: Oct 30, 1925   Medicare Important Message Given:  Yes    [name] 03/08/2017, 11:38 AM   Due to illness patient unable to sign.

## 2017-03-16 NOTE — Progress Notes (Signed)
Palliative Medicine RN Note: Arrived on unit to check symptoms; pt has just died & no family is present. Nurse has been unable to reach family. I attempted to call POA/nephew Minerva AreolaEric. No answer, left message to call the nurses station asap. His number is [phone].  Margret ChanceMelanie G. Tedi Hughson, RN, BSN, [facility] 03/04/2017 1:36 PM Cell [phone] 8:00-4:00 Monday-Friday Office [phone]

## 2017-03-16 NOTE — Progress Notes (Signed)
Responded to scc consult to support patient.  Patient sleeping and can not be disturbed today.  Chaplain will follow as needed.  Illana Nolting, [place], [place] [facility], Pager [phone]

## 2017-03-16 DEATH — deceased

## 2017-10-23 IMAGING — CT CT HEAD W/O CM
3 of 8 series · 13 of 47 positions shown, 15 images · non-contrast
Comparison: 10/11/2015

CLINICAL DATA: Unwitnessed fall. Laceration to top of the right
lip. No loss of consciousness.

EXAM:
CT HEAD WITHOUT CONTRAST
CT CERVICAL SPINE WITHOUT CONTRAST
TECHNIQUE: Multidetector CT imaging of the head and cervical spine was
performed following the standard protocol without intravenous
contrast. Multiplanar CT image reconstructions of the cervical spine
were also generated.

[Series 9: coronal · coronal · 0.28mm/px · 3 of 70 slices shown]
[im 26/70  brain]
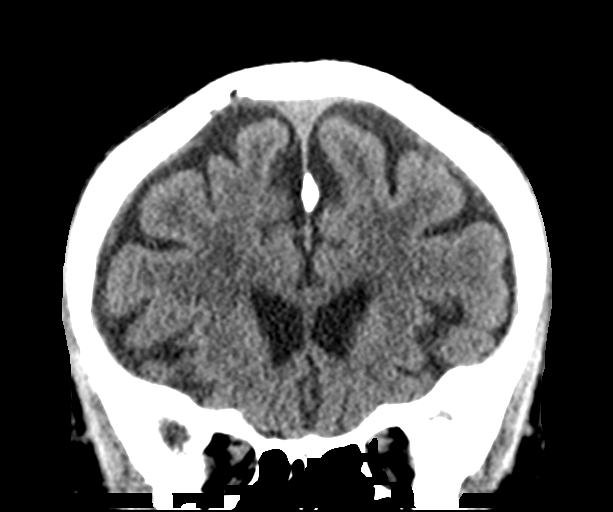
[im 35/70  brain]
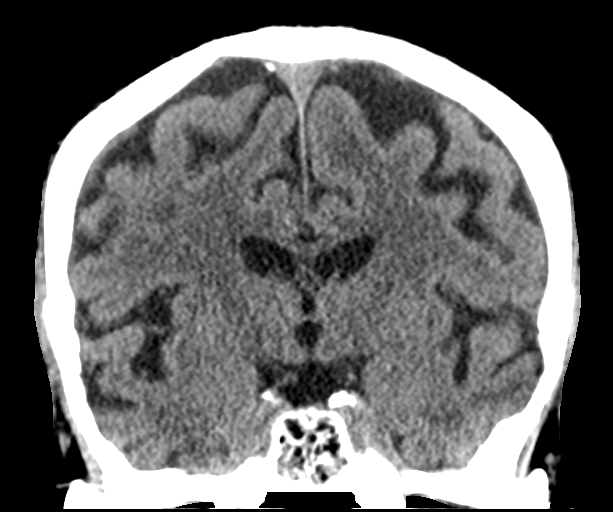
[im 44/70  brain]
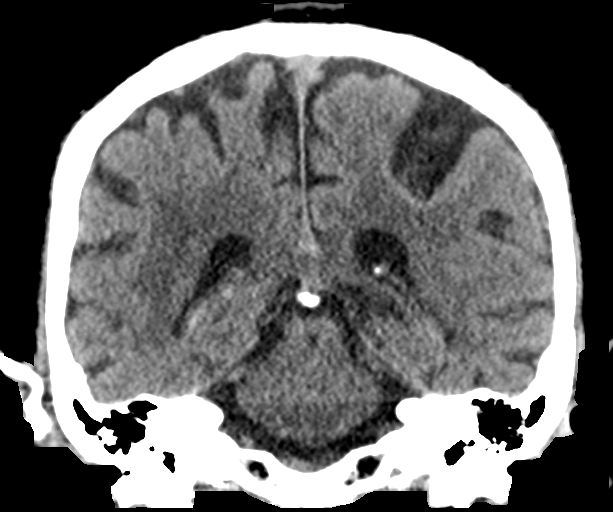

[Series 11: axial recon · axial · 0.23mm/px · z∈[+571,+730]mm · 8 of 103 slices shown, 10 images]
[im 11/103  brain]
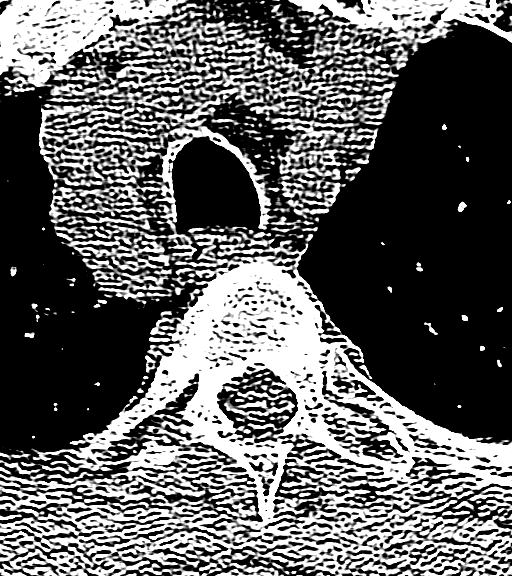
[im 11/103  bone]
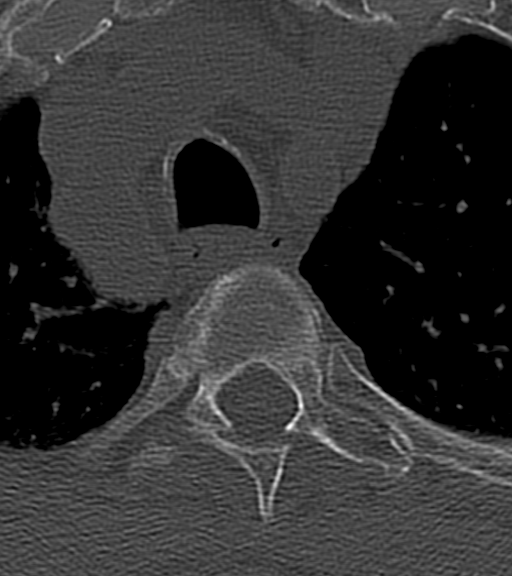
[im 21/103  brain]
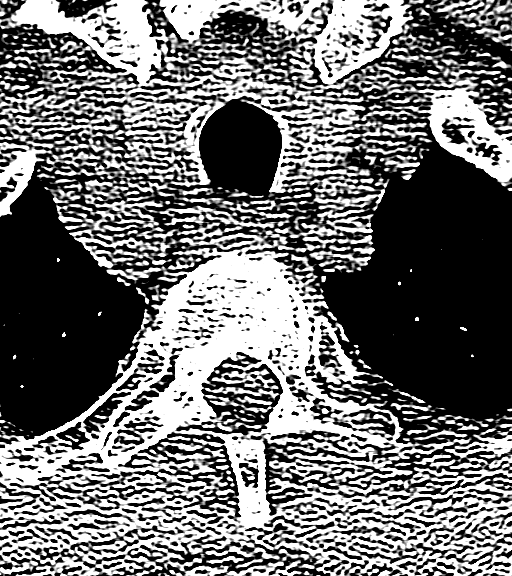
[im 31/103  brain]
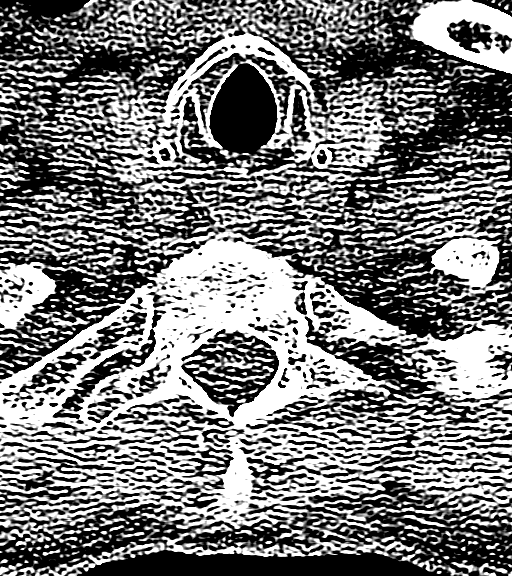
[im 41/103  brain]
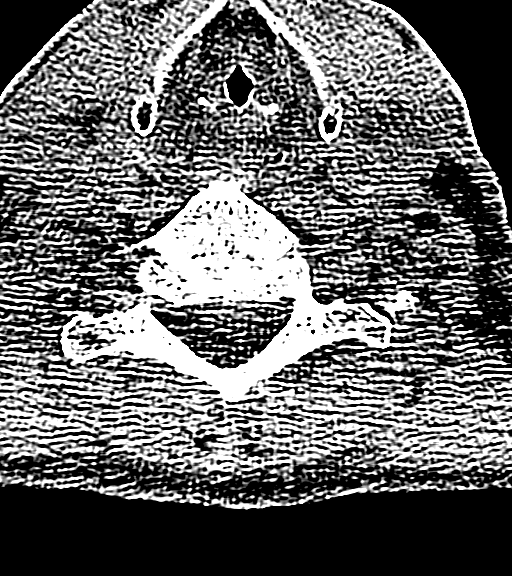
[im 62/103  brain]
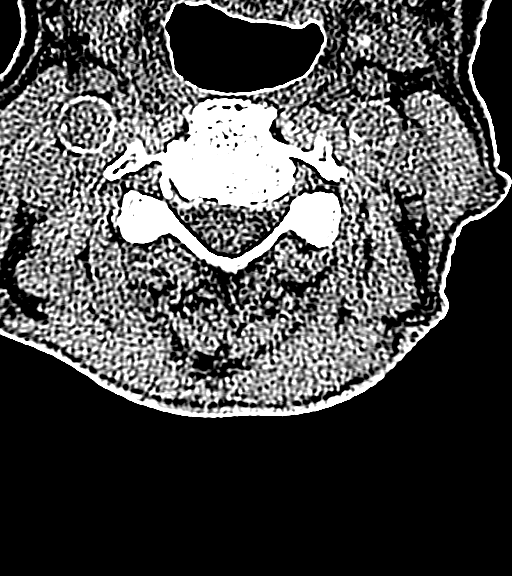
[im 62/103  bone]
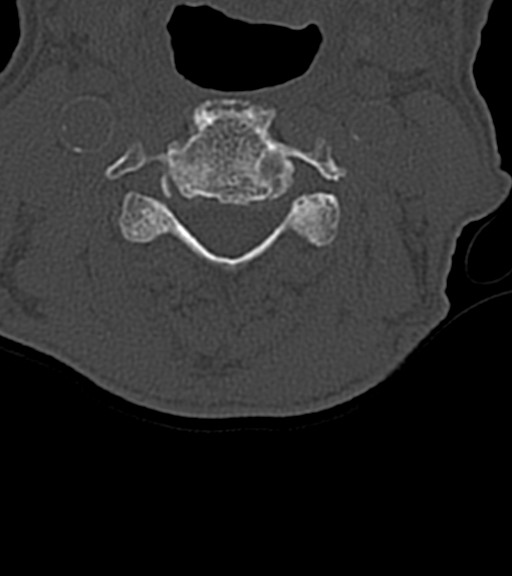
[im 72/103  brain]
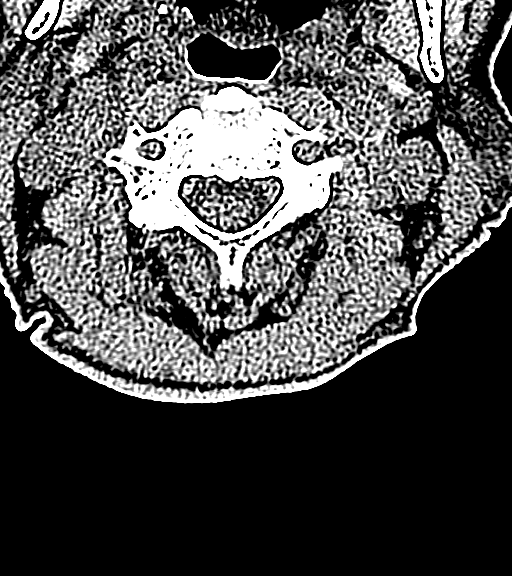
[im 82/103  brain]
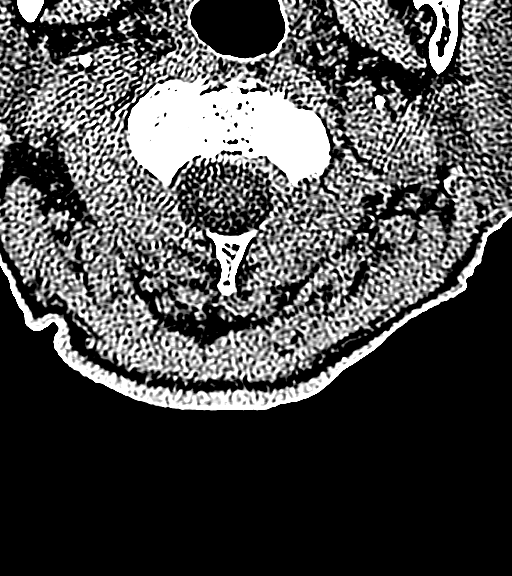
[im 92/103  brain]
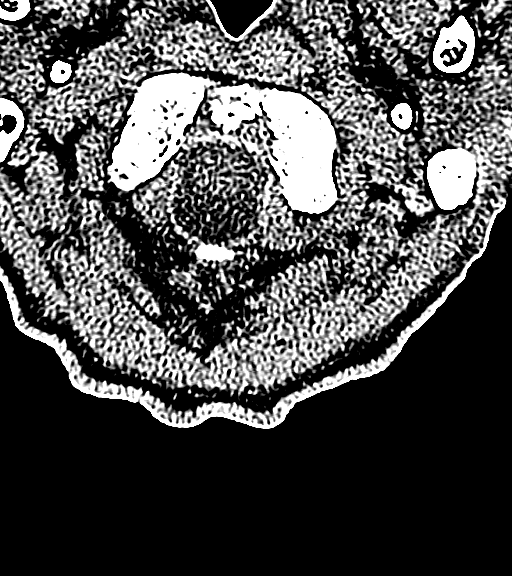

[Series 13: sagittal · sagittal · 0.36mm/px · 2 of 61 slices shown]
[im 21/61  brain]
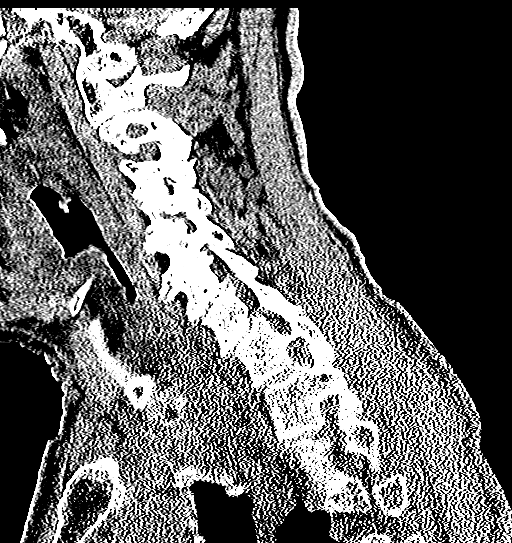
[im 41/61  brain]
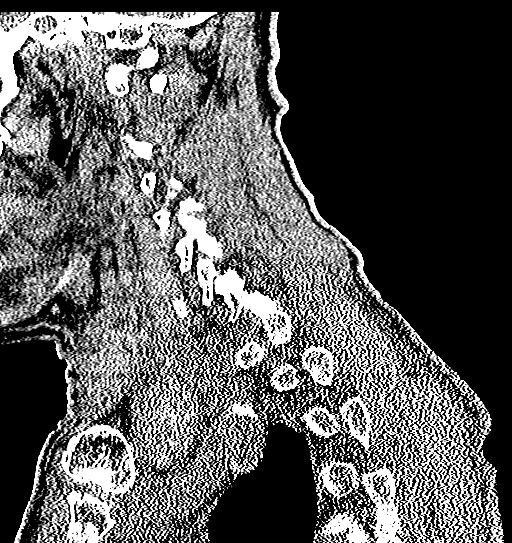

[13 of 47 positions shown; findings below may reference images not displayed]

FINDINGS: CT HEAD FINDINGS

BRAIN: There is sulcal and ventricular prominence consistent with
superficial and central atrophy. No intraparenchymal hemorrhage,
mass effect nor midline shift. Periventricular and subcortical white
matter hypodensities consistent with chronic small vessel ischemic
disease are identified. Idiopathic bilateral basal ganglial
calcifications. No acute large vascular territory infarcts. No
abnormal extra-axial fluid collections. Basal cisterns are not
effaced and midline.

VASCULAR: Moderate calcific atherosclerosis of the carotid siphons.

SKULL: No skull fracture. No significant scalp soft tissue swelling.

SINUSES/ORBITS: The mastoid air-cells are clear. The included
paranasal sinuses are well-aerated.The included ocular globes and
orbital contents are non-suspicious.

OTHER: Bilateral extracranial carotid arteriosclerosis.

CT CERVICAL SPINE FINDINGS

Alignment: Normal.

Skull base and vertebrae: No acute fracture. No primary bone lesion
or focal pathologic process.

Soft tissues and spinal canal: No prevertebral fluid or swelling. No
visible canal hematoma.

Disc levels: Cervical spondylosis with multilevel degenerative disc
disease from C3 through T1, most marked at C7-T1 were there is
minimal grade 1 anterolisthesis of C7 on T1. Facet joints are
aligned without jumped facets. Bilateral uncovertebral joint
osteoarthritis from C3 for through C6-7 bilaterally contributing to
bilateral neural foraminal mild-to-moderate encroachment. Ectasia of
the left vertebral artery at C4 on the left.

Upper chest: Mild pleuroparenchymal thickening and scarring at the
apices.

Other: None
IMPRESSION: Mild cerebral atrophy with chronic appearing small vessel ischemic
disease. No acute intracranial abnormality or fracture.

Cervical spondylosis without acute fracture or posttraumatic
subluxations.

## 2018-07-05 IMAGING — DX DG CHEST 2V
2 series · 2 of 2 positions shown · non-contrast
Comparison: 11/26/2015

CLINICAL DATA: Edema.

EXAM:
CHEST  2 VIEW

[chest lat]
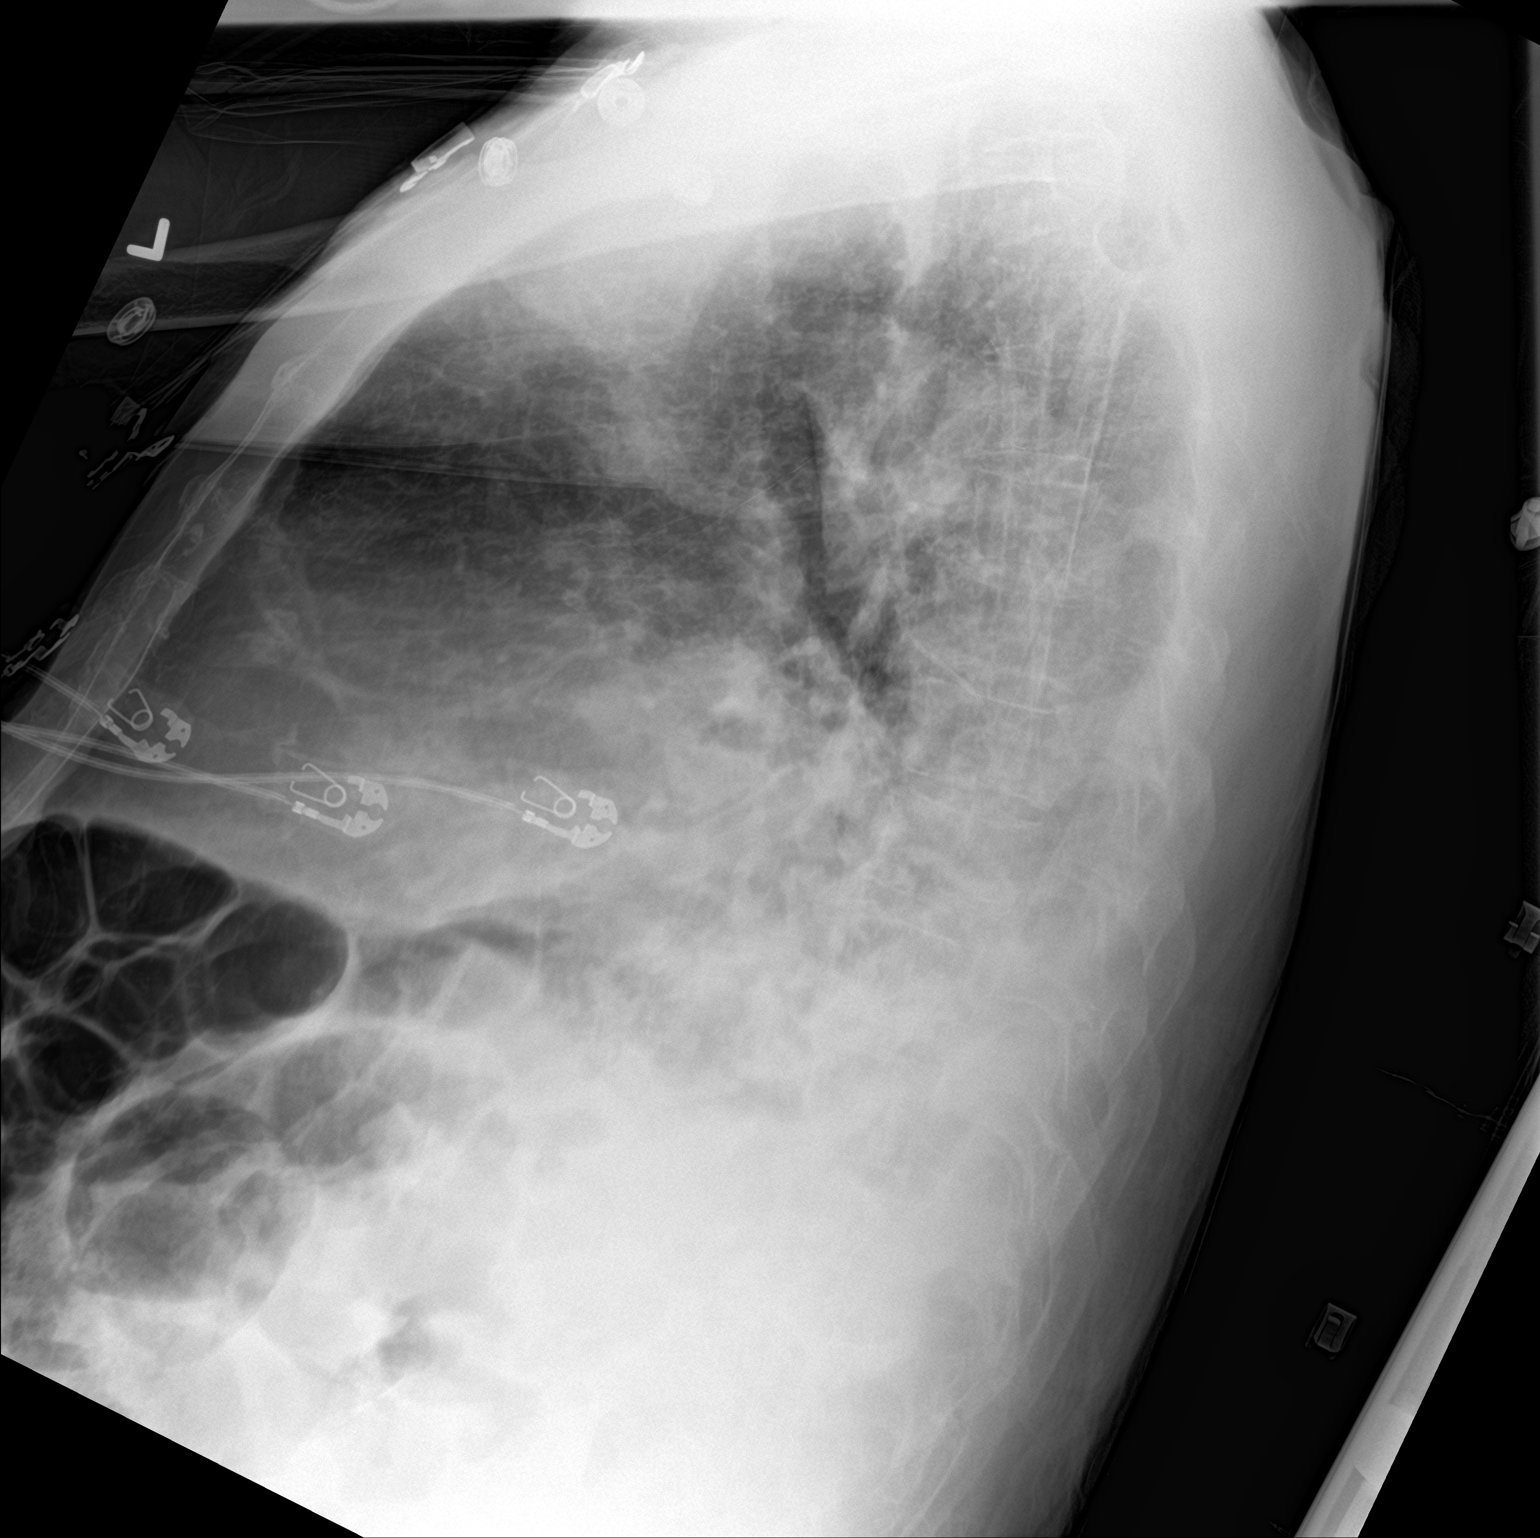

[chest ap]
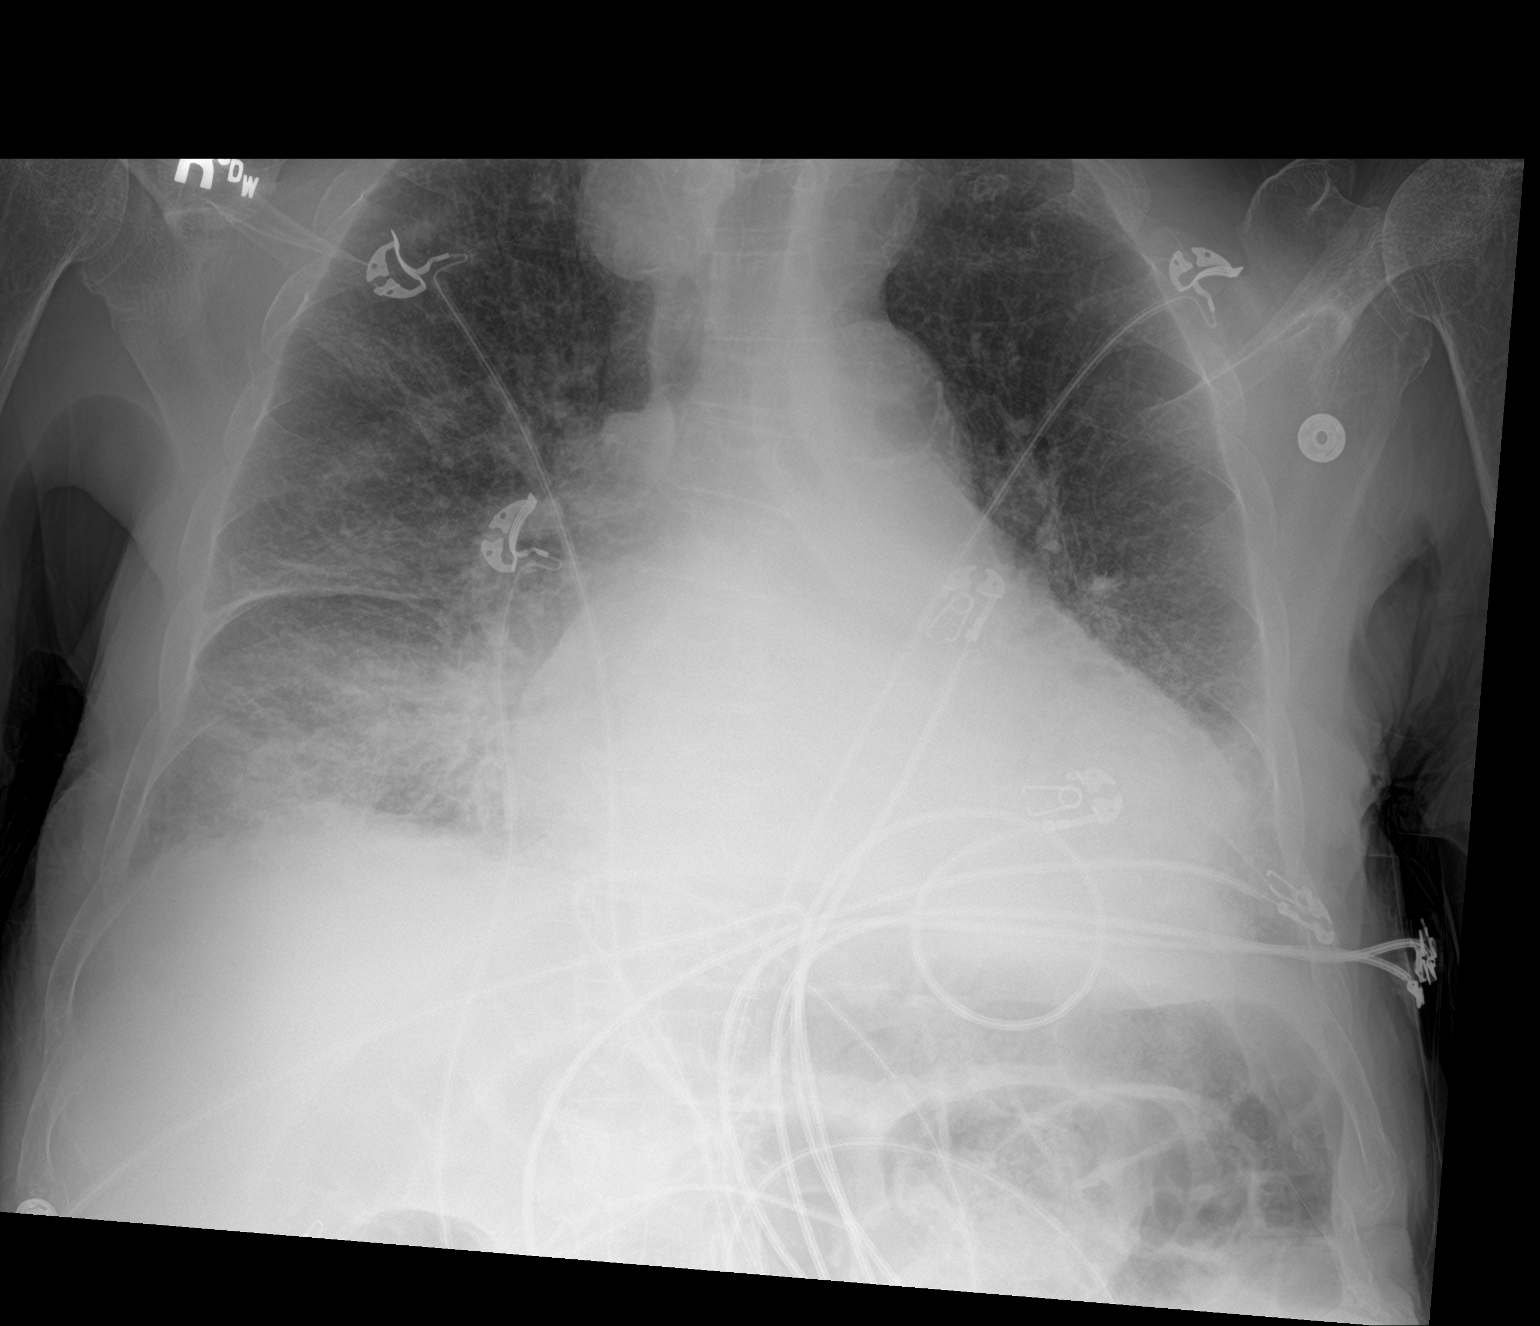

[2 of 2 positions shown; findings below may reference images not displayed]

FINDINGS: Cardiac silhouette is mildly enlarged. There is enlargement of the
superior mediastinum increased when compared the prior chest
radiograph. This may reflect a mass or adenopathy. It may be due to
prominent vascularity or enlarged thyroid. No other evidence of
mediastinal mass or adenopathy.

There is interstitial thickening with hazy airspace opacity, the
latter most evident in the right lung base. Small pleural effusions
are noted. No pneumothorax.

Skeletal structures are demineralized but grossly intact.
IMPRESSION: 1. Cardiomegaly with interstitial thickening, hazy lower lung zone
airspace opacity and small pleural effusions. Findings consistent
with congestive heart failure.
2. Soft tissue enlargement of the superior mediastinum. This could
reflect a mass or adenopathy. Consider follow-up chest CT for
further assessment on a nonemergent basis.

## 2018-07-06 IMAGING — DX DG CHEST 1V PORT
1 series · 1 of 1 positions shown · non-contrast
Comparison: 03/04/2017

CLINICAL DATA: Endotracheal tube placement

EXAM:
PORTABLE CHEST 1 VIEW

[chest ap]
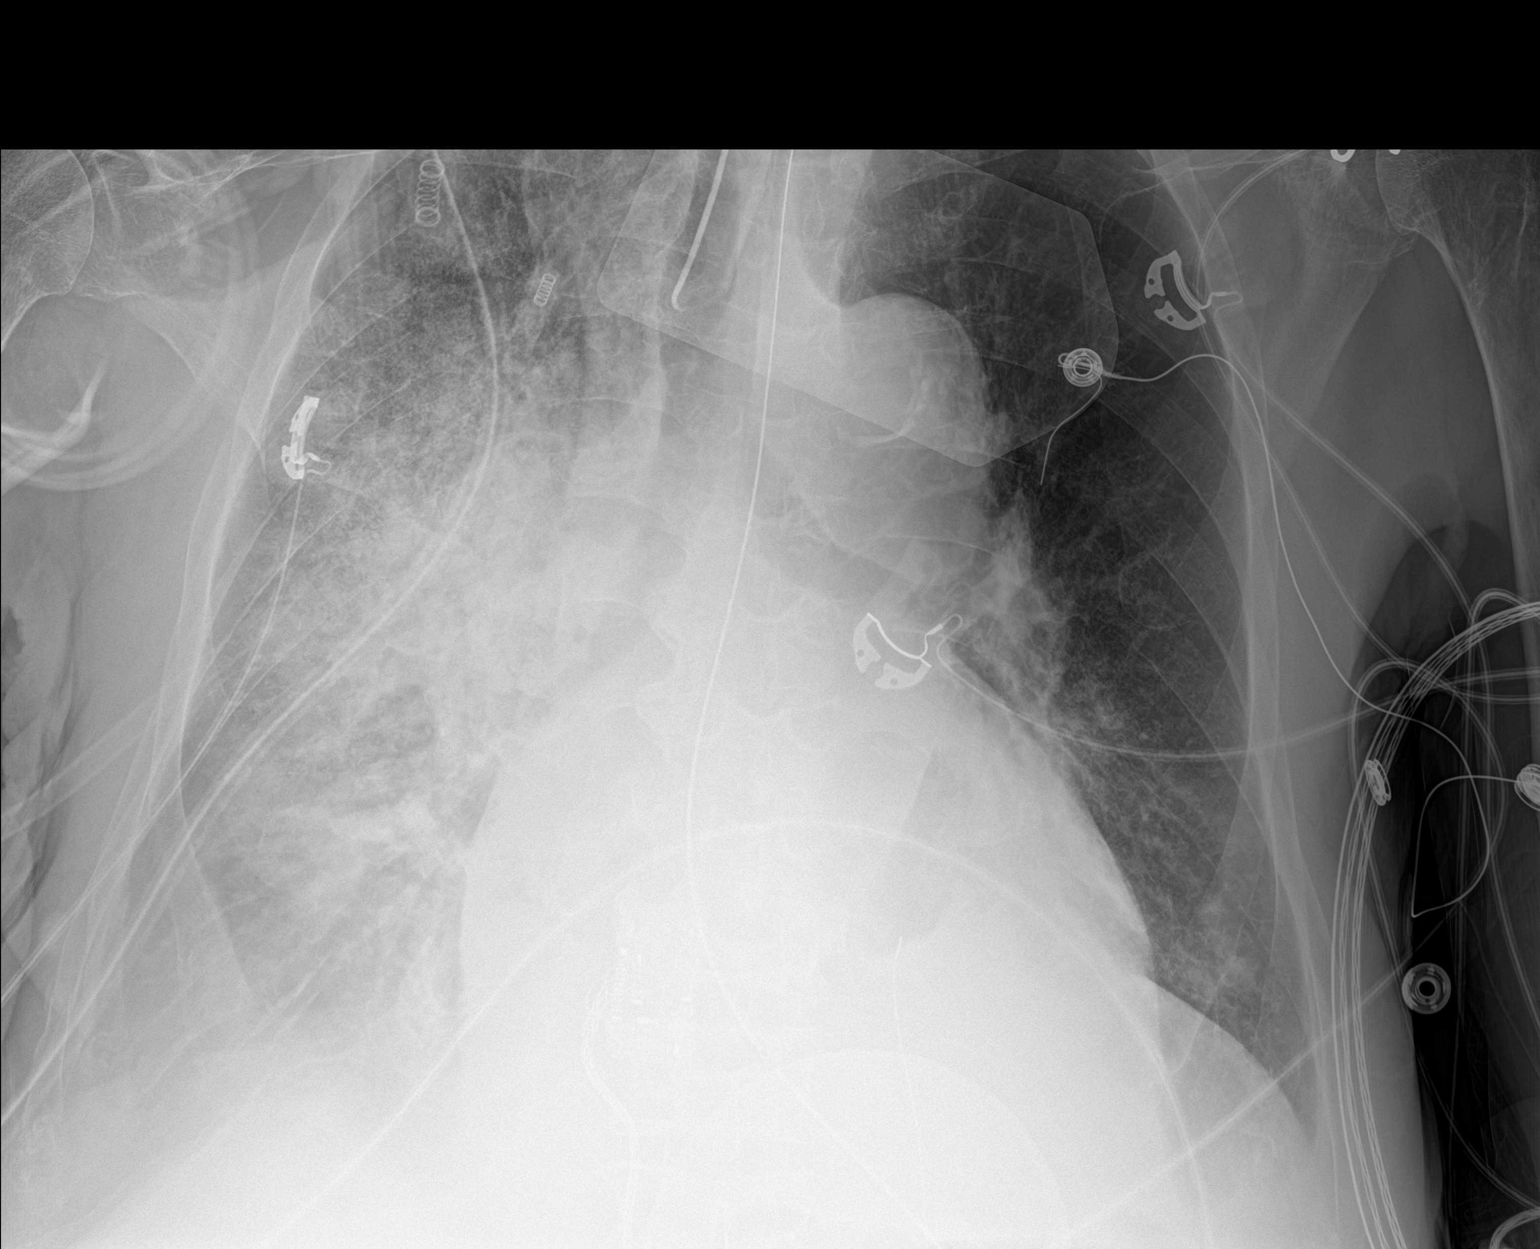

[1 of 1 positions shown; findings below may reference images not displayed]

FINDINGS: Endotracheal tube is been placed with tip measuring 4.8 cm above the
carina. Enteric tube tip is off the field of view but below the left
hemidiaphragm. Shallow inspiration. Mild cardiac enlargement. No
vascular congestion. There is prominent perihilar airspace
infiltration with patchy infiltrate in the left perihilar region.
This could represent pneumonia or asymmetrical edema. There are
probable bilateral pleural effusions, greater on the right. No
pneumothorax. Calcification of the aorta.
IMPRESSION: Appliances appear in satisfactory position. Cardiac enlargement.
Airspace disease in the right lung, likely pneumonia. Also could
consider asymmetrical edema. Progression since previous study.
Probable small pleural effusions, greater on the right.

## 2018-07-08 IMAGING — DX DG ABD PORTABLE 1V
1 series · 1 of 1 positions shown · non-contrast
Comparison: Abdominal radiograph yesterday.

CLINICAL DATA: Orogastric tube placement.

EXAM:
PORTABLE ABDOMEN - 1 VIEW

[abdomen]
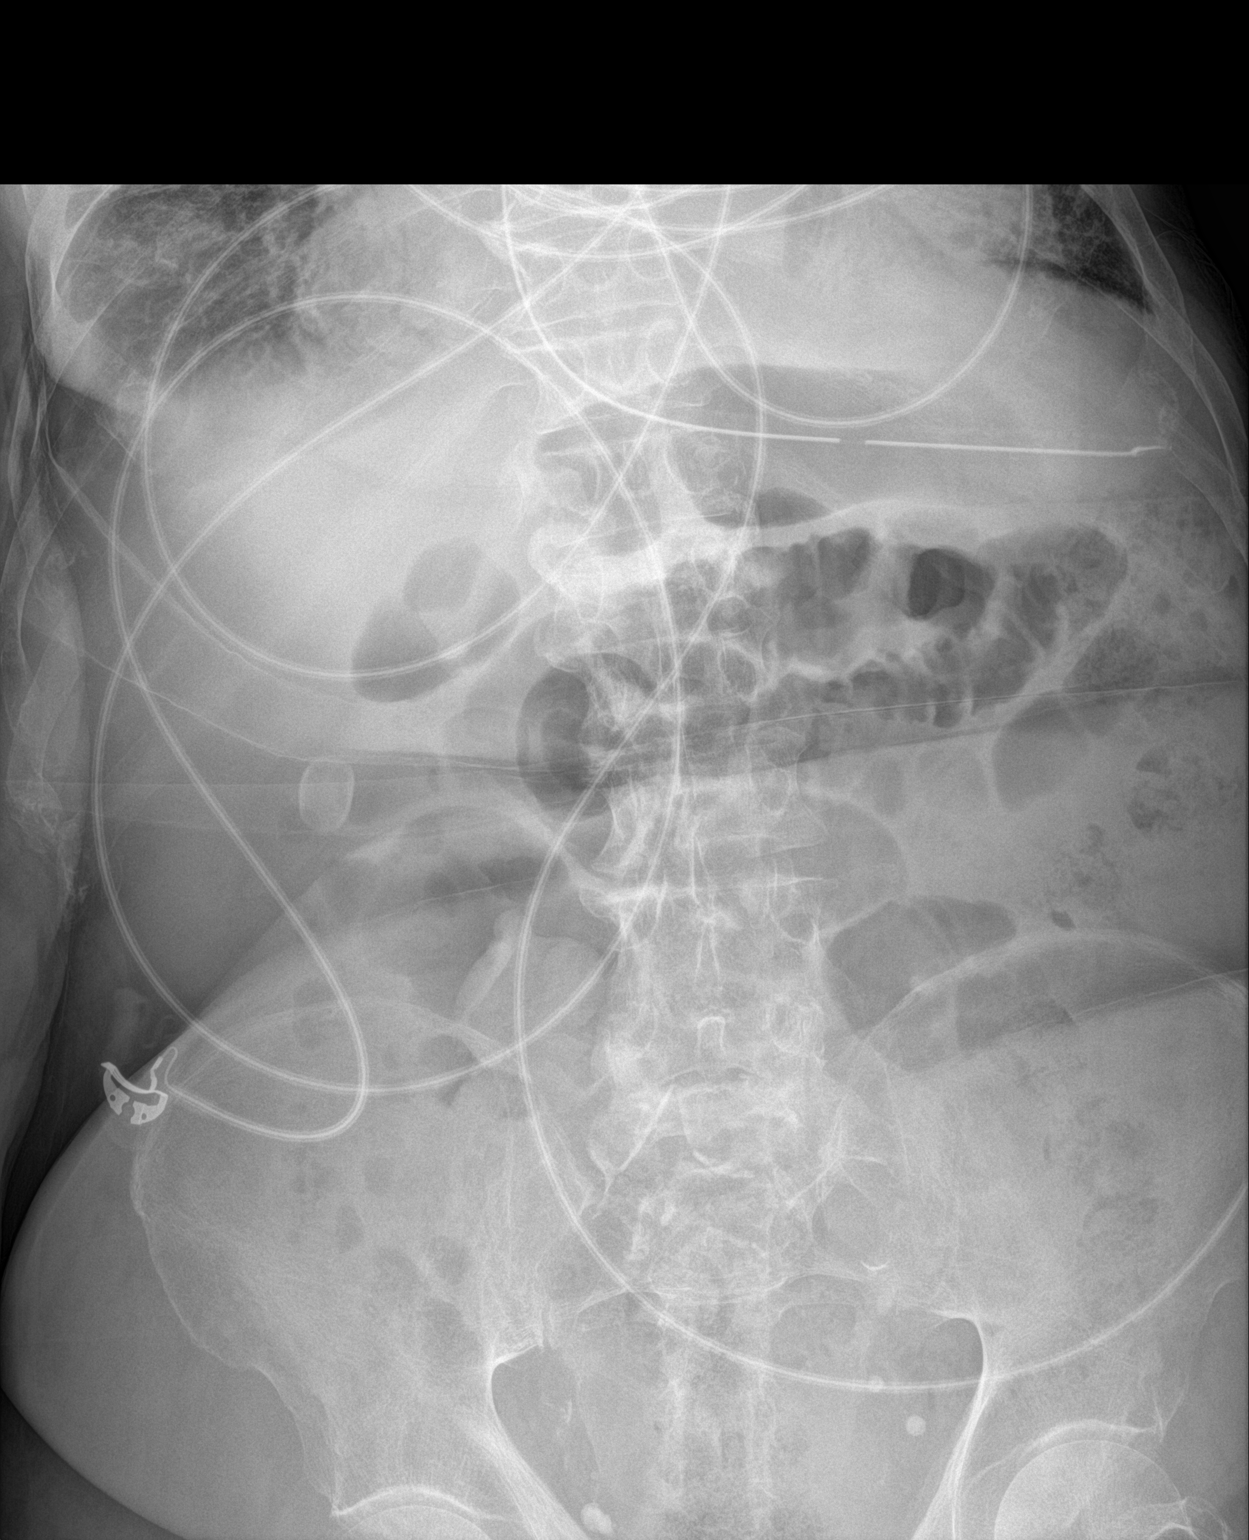

[1 of 1 positions shown; findings below may reference images not displayed]

FINDINGS: Tip and side port of the enteric tube below the diaphragm in the
stomach. Air-filled prominent central small bowel. Small to moderate
colonic stool burden. Oval 2.2 cm peripherally calcified density in
the right mid abdomen, likely gallstone. Pelvic phleboliths.
IMPRESSION: Tip and side port of the enteric tube in the stomach. Air-filled
prominent central small bowel suggests mild ileus.
